# Patient Record
Sex: Female | Born: 1955 | Race: Black or African American | Hispanic: No | Marital: Single | Smoking: Former smoker
Health system: Southern US, Community
[De-identification: ages and names within clinical notes are randomized; demographics above are authoritative.]

## PROBLEM LIST (undated history)

## (undated) DIAGNOSIS — J90 Pleural effusion, not elsewhere classified: Secondary | ICD-10-CM

## (undated) DIAGNOSIS — D259 Leiomyoma of uterus, unspecified: Secondary | ICD-10-CM

## (undated) HISTORY — PX: HERNIA REPAIR: SHX51

---

## 2017-11-26 ENCOUNTER — Other Ambulatory Visit: Payer: Self-pay

## 2017-11-26 ENCOUNTER — Emergency Department (HOSPITAL_BASED_OUTPATIENT_CLINIC_OR_DEPARTMENT_OTHER): Payer: Self-pay

## 2017-11-26 ENCOUNTER — Encounter (HOSPITAL_BASED_OUTPATIENT_CLINIC_OR_DEPARTMENT_OTHER): Payer: Self-pay | Admitting: *Deleted

## 2017-11-26 ENCOUNTER — Emergency Department (HOSPITAL_BASED_OUTPATIENT_CLINIC_OR_DEPARTMENT_OTHER)
Admission: EM | Admit: 2017-11-26 | Discharge: 2017-11-27 | Disposition: A | Discharge status: Home / Self Care | Payer: Self-pay | Attending: Emergency Medicine | Admitting: Emergency Medicine

## 2017-11-26 DIAGNOSIS — R1900 Intra-abdominal and pelvic swelling, mass and lump, unspecified site: Secondary | ICD-10-CM | POA: Insufficient documentation

## 2017-11-26 DIAGNOSIS — J90 Pleural effusion, not elsewhere classified: Secondary | ICD-10-CM | POA: Insufficient documentation

## 2017-11-26 DIAGNOSIS — Z87891 Personal history of nicotine dependence: Secondary | ICD-10-CM | POA: Insufficient documentation

## 2017-11-26 DIAGNOSIS — R180 Malignant ascites: Secondary | ICD-10-CM

## 2017-11-26 LAB — COMPREHENSIVE METABOLIC PANEL
ALBUMIN: 2.6 g/dL — AB (ref 3.5–5.0)
ALK PHOS: 51 U/L (ref 38–126)
ALT: 8 U/L — ABNORMAL LOW (ref 14–54)
AST: 14 U/L — AB (ref 15–41)
Anion gap: 8 (ref 5–15)
BILIRUBIN TOTAL: 0.5 mg/dL (ref 0.3–1.2)
BUN: 12 mg/dL (ref 6–20)
CALCIUM: 8.4 mg/dL — AB (ref 8.9–10.3)
CO2: 24 mmol/L (ref 22–32)
Chloride: 103 mmol/L (ref 101–111)
Creatinine, Ser: 0.89 mg/dL (ref 0.44–1.00)
GFR calc Af Amer: >60 mL/min (ref >60)
GFR calc non Af Amer: >60 mL/min (ref >60)
GLUCOSE: 108 mg/dL — AB (ref 65–99)
Potassium: 3.8 mmol/L (ref 3.5–5.1)
Sodium: 135 mmol/L (ref 135–145)
TOTAL PROTEIN: 9 g/dL — AB (ref 6.5–8.1)

## 2017-11-26 LAB — PROTIME-INR
INR: 1.13
PROTHROMBIN TIME: 14.4 s (ref 11.4–15.2)

## 2017-11-26 LAB — URINALYSIS, ROUTINE W REFLEX MICROSCOPIC
BILIRUBIN URINE: NEGATIVE
Glucose, UA: NEGATIVE mg/dL
Ketones, ur: 15 mg/dL — AB
LEUKOCYTES UA: NEGATIVE
Nitrite: NEGATIVE
PH: 5.5 (ref 5.0–8.0)
Protein, ur: NEGATIVE mg/dL
SPECIFIC GRAVITY, URINE: 1.02 (ref 1.005–1.030)

## 2017-11-26 LAB — URINALYSIS, MICROSCOPIC (REFLEX): WBC UA: NONE SEEN WBC/hpf (ref 0–5)

## 2017-11-26 LAB — CBC WITH DIFFERENTIAL/PLATELET
BASOS PCT: 0 %
Basophils Absolute: 0 10*3/uL (ref 0.0–0.1)
EOS ABS: 0 10*3/uL (ref 0.0–0.7)
Eosinophils Relative: 0 %
HCT: 28.9 % — ABNORMAL LOW (ref 36.0–46.0)
Hemoglobin: 8.8 g/dL — ABNORMAL LOW (ref 12.0–15.0)
LYMPHS PCT: 11 %
Lymphs Abs: 0.8 10*3/uL (ref 0.7–4.0)
MCH: 21.2 pg — AB (ref 26.0–34.0)
MCHC: 30.4 g/dL (ref 30.0–36.0)
MCV: 69.5 fL — AB (ref 78.0–100.0)
MONO ABS: 1.1 10*3/uL — AB (ref 0.1–1.0)
Monocytes Relative: 14 %
NEUTROS ABS: 5.6 10*3/uL (ref 1.7–7.7)
Neutrophils Relative %: 75 %
Platelets: 828 10*3/uL — ABNORMAL HIGH (ref 150–400)
RBC: 4.16 MIL/uL (ref 3.87–5.11)
RDW: 17.6 % — ABNORMAL HIGH (ref 11.5–15.5)
WBC: 7.5 10*3/uL (ref 4.0–10.5)

## 2017-11-26 LAB — BRAIN NATRIURETIC PEPTIDE: B Natriuretic Peptide: 26.7 pg/mL (ref 0.0–100.0)

## 2017-11-26 LAB — TROPONIN I: Troponin I: 0.08 ng/mL (ref <0.03)

## 2017-11-26 LAB — I-STAT CG4 LACTIC ACID, ED: Lactic Acid, Venous: 1.72 mmol/L (ref 0.5–1.9)

## 2017-11-26 MED ORDER — OXYCODONE-ACETAMINOPHEN 5-325 MG PO TABS
1.0000 | ORAL_TABLET | Freq: Once | ORAL | Status: AC
  Administered 2017-11-26: 1 via ORAL
  Filled 2017-11-26: qty 1

## 2017-11-26 MED ORDER — IOPAMIDOL (ISOVUE-370) INJECTION 76%
100.0000 mL | Freq: Once | INTRAVENOUS | Status: AC | PRN
  Administered 2017-11-26: 100 mL via INTRAVENOUS

## 2017-11-26 NOTE — ED Provider Notes (Signed)
[place] EMERGENCY DEPARTMENT Provider Note   CSN: [id number] Arrival date & time: 11/26/17  1830     History   Chief Complaint Chief Complaint  Patient presents with  . Shortness of Breath    HPI Tracy Flynn is a 62 y.o. female.  62 year old female who presents with shortness of breath and back pain.  Patient reports 1 week of progressively worsening shortness of breath as well as left upper back pain.  Shortness of breath is worse with exertion.  She denies any associated chest pain.  No significant cough and no fevers, vomiting, diarrhea, or abdominal pain.  She has some mild lower extremity edema that she has had before, her right leg is always slightly more swollen than her left.  She denies any recent travel, history of blood clots, history of cancer, or OCP use.  She does not follow with a PCP, takes herbs for her health.  She denies any alcohol, tobacco, or drug use.  When asked about her abdominal distention, she states that previously she had gotten big and try to lose weight and now has been trying to gain it back again which is why she explains that her abdomen is so big.  The history is provided by the patient.  Shortness of Breath     History reviewed. No pertinent past medical history.  There are no active problems to display for this patient.   Past Surgical History:  Procedure Laterality Date  . HERNIA REPAIR       OB History   None      Home Medications    Prior to Admission medications   Not on File    Family History History reviewed. No pertinent family history.  Social History Social History   Tobacco Use  . Smoking status: Former Research scientist (life sciences)  . Smokeless tobacco: Never Used  Substance Use Topics  . Alcohol use: Never    Frequency: Never  . Drug use: Never     Allergies   Codeine and Penicillins   Review of Systems Review of Systems  Respiratory: Positive for shortness of breath.    All other systems reviewed and are  negative except that which was mentioned in HPI   Physical Exam Updated Vital Signs BP (!) 180/95 (BP Location: Left Arm)   Pulse (!) 126   Temp 98.3 F (36.8 C)   Resp (!) 25   Ht 5\' 11"  (1.803 m)   Wt 69.4 kg (153 lb)   SpO2 94%   BMI 21.34 kg/m   Physical Exam  Constitutional: She is oriented to person, place, and time. She appears well-developed. No distress.  HENT:  Head: Normocephalic and atraumatic.  Moist mucous membranes  Eyes: Conjunctivae are normal.  Neck: Neck supple.  Cardiovascular: Regular rhythm and normal heart sounds. Tachycardia present.  No murmur heard. Pulmonary/Chest: Effort normal. No accessory muscle usage. Tachypnea noted. She has no wheezes. She has no rales.  Near absent breath sounds in left lung,  Abdominal: Soft. Bowel sounds are normal. She exhibits distension. There is no tenderness.  Distended abdomen with fluid wave, non-tender  Musculoskeletal:       Right lower leg: She exhibits edema.       Left lower leg: She exhibits edema.  1+ pitting edema BLE R>L   Neurological: She is alert and oriented to person, place, and time.  Fluent speech  Skin: Skin is warm and dry.  Psychiatric: Judgment normal.  Flat affect  Nursing note and vitals  reviewed.    ED Treatments / Results  Labs (all labs ordered are listed, but only abnormal results are displayed) Labs Reviewed  COMPREHENSIVE METABOLIC PANEL - Abnormal; Notable for the following components:      Result Value   Glucose, Bld 108 (*)    Calcium 8.4 (*)    Total Protein 9.0 (*)    Albumin 2.6 (*)    AST 14 (*)    ALT 8 (*)    All other components within normal limits  TROPONIN I - Abnormal; Notable for the following components:   Troponin I 0.08 (*)    All other components within normal limits  CBC WITH DIFFERENTIAL/PLATELET - Abnormal; Notable for the following components:   Hemoglobin 8.8 (*)    HCT 28.9 (*)    MCV 69.5 (*)    MCH 21.2 (*)    RDW 17.6 (*)    Platelets 828  (*)    Monocytes Absolute 1.1 (*)    All other components within normal limits  URINALYSIS, ROUTINE W REFLEX MICROSCOPIC - Abnormal; Notable for the following components:   Hgb urine dipstick MODERATE (*)    Ketones, ur 15 (*)    All other components within normal limits  URINALYSIS, MICROSCOPIC (REFLEX) - Abnormal; Notable for the following components:   Bacteria, UA RARE (*)    All other components within normal limits  PROTIME-INR  BRAIN NATRIURETIC PEPTIDE  I-STAT CG4 LACTIC ACID, ED    EKG EKG Interpretation  Date/Time:  Thursday November 26 2017 18:46:00 EDT Ventricular Rate:  125 PR Interval:  148 QRS Duration: 70 QT Interval:  322 QTC Calculation: 464 R Axis:   16 Text Interpretation:  Sinus tachycardia Nonspecific T wave abnormality Abnormal ECG No previous ECGs available Confirmed by Theotis Burrow [phone]) on 11/26/2017 6:48:11 PM   Radiology Dg Chest 2 View  Result Date: 11/26/2017 CLINICAL DATA:  Shortness of breath, dry cough and left chest pain for 1 week. EXAM: CHEST - 2 VIEW COMPARISON:  None. FINDINGS: Complete whiteout of the left chest with mediastinal shift to the right. Right lung is clear. Cardiomediastinal predominantly obscured. No pneumothorax. Scoliosis. Soft tissue planes and included osseous structures are non suspicious. IMPRESSION: Complete white out of left chest with mediastinal shift to the right consistent with effusion and potential underlying mass. Findings would be better assessed on contrast enhanced CT. Acute findings discussed with and reconfirmed by Dr.RACHEL LITTLE on 11/26/2017 at 7:30 pm. Electronically Signed   By: Elon Alas M.D.   On: 11/26/2017 19:30   Ct Angio Chest Pe W/cm &/or Wo Cm  Result Date: 11/26/2017 CLINICAL DATA:  Initial evaluation for possible pulmonary embolism, abdominal swelling. EXAM: CT ANGIOGRAPHY CHEST CT ABDOMEN AND PELVIS WITH CONTRAST TECHNIQUE: Multidetector CT imaging of the chest was performed using the  standard protocol during bolus administration of intravenous contrast. Multiplanar CT image reconstructions and MIPs were obtained to evaluate the vascular anatomy. Multidetector CT imaging of the abdomen and pelvis was performed using the standard protocol during bolus administration of intravenous contrast. CONTRAST:  162mL ISOVUE-370 IOPAMIDOL (ISOVUE-370) INJECTION 76% COMPARISON:  Prior chest radiograph from earlier the same day. FINDINGS: CTA CHEST FINDINGS Cardiovascular: Intrathoracic aorta of normal caliber without aneurysm or other acute abnormality. Visualized great vessels within normal limits. Heart and mediastinal structures shifted to the right. Heart size within normal limits. No definite pericardial effusion. Evaluation of the pulmonary arterial tree somewhat limited due to large left pleural effusion and motion artifact. Evaluation for possible  distal small emboli on this exam limited. Main pulmonary artery within normal limits for caliber measuring 2.6 cm in diameter. No central or large segmental filling defects seen within the right lung to suggest embolus. There is centrally complete collapse of the left lung, which is contracted towards the left hilum. The left pulmonary arteries are largely decompressed. No large central filling defects seen within the left main and proximal pulmonary arteries. Re-formatted imaging confirms these findings. Mediastinum/Nodes: Mediastinal structures shifted to the right due to the large left pleural effusion. Thyroid within normal limits. No definite enlarged mediastinal or hilar adenopathy, although evaluation somewhat limited. No enlarged axillary nodes. Esophagus grossly within normal limits. Lungs/Pleura: Tracheobronchial tree deviated to the right but remains patent as do the bronchi supplying the right lung there is compression of the distal left mainstem and segmental bronchi at the left hilum with complete compression of the left lung. Large pleural  effusion fills the left hemithorax. Effusion measures simple fluid density without internal complexity or loculation. Smaller layering right pleural effusion present as well. The pneumatized right lung is otherwise clear without infiltrate or edema. No pneumothorax. No visible pulmonary nodule or mass. Musculoskeletal: Anasarca noted. Dextroscoliosis with exaggeration of the normal thoracic kyphosis. Degenerative changes noted within the lower cervical spine. No acute osseous abnormality. No worrisome lytic or blastic osseous lesions. Review of the MIP images confirms the above findings. CT ABDOMEN and PELVIS FINDINGS Hepatobiliary: Multiple heterogeneous hypodense lesions present within the liver, largest of which is hypodense with irregular nodular enhancement measuring 3.5 x 4.0 x 1.9 cm (series 13, image 14). Findings are indeterminate, but concerning for possible metastatic disease. Irregular subcapsular lesions with irregularity of the overlying right hepatic contour noted. Periportal edema. Gallbladder within normal limits. No appreciable biliary dilatation. Pancreas: Pancreas within normal limits. Spleen: Spleen displaced anteriorly but otherwise unremarkable. Adrenals/Urinary Tract: Adrenal glands grossly within normal limits. Kidneys equal in size with symmetric enhancement. Few scattered hypodense lesions within the left kidney too small the characterize. No nephrolithiasis, hydronephrosis, or focal enhancing renal mass. No appreciable hydroureter. Partially distended bladder demonstrates no acute abnormality. Stomach/Bowel: Stomach within normal limits. Small paraumbilical hernia containing short segment loop of small bowel and free fluid noted without associated obstruction. No definite acute inflammatory changes seen about the bowels. Vascular/Lymphatic: Normal intravascular enhancement seen throughout the intra-abdominal aorta. Mesenteric vessels patent proximally. Multiple mesenteric lymph nodes  and/or peritoneal implants present within the right mid abdomen, measuring up to 12 mm (series 13, image 38). Shoddy subcentimeter retroperitoneal lymph nodes noted. Reproductive: Large partially solid/partially cystic mass position within the central pelvis and lower abdomen measures 17.6 x 20.0 x 20.0 cm (series 13, image 63). Lesion demonstrates internal cystic components with superimposed large eccentric solid slight soft tissue components. Scattered internal calcifications. 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 Additional right adnexal cystic lesions measure up to 3.7 cm (series 15, image 76), indeterminate. Uterus seen inferiorly and is grossly within normal limits. The native ovaries not well delineated. Other: Large volume free fluid throughout the abdomen and pelvis, measuring simple fluid density. No free intraperitoneal air. Musculoskeletal: Diffuse anasarca. Patient overall somewhat cachectic in appearance. Scoliosis with advanced facet arthrosis noted within the lower lumbar spine. No acute osseous abnormality. No discrete lytic or blastic osseous lesions. Review of the MIP images confirms the above findings. IMPRESSION: CTA CHEST IMPRESSION 1. Mildly limited study due to large left pleural effusion and motion artifact. No CTA evidence for large or central pulmonary embolus. Evaluation for possible  distal small emboli 2. Large left pleural effusion with complete collapse of the left lung with left-to-right mediastinal shift. 3. Smaller layering right pleural effusion. CT ABDOMEN AND PELVIS IMPRESSION 1. 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 2. Associated large volume ascites throughout the abdomen and pelvis. 3. Multiple heterogeneous hepatic lesions, concerning for possible metastatic disease. 4. Few scattered mildly enlarged mesenteric lymph nodes, which could be reactive or  reflect nodal metastases. 5. Additional smaller right adnexal cystic lesions measuring up to 3.7 cm, indeterminate. 6. Anasarca. Electronically Signed   By: Jeannine Boga M.D.   On: 11/26/2017 21:53   Ct Abdomen Pelvis W Contrast  Result Date: 11/26/2017 CLINICAL DATA:  Initial evaluation for possible pulmonary embolism, abdominal swelling. EXAM: CT ANGIOGRAPHY CHEST CT ABDOMEN AND PELVIS WITH CONTRAST TECHNIQUE: Multidetector CT imaging of the chest was performed using the standard protocol during bolus administration of intravenous contrast. Multiplanar CT image reconstructions and MIPs were obtained to evaluate the vascular anatomy. Multidetector CT imaging of the abdomen and pelvis was performed using the standard protocol during bolus administration of intravenous contrast. CONTRAST:  173mL ISOVUE-370 IOPAMIDOL (ISOVUE-370) INJECTION 76% COMPARISON:  Prior chest radiograph from earlier the same day. FINDINGS: CTA CHEST FINDINGS Cardiovascular: Intrathoracic aorta of normal caliber without aneurysm or other acute abnormality. Visualized great vessels within normal limits. Heart and mediastinal structures shifted to the right. Heart size within normal limits. No definite pericardial effusion. Evaluation of the pulmonary arterial tree somewhat limited due to large left pleural effusion and motion artifact. Evaluation for possible distal small emboli on this exam limited. Main pulmonary artery within normal limits for caliber measuring 2.6 cm in diameter. No central or large segmental filling defects seen within the right lung to suggest embolus. There is centrally complete collapse of the left lung, which is contracted towards the left hilum. The left pulmonary arteries are largely decompressed. No large central filling defects seen within the left main and proximal pulmonary arteries. Re-formatted imaging confirms these findings. Mediastinum/Nodes: Mediastinal structures shifted to the right due to the  large left pleural effusion. Thyroid within normal limits. No definite enlarged mediastinal or hilar adenopathy, although evaluation somewhat limited. No enlarged axillary nodes. Esophagus grossly within normal limits. Lungs/Pleura: Tracheobronchial tree deviated to the right but remains patent as do the bronchi supplying the right lung there is compression of the distal left mainstem and segmental bronchi at the left hilum with complete compression of the left lung. Large pleural effusion fills the left hemithorax. Effusion measures simple fluid density without internal complexity or loculation. Smaller layering right pleural effusion present as well. The pneumatized right lung is otherwise clear without infiltrate or edema. No pneumothorax. No visible pulmonary nodule or mass. Musculoskeletal: Anasarca noted. Dextroscoliosis with exaggeration of the normal thoracic kyphosis. Degenerative changes noted within the lower cervical spine. No acute osseous abnormality. No worrisome lytic or blastic osseous lesions. Review of the MIP images confirms the above findings. CT ABDOMEN and PELVIS FINDINGS Hepatobiliary: Multiple heterogeneous hypodense lesions present within the liver, largest of which is hypodense with irregular nodular enhancement measuring 3.5 x 4.0 x 1.9 cm (series 13, image 14). Findings are indeterminate, but concerning for possible metastatic disease. Irregular subcapsular lesions with irregularity of the overlying right hepatic contour noted. Periportal edema. Gallbladder within normal limits. No appreciable biliary dilatation. Pancreas: Pancreas within normal limits. Spleen: Spleen displaced anteriorly but otherwise unremarkable. Adrenals/Urinary Tract: Adrenal  glands grossly within normal limits. Kidneys equal in size with symmetric enhancement. Few scattered hypodense lesions within the left kidney too small the characterize. No nephrolithiasis, hydronephrosis, or focal enhancing renal mass. No  appreciable hydroureter. Partially distended bladder demonstrates no acute abnormality. Stomach/Bowel: Stomach within normal limits. Small paraumbilical hernia containing short segment loop of small bowel and free fluid noted without associated obstruction. No definite acute inflammatory changes seen about the bowels. Vascular/Lymphatic: Normal intravascular enhancement seen throughout the intra-abdominal aorta. Mesenteric vessels patent proximally. Multiple mesenteric lymph nodes and/or peritoneal implants present within the right mid abdomen, measuring up to 12 mm (series 13, image 38). Shoddy subcentimeter retroperitoneal lymph nodes noted. Reproductive: Large partially solid/partially cystic mass position within the central pelvis and lower abdomen measures 17.6 x 20.0 x 20.0 cm (series 13, image 63). Lesion demonstrates internal cystic components with superimposed large eccentric solid slight soft tissue components. Scattered internal calcifications. 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 Additional right adnexal cystic lesions measure up to 3.7 cm (series 15, image 76), indeterminate. Uterus seen inferiorly and is grossly within normal limits. The native ovaries not well delineated. Other: Large volume free fluid throughout the abdomen and pelvis, measuring simple fluid density. No free intraperitoneal air. Musculoskeletal: Diffuse anasarca. Patient overall somewhat cachectic in appearance. Scoliosis with advanced facet arthrosis noted within the lower lumbar spine. No acute osseous abnormality. No discrete lytic or blastic osseous lesions. Review of the MIP images confirms the above findings. IMPRESSION: CTA CHEST IMPRESSION 1. Mildly limited study due to large left pleural effusion and motion artifact. No CTA evidence for large or central pulmonary embolus. Evaluation for possible distal small emboli 2. Large left pleural effusion with complete collapse of the left lung with left-to-right  mediastinal shift. 3. Smaller layering right pleural effusion. CT ABDOMEN AND PELVIS IMPRESSION 1. 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 2. Associated large volume ascites throughout the abdomen and pelvis. 3. Multiple heterogeneous hepatic lesions, concerning for possible metastatic disease. 4. Few scattered mildly enlarged mesenteric lymph nodes, which could be reactive or reflect nodal metastases. 5. Additional smaller right adnexal cystic lesions measuring up to 3.7 cm, indeterminate. 6. Anasarca. Electronically Signed   By: Jeannine Boga M.D.   On: 11/26/2017 21:53    Procedures Procedures (including critical care time)  Medications Ordered in ED Medications  iopamidol (ISOVUE-370) 76 % injection 100 mL (100 mLs Intravenous Contrast Given 11/26/17 2042)  oxyCODONE-acetaminophen (PERCOCET/ROXICET) 5-325 MG per tablet 1 tablet (1 tablet Oral Given 11/26/17 2144)     Initial Impression / Assessment and Plan / ED Course  I have reviewed the triage vital signs and the nursing notes.  Pertinent labs & imaging results that were available during my care of the patient were reviewed by me and considered in my medical decision making (see chart for details).    PT was tachycardic, tachypneic on exam, hypertensive, afebrile, no severe respiratory distress, O2 sats mid to low 90s on RA.  Her protuberant abdomen with ascites was immediately noticeable on exam, concerning for possible malignancy.  Absent lung sounds on left side.  EKG without acute ischemic changes.  Chest x-ray shows whiteout of left lung.  Obtain CTA of chest as well as CT abdomen and pelvis to evaluate for malignancy.  Labs show hemoglobin 8.8, platelets 828, troponin 0.08, normal LFTs, lactate, calcium 8.4.  Does large left pleural effusion with left lung collapse  and left to right mediastinal shift; smaller right pleural  effusion; large cystic mass concerning for ovarian neoplasm with associated ascites, anasarca, hepatic lesions resembling metastatic disease, and multiple enlarged lymph nodes.  I had a long discussion with the patient regarding her imaging findings and explained that I am very concerned about metastatic ovarian cancer although we will need more studies to make the diagnosis.  I recommended transfer to [facility] for thoracentesis and further work-up.  The patient declined admission. I explained that she may die from her life-threatening pleural effusion and I implored her to stay but she still requested to leave despite voicing understanding of risks.  I instructed her to report directly to [facility] for admission as soon as she was able.  Patient left AMA. Final Clinical Impressions(s) / ED Diagnoses   Final diagnoses:  Pleural effusion on left  Abdominal mass, unspecified abdominal location  Malignant ascites    ED Discharge Orders    None       Little, Wenda Overland, MD 11/27/17 [phone]

## 2017-11-26 NOTE — ED Notes (Signed)
Pt states she has not been to see a Dr in a while and pt takes "herbs" for her health. Pt states its a proprietary blend and does not know what herbs and supplements are contained within them.

## 2017-11-26 NOTE — ED Triage Notes (Signed)
Pt c/o SOB and upper back pain x 1 week

## 2017-11-26 NOTE — ED Notes (Signed)
Date and time results received: 11/26/17 2013 (use smartphrase ".now" to insert current time)  Test: troponin Critical Value: 0.08  Name of Provider Notified: Dr. Rex Kras  Orders Received? Or Actions Taken?: no new orders at this time.

## 2017-11-26 NOTE — Discharge Instructions (Addendum)
YOUR LEFT LUNG HAS FLUID THAT NEEDS TO BE DRAINED AS SOON AS POSSIBLE. YOU HAVE A TUMOR IN YOUR ABDOMEN THAT NEEDS FURTHER EVALUATION. GO STRAIGHT TO [place] OR THE NEAREST HOSPITAL AS SOON AS POSSIBLE TO BE ADMITTED FOR THESE TESTS AND TREATMENTS.

## 2017-11-27 ENCOUNTER — Emergency Department (HOSPITAL_COMMUNITY): Payer: Self-pay

## 2017-11-27 ENCOUNTER — Inpatient Hospital Stay (HOSPITAL_COMMUNITY): Payer: Self-pay

## 2017-11-27 ENCOUNTER — Encounter (HOSPITAL_COMMUNITY): Payer: Self-pay

## 2017-11-27 ENCOUNTER — Inpatient Hospital Stay (HOSPITAL_COMMUNITY)
Admission: EM | Admit: 2017-11-27 | Discharge: 2017-11-28 | DRG: 181 | Disposition: A | Discharge status: Home / Self Care | Payer: Self-pay | Attending: Internal Medicine | Admitting: Internal Medicine

## 2017-11-27 ENCOUNTER — Other Ambulatory Visit: Payer: Self-pay

## 2017-11-27 DIAGNOSIS — D630 Anemia in neoplastic disease: Secondary | ICD-10-CM | POA: Diagnosis present

## 2017-11-27 DIAGNOSIS — N950 Postmenopausal bleeding: Secondary | ICD-10-CM | POA: Diagnosis present

## 2017-11-27 DIAGNOSIS — R590 Localized enlarged lymph nodes: Secondary | ICD-10-CM

## 2017-11-27 DIAGNOSIS — D473 Essential (hemorrhagic) thrombocythemia: Secondary | ICD-10-CM

## 2017-11-27 DIAGNOSIS — Z87891 Personal history of nicotine dependence: Secondary | ICD-10-CM

## 2017-11-27 DIAGNOSIS — K769 Liver disease, unspecified: Secondary | ICD-10-CM | POA: Diagnosis present

## 2017-11-27 DIAGNOSIS — R160 Hepatomegaly, not elsewhere classified: Secondary | ICD-10-CM | POA: Diagnosis present

## 2017-11-27 DIAGNOSIS — J910 Malignant pleural effusion: Principal | ICD-10-CM | POA: Diagnosis present

## 2017-11-27 DIAGNOSIS — C541 Malignant neoplasm of endometrium: Secondary | ICD-10-CM | POA: Diagnosis present

## 2017-11-27 DIAGNOSIS — I248 Other forms of acute ischemic heart disease: Secondary | ICD-10-CM | POA: Diagnosis present

## 2017-11-27 DIAGNOSIS — R188 Other ascites: Secondary | ICD-10-CM | POA: Diagnosis present

## 2017-11-27 DIAGNOSIS — R000 Tachycardia, unspecified: Secondary | ICD-10-CM

## 2017-11-27 DIAGNOSIS — C801 Malignant (primary) neoplasm, unspecified: Secondary | ICD-10-CM

## 2017-11-27 DIAGNOSIS — J90 Pleural effusion, not elsewhere classified: Secondary | ICD-10-CM

## 2017-11-27 DIAGNOSIS — D4959 Neoplasm of unspecified behavior of other genitourinary organ: Secondary | ICD-10-CM

## 2017-11-27 DIAGNOSIS — D649 Anemia, unspecified: Secondary | ICD-10-CM

## 2017-11-27 DIAGNOSIS — D75839 Thrombocytosis, unspecified: Secondary | ICD-10-CM

## 2017-11-27 DIAGNOSIS — Z9889 Other specified postprocedural states: Secondary | ICD-10-CM

## 2017-11-27 DIAGNOSIS — D259 Leiomyoma of uterus, unspecified: Secondary | ICD-10-CM | POA: Diagnosis present

## 2017-11-27 DIAGNOSIS — R7989 Other specified abnormal findings of blood chemistry: Secondary | ICD-10-CM | POA: Diagnosis present

## 2017-11-27 DIAGNOSIS — N83209 Unspecified ovarian cyst, unspecified side: Secondary | ICD-10-CM

## 2017-11-27 HISTORY — DX: Leiomyoma of uterus, unspecified: D25.9

## 2017-11-27 LAB — CBC
HCT: 27.3 % — ABNORMAL LOW (ref 36.0–46.0)
Hemoglobin: 8.2 g/dL — ABNORMAL LOW (ref 12.0–15.0)
MCH: 21.6 pg — AB (ref 26.0–34.0)
MCHC: 30 g/dL (ref 30.0–36.0)
MCV: 71.8 fL — AB (ref 78.0–100.0)
Platelets: 757 10*3/uL — ABNORMAL HIGH (ref 150–400)
RBC: 3.8 MIL/uL — ABNORMAL LOW (ref 3.87–5.11)
RDW: 17.2 % — AB (ref 11.5–15.5)
WBC: 7.7 10*3/uL (ref 4.0–10.5)

## 2017-11-27 LAB — BODY FLUID CELL COUNT WITH DIFFERENTIAL
Eos, Fluid: 0 %
LYMPHS FL: 59 %
Monocyte-Macrophage-Serous Fluid: 34 % — ABNORMAL LOW (ref 50–90)
NEUTROPHIL FLUID: 7 % (ref 0–25)
WBC FLUID: 1293 uL — AB (ref 0–1000)

## 2017-11-27 LAB — COMPREHENSIVE METABOLIC PANEL
ALT: 9 U/L — ABNORMAL LOW (ref 14–54)
AST: 13 U/L — ABNORMAL LOW (ref 15–41)
Albumin: 2.5 g/dL — ABNORMAL LOW (ref 3.5–5.0)
Alkaline Phosphatase: 44 U/L (ref 38–126)
Anion gap: 9 (ref 5–15)
BUN: 11 mg/dL (ref 6–20)
CHLORIDE: 104 mmol/L (ref 101–111)
CO2: 25 mmol/L (ref 22–32)
Calcium: 8.7 mg/dL — ABNORMAL LOW (ref 8.9–10.3)
Creatinine, Ser: 0.72 mg/dL (ref 0.44–1.00)
Glucose, Bld: 102 mg/dL — ABNORMAL HIGH (ref 65–99)
POTASSIUM: 4.4 mmol/L (ref 3.5–5.1)
Sodium: 138 mmol/L (ref 135–145)
Total Bilirubin: 0.5 mg/dL (ref 0.3–1.2)
Total Protein: 8.3 g/dL — ABNORMAL HIGH (ref 6.5–8.1)

## 2017-11-27 LAB — PROTEIN, PLEURAL OR PERITONEAL FLUID: TOTAL PROTEIN, FLUID: 6 g/dL

## 2017-11-27 LAB — IRON AND TIBC
IRON: 10 ug/dL — AB (ref 28–170)
SATURATION RATIOS: 6 % — AB (ref 10.4–31.8)
TIBC: 167 ug/dL — ABNORMAL LOW (ref 250–450)
UIBC: 157 ug/dL

## 2017-11-27 LAB — LACTATE DEHYDROGENASE, PLEURAL OR PERITONEAL FLUID: LD, Fluid: 990 U/L — ABNORMAL HIGH (ref 3–23)

## 2017-11-27 LAB — FERRITIN: FERRITIN: 367 ng/mL — AB (ref 11–307)

## 2017-11-27 LAB — LACTIC ACID, PLASMA: Lactic Acid, Venous: 1.2 mmol/L (ref 0.5–1.9)

## 2017-11-27 LAB — TROPONIN I
Troponin I: 0.08 ng/mL (ref <0.03)
Troponin I: 0.08 ng/mL (ref <0.03)

## 2017-11-27 MED ORDER — LIDOCAINE HCL 1 % IJ SOLN
INTRAMUSCULAR | Status: AC
  Filled 2017-11-27: qty 10

## 2017-11-27 MED ORDER — ENOXAPARIN SODIUM 40 MG/0.4ML ~~LOC~~ SOLN: 40.0000 mg | SUBCUTANEOUS | Status: DC

## 2017-11-27 MED ORDER — ONDANSETRON HCL 4 MG/2ML IJ SOLN: 4.0000 mg | Freq: Four times a day (QID) | INTRAMUSCULAR | Status: DC | PRN

## 2017-11-27 MED ORDER — MORPHINE SULFATE (PF) 2 MG/ML IV SOLN: 2.0000 mg | INTRAVENOUS | Status: DC | PRN

## 2017-11-27 NOTE — ED Triage Notes (Addendum)
Patient reports that she went to [place] last night for SOB. Patient states she was told she had fluid on her lungs and told her she needed to come to [place] last night. Patient here today. Patient  States she had an x-ray and CT Scan yesterday as well.

## 2017-11-27 NOTE — H&P (Addendum)
History and Physical    Tracy Flynn YTK:[id number] DOB: Jun 09, 1956 DOA: 11/27/2017  PCP: Patient, No Pcp Per   Patient coming from: Home    Chief Complaint: Shortness of breath  HPI: Tracy Flynn is a 62 y.o. female with history of uterine fibroid who comes to the emergency department with complaints of shortness of breath.  Patient was at emergency department at [facility] yesterday and was found to have large left-sided pleural effusion but she left AGAINST MEDICAL ADVICE.  She presented to [facility] again with shortness of breath and was recommended to go emergency department at [facility]. Patient denies any significant past medical history except for uterine fibroid diagnosed several years ago but was not treated.  She has not seen any doctors for last several years.  She lives by herself.  She does not take any medications.  She works in Teacher, music.  She denies smoking or drinking. Patient has been having progressive shortness of breath since last Monday. She says she cannot walk more than few steps until she gets short of breath and has to rest.  She was also complaining of back pain on her left side and has noticed progressive swelling of her abdomen she says.  She has lost 25 to 30 pounds since last December but states she intentionally lost it by dieting. Patient was found to be in sinus tachycardia on presentation.  Imaging showed large left-sided pleural effusion with almost complete opacification on the left side with mediastinal shift.  CT imaging showed large mass within the pelvis concerning for ovarian neoplasm, ascites, multiple hepatic lesions consistent with metastatic disease, mesenteric lymphadenopathy.  All of these findings were concerning for metastatic disease. Patient seen and examined the bedside in the emergency department.  She was hemodynamically except for mild sinus tachycardia.  She is not in respiratory distress.  She denies any chest pain,  palpitations, abdominal pain, dysuria, nausea, vomiting, diarrhea, headache fever chills. Patient was repeatedly wishing to go home after thoracocentesis but agreed to stay and admitted after repeated counseling.  ED Course: Gynecology-oncology was consulted and and decided that they will follow-up here.  IR guided thoracocentesis ordered.  Review of Systems: As per HPI otherwise 10 point review of systems negative.    Past Medical History:  Diagnosis Date  . Uterine fibroid     Past Surgical History:  Procedure Laterality Date  . HERNIA REPAIR       reports that she has quit smoking. She has never used smokeless tobacco. She reports that she does not drink alcohol or use drugs.  Allergies  Allergen Reactions  . Codeine   . Penicillins     Has patient had a PCN reaction causing immediate rash, facial/tongue/throat swelling, SOB or lightheadedness with hypotension: {Yes Has patient had a PCN reaction causing severe rash involving mucus membranes or skin necrosis:No Has patient had a PCN reaction that required hospitalization: No Has patient had a PCN reaction occurring within the last 10 years: No If all of the above answers are "NO", then may proceed with Cephalosporin use.     Family History  Problem Relation Age of Onset  . Cancer Father      Prior to Admission medications   Medication Sig Start Date End Date Taking? Authorizing Provider  ibuprofen (ADVIL,MOTRIN) 200 MG tablet Take 200 mg by mouth daily as needed (PAIN).   Yes [provider]  OVER THE COUNTER MEDICATION Take 2 tablets by mouth at bedtime.  Yes [provider]    Physical Exam: Vitals:   11/27/17 1014 11/27/17 1017 11/27/17 1220  BP: (!) 145/90  132/90  Pulse: (!) 125  (!) 118  Resp: (!) 22  16  Temp: 98.6 F (37 C)    TempSrc: Oral    SpO2: 98%  99%  Weight:  69.4 kg (153 lb)   Height:  5\' 11"  (1.803 m)     Constitutional: NAD, calm, comfortable,thin Vitals:   11/27/17  1014 11/27/17 1017 11/27/17 1220  BP: (!) 145/90  132/90  Pulse: (!) 125  (!) 118  Resp: (!) 22  16  Temp: 98.6 F (37 C)    TempSrc: Oral    SpO2: 98%  99%  Weight:  69.4 kg (153 lb)   Height:  5\' 11"  (1.803 m)    Eyes: PERRL, lids and conjunctivae normal ENMT: Mucous membranes are moist. Posterior pharynx clear of any exudate or lesions.Normal dentition.  Neck: normal, supple, no masses, no thyromegaly Respiratory: Decreased air entry on the left side Cardiovascular: Regular rate and rhythm, no murmurs / rubs / gallops. No extremity edema. 1+ pedal pulses. No carotid bruits.  Abdomen: Distended, no tenderness, protusion of umbilicus Musculoskeletal: no clubbing / cyanosis. No joint deformity upper and lower extremities. Good ROM, no contractures. Normal muscle tone.  Skin: no rashes, lesions, ulcers. No induration Neurologic: CN 2-12 grossly intact. Sensation intact, DTR normal. Strength 5/5 in all 4.  Psychiatric: Normal judgment and insight. Alert and oriented x 3. Normal mood.   Foley Catheter:None  Labs on Admission: I have personally reviewed following labs and imaging studies  CBC: Recent Labs  Lab 11/26/17 1921 11/27/17 1244  WBC 7.5 7.7  NEUTROABS 5.6  --   HGB 8.8* 8.2*  HCT 28.9* 27.3*  MCV 69.5* 71.8*  PLT 828* 323*   Basic Metabolic Panel: Recent Labs  Lab 11/26/17 1921 11/27/17 1244  NA 135 138  K 3.8 4.4  CL 103 104  CO2 24 25  GLUCOSE 108* 102*  BUN 12 11  CREATININE 0.89 0.72  CALCIUM 8.4* 8.7*   GFR: Estimated Creatinine Clearance: 79.9 mL/min (by C-G formula based on SCr of 0.72 mg/dL). Liver Function Tests: Recent Labs  Lab 11/26/17 1921 11/27/17 1244  AST 14* 13*  ALT 8* 9*  ALKPHOS 51 44  BILITOT 0.5 0.5  PROT 9.0* 8.3*  ALBUMIN 2.6* 2.5*   No results for input(s): LIPASE, AMYLASE in the last 168 hours. No results for input(s): AMMONIA in the last 168 hours. Coagulation Profile: Recent Labs  Lab 11/26/17 1921  INR 1.13    Cardiac Enzymes: Recent Labs  Lab 11/26/17 1921  TROPONINI 0.08*   BNP (last 3 results) No results for input(s): PROBNP in the last 8760 hours. HbA1C: No results for input(s): HGBA1C in the last 72 hours. CBG: No results for input(s): GLUCAP in the last 168 hours. Lipid Profile: No results for input(s): CHOL, HDL, LDLCALC, TRIG, CHOLHDL, LDLDIRECT in the last 72 hours. Thyroid Function Tests: No results for input(s): TSH, T4TOTAL, FREET4, T3FREE, THYROIDAB in the last 72 hours. Anemia Panel: No results for input(s): VITAMINB12, FOLATE, FERRITIN, TIBC, IRON, RETICCTPCT in the last 72 hours. Urine analysis:    Component Value Date/Time   COLORURINE YELLOW 11/26/2017 1906   APPEARANCEUR CLEAR 11/26/2017 1906   LABSPEC 1.020 11/26/2017 1906   PHURINE 5.5 11/26/2017 1906   GLUCOSEU NEGATIVE 11/26/2017 1906   HGBUR MODERATE (A) 11/26/2017 [place] NEGATIVE 11/26/2017 1906  KETONESUR 15 (A) 11/26/2017 1906   PROTEINUR NEGATIVE 11/26/2017 1906   NITRITE NEGATIVE 11/26/2017 1906   LEUKOCYTESUR NEGATIVE 11/26/2017 1906    Radiological Exams on Admission: Dg Chest 2 View  Result Date: 11/26/2017 CLINICAL DATA:  Shortness of breath, dry cough and left chest pain for 1 week. EXAM: CHEST - 2 VIEW COMPARISON:  None. FINDINGS: Complete whiteout of the left chest with mediastinal shift to the right. Right lung is clear. Cardiomediastinal predominantly obscured. No pneumothorax. Scoliosis. Soft tissue planes and included osseous structures are non suspicious. IMPRESSION: Complete white out of left chest with mediastinal shift to the right consistent with effusion and potential underlying mass. Findings would be better assessed on contrast enhanced CT. Acute findings discussed with and reconfirmed by Dr.RACHEL LITTLE on 11/26/2017 at 7:30 pm. Electronically Signed   By: Elon Alas M.D.   On: 11/26/2017 19:30   Ct Angio Chest Pe W/cm &/or Wo Cm  Result Date: 11/26/2017 CLINICAL  DATA:  Initial evaluation for possible pulmonary embolism, abdominal swelling. EXAM: CT ANGIOGRAPHY CHEST CT ABDOMEN AND PELVIS WITH CONTRAST TECHNIQUE: Multidetector CT imaging of the chest was performed using the standard protocol during bolus administration of intravenous contrast. Multiplanar CT image reconstructions and MIPs were obtained to evaluate the vascular anatomy. Multidetector CT imaging of the abdomen and pelvis was performed using the standard protocol during bolus administration of intravenous contrast. CONTRAST:  129mL ISOVUE-370 IOPAMIDOL (ISOVUE-370) INJECTION 76% COMPARISON:  Prior chest radiograph from earlier the same day. FINDINGS: CTA CHEST FINDINGS Cardiovascular: Intrathoracic aorta of normal caliber without aneurysm or other acute abnormality. Visualized great vessels within normal limits. Heart and mediastinal structures shifted to the right. Heart size within normal limits. No definite pericardial effusion. Evaluation of the pulmonary arterial tree somewhat limited due to large left pleural effusion and motion artifact. Evaluation for possible distal small emboli on this exam limited. Main pulmonary artery within normal limits for caliber measuring 2.6 cm in diameter. No central or large segmental filling defects seen within the right lung to suggest embolus. There is centrally complete collapse of the left lung, which is contracted towards the left hilum. The left pulmonary arteries are largely decompressed. No large central filling defects seen within the left main and proximal pulmonary arteries. Re-formatted imaging confirms these findings. Mediastinum/Nodes: Mediastinal structures shifted to the right due to the large left pleural effusion. Thyroid within normal limits. No definite enlarged mediastinal or hilar adenopathy, although evaluation somewhat limited. No enlarged axillary nodes. Esophagus grossly within normal limits. Lungs/Pleura: Tracheobronchial tree deviated to the  right but remains patent as do the bronchi supplying the right lung there is compression of the distal left mainstem and segmental bronchi at the left hilum with complete compression of the left lung. Large pleural effusion fills the left hemithorax. Effusion measures simple fluid density without internal complexity or loculation. Smaller layering right pleural effusion present as well. The pneumatized right lung is otherwise clear without infiltrate or edema. No pneumothorax. No visible pulmonary nodule or mass. Musculoskeletal: Anasarca noted. Dextroscoliosis with exaggeration of the normal thoracic kyphosis. Degenerative changes noted within the lower cervical spine. No acute osseous abnormality. No worrisome lytic or blastic osseous lesions. Review of the MIP images confirms the above findings. CT ABDOMEN and PELVIS FINDINGS Hepatobiliary: Multiple heterogeneous hypodense lesions present within the liver, largest of which is hypodense with irregular nodular enhancement measuring 3.5 x 4.0 x 1.9 cm (series 13, image 14). Findings are indeterminate, but concerning for possible metastatic disease. Irregular subcapsular  lesions with irregularity of the overlying right hepatic contour noted. Periportal edema. Gallbladder within normal limits. No appreciable biliary dilatation. Pancreas: Pancreas within normal limits. Spleen: Spleen displaced anteriorly but otherwise unremarkable. Adrenals/Urinary Tract: Adrenal glands grossly within normal limits. Kidneys equal in size with symmetric enhancement. Few scattered hypodense lesions within the left kidney too small the characterize. No nephrolithiasis, hydronephrosis, or focal enhancing renal mass. No appreciable hydroureter. Partially distended bladder demonstrates no acute abnormality. Stomach/Bowel: Stomach within normal limits. Small paraumbilical hernia containing short segment loop of small bowel and free fluid noted without associated obstruction. No definite acute  inflammatory changes seen about the bowels. Vascular/Lymphatic: Normal intravascular enhancement seen throughout the intra-abdominal aorta. Mesenteric vessels patent proximally. Multiple mesenteric lymph nodes and/or peritoneal implants present within the right mid abdomen, measuring up to 12 mm (series 13, image 38). Shoddy subcentimeter retroperitoneal lymph nodes noted. Reproductive: Large partially solid/partially cystic mass position within the central pelvis and lower abdomen measures 17.6 x 20.0 x 20.0 cm (series 13, image 63). Lesion demonstrates internal cystic components with superimposed large eccentric solid slight soft tissue components. Scattered internal calcifications. 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 Additional right adnexal cystic lesions measure up to 3.7 cm (series 15, image 76), indeterminate. Uterus seen inferiorly and is grossly within normal limits. The native ovaries not well delineated. Other: Large volume free fluid throughout the abdomen and pelvis, measuring simple fluid density. No free intraperitoneal air. Musculoskeletal: Diffuse anasarca. Patient overall somewhat cachectic in appearance. Scoliosis with advanced facet arthrosis noted within the lower lumbar spine. No acute osseous abnormality. No discrete lytic or blastic osseous lesions. Review of the MIP images confirms the above findings. IMPRESSION: CTA CHEST IMPRESSION 1. Mildly limited study due to large left pleural effusion and motion artifact. No CTA evidence for large or central pulmonary embolus. Evaluation for possible distal small emboli 2. Large left pleural effusion with complete collapse of the left lung with left-to-right mediastinal shift. 3. Smaller layering right pleural effusion. CT ABDOMEN AND PELVIS IMPRESSION 1. 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 2. Associated large volume ascites  throughout the abdomen and pelvis. 3. Multiple heterogeneous hepatic lesions, concerning for possible metastatic disease. 4. Few scattered mildly enlarged mesenteric lymph nodes, which could be reactive or reflect nodal metastases. 5. Additional smaller right adnexal cystic lesions measuring up to 3.7 cm, indeterminate. 6. Anasarca. Electronically Signed   By: Jeannine Boga M.D.   On: 11/26/2017 21:53   Ct Abdomen Pelvis W Contrast  Result Date: 11/26/2017 CLINICAL DATA:  Initial evaluation for possible pulmonary embolism, abdominal swelling. EXAM: CT ANGIOGRAPHY CHEST CT ABDOMEN AND PELVIS WITH CONTRAST TECHNIQUE: Multidetector CT imaging of the chest was performed using the standard protocol during bolus administration of intravenous contrast. Multiplanar CT image reconstructions and MIPs were obtained to evaluate the vascular anatomy. Multidetector CT imaging of the abdomen and pelvis was performed using the standard protocol during bolus administration of intravenous contrast. CONTRAST:  180mL ISOVUE-370 IOPAMIDOL (ISOVUE-370) INJECTION 76% COMPARISON:  Prior chest radiograph from earlier the same day. FINDINGS: CTA CHEST FINDINGS Cardiovascular: Intrathoracic aorta of normal caliber without aneurysm or other acute abnormality. Visualized great vessels within normal limits. Heart and mediastinal structures shifted to the right. Heart size within normal limits. No definite pericardial effusion. Evaluation of the pulmonary arterial tree somewhat limited due to large left pleural effusion and motion artifact. Evaluation for possible distal small emboli  on this exam limited. Main pulmonary artery within normal limits for caliber measuring 2.6 cm in diameter. No central or large segmental filling defects seen within the right lung to suggest embolus. There is centrally complete collapse of the left lung, which is contracted towards the left hilum. The left pulmonary arteries are largely decompressed. No  large central filling defects seen within the left main and proximal pulmonary arteries. Re-formatted imaging confirms these findings. Mediastinum/Nodes: Mediastinal structures shifted to the right due to the large left pleural effusion. Thyroid within normal limits. No definite enlarged mediastinal or hilar adenopathy, although evaluation somewhat limited. No enlarged axillary nodes. Esophagus grossly within normal limits. Lungs/Pleura: Tracheobronchial tree deviated to the right but remains patent as do the bronchi supplying the right lung there is compression of the distal left mainstem and segmental bronchi at the left hilum with complete compression of the left lung. Large pleural effusion fills the left hemithorax. Effusion measures simple fluid density without internal complexity or loculation. Smaller layering right pleural effusion present as well. The pneumatized right lung is otherwise clear without infiltrate or edema. No pneumothorax. No visible pulmonary nodule or mass. Musculoskeletal: Anasarca noted. Dextroscoliosis with exaggeration of the normal thoracic kyphosis. Degenerative changes noted within the lower cervical spine. No acute osseous abnormality. No worrisome lytic or blastic osseous lesions. Review of the MIP images confirms the above findings. CT ABDOMEN and PELVIS FINDINGS Hepatobiliary: Multiple heterogeneous hypodense lesions present within the liver, largest of which is hypodense with irregular nodular enhancement measuring 3.5 x 4.0 x 1.9 cm (series 13, image 14). Findings are indeterminate, but concerning for possible metastatic disease. Irregular subcapsular lesions with irregularity of the overlying right hepatic contour noted. Periportal edema. Gallbladder within normal limits. No appreciable biliary dilatation. Pancreas: Pancreas within normal limits. Spleen: Spleen displaced anteriorly but otherwise unremarkable. Adrenals/Urinary Tract: Adrenal glands grossly within normal  limits. Kidneys equal in size with symmetric enhancement. Few scattered hypodense lesions within the left kidney too small the characterize. No nephrolithiasis, hydronephrosis, or focal enhancing renal mass. No appreciable hydroureter. Partially distended bladder demonstrates no acute abnormality. Stomach/Bowel: Stomach within normal limits. Small paraumbilical hernia containing short segment loop of small bowel and free fluid noted without associated obstruction. No definite acute inflammatory changes seen about the bowels. Vascular/Lymphatic: Normal intravascular enhancement seen throughout the intra-abdominal aorta. Mesenteric vessels patent proximally. Multiple mesenteric lymph nodes and/or peritoneal implants present within the right mid abdomen, measuring up to 12 mm (series 13, image 38). Shoddy subcentimeter retroperitoneal lymph nodes noted. Reproductive: Large partially solid/partially cystic mass position within the central pelvis and lower abdomen measures 17.6 x 20.0 x 20.0 cm (series 13, image 63). Lesion demonstrates internal cystic components with superimposed large eccentric solid slight soft tissue components. Scattered internal calcifications. 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 Additional right adnexal cystic lesions measure up to 3.7 cm (series 15, image 76), indeterminate. Uterus seen inferiorly and is grossly within normal limits. The native ovaries not well delineated. Other: Large volume free fluid throughout the abdomen and pelvis, measuring simple fluid density. No free intraperitoneal air. Musculoskeletal: Diffuse anasarca. Patient overall somewhat cachectic in appearance. Scoliosis with advanced facet arthrosis noted within the lower lumbar spine. No acute osseous abnormality. No discrete lytic or blastic osseous lesions. Review of the MIP images confirms the above findings. IMPRESSION: CTA CHEST IMPRESSION 1. Mildly limited study due to large left pleural effusion  and motion artifact. No CTA evidence for large or central pulmonary embolus. Evaluation for possible distal small  emboli 2. Large left pleural effusion with complete collapse of the left lung with left-to-right mediastinal shift. 3. Smaller layering right pleural effusion. CT ABDOMEN AND PELVIS IMPRESSION 1. 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 2. Associated large volume ascites throughout the abdomen and pelvis. 3. Multiple heterogeneous hepatic lesions, concerning for possible metastatic disease. 4. Few scattered mildly enlarged mesenteric lymph nodes, which could be reactive or reflect nodal metastases. 5. Additional smaller right adnexal cystic lesions measuring up to 3.7 cm, indeterminate. 6. Anasarca. Electronically Signed   By: Jeannine Boga M.D.   On: 11/26/2017 21:53   Dg Chest Portable 1 View  Result Date: 11/27/2017 CLINICAL DATA:  Pleural effusion. EXAM: PORTABLE CHEST 1 VIEW COMPARISON:  11/26/2017. FINDINGS: Similar appearance of complete whiteout of the left hemithorax. Right lung remains clear. Skin fold noted over the lateral right hemithorax. Thoracolumbar scoliosis again noted. IMPRESSION: Stable exam with large left pleural effusion as seen on yesterday's CT scan. Close follow-up recommended as unilateral effusion can be a sign of malignancy. Electronically Signed   By: Misty Stanley M.D.   On: 11/27/2017 12:03     Assessment/Plan Principal Problem:   Pleural effusion on left Active Problems:   Ovarian tumor   Mesenteric lymphadenopathy   Liver mass   Sinus tachycardia   Pleural effusion   Anemia   Thrombocytosis (HCC)  Large left-sided pleural effusion: Most likely malignant.  Will order IR guided thoracentesis.  We will follow-up the fluid studies, mainly cytology.  She is not in respiratory distress and saturating fine on room air.  Ovarian tumor/mesenteric  lymphadenopathy/heterogenous liver lesions/ascites: These are most likely primary malignancy/metastatic lesions.  No history of malignancy in the past.  Could be primary ovarian tumor/uterine tumor.  Planned for IR guided biopsy of the pelvic mass but gynecology-oncology did not recommend. Discussed with Dr.Phelps.  Gynecology-oncology will follow up the patient here tomorrow.  Follow-up Ca125, CEA.  Also undergoing transvaginal and transabdominal ultrasound Patient needs further work-up. Palliative care also requested for consult to discuss about goals of care as it looks like patient has wide spread metastatic cancer .  Anemia/thrombocytosis: Anemia most likely associated with malignancy.  We will follow-up iron studies.  Thrombocytosis could be associated with malignancy or reactive.  She does not need any transfusion at the moment.  We will continue to monitor.  Sinus tachycardia: Most likely secondary to large left-sided pleural effusion.  Anticipate improvement after thoracocentesis.  Elevated troponin: Patient denies any chest pain.  EKG did not show any ischemic changes.  Most likely this is associated  with supply demand ischemia secondary to large left pleural effusion, sinus tachycardia.  We will continue to monitor.  Severity of Illness: The appropriate patient status for this patient is INPATIENT.  DVT prophylaxis: SCD Code Status: Full Family Communication: None present at the bedside Consults called: Gynecology-oncology by ED      Shelly Coss MD [facility] Pager [phone]  If 7PM-7AM, please contact night-coverage [email or web address] Password [email or web address]  11/27/2017, 1:39 PM

## 2017-11-27 NOTE — ED Notes (Signed)
EDP had a long discussion with the patient about her plan of care and the significance of our findings. Pt adamantly refused admission into the hospital today. This RN confirmed the pt's wishes and that she understands her choice. Pt given resources to follow up at [facility] if she changes her mind for treatment.

## 2017-11-27 NOTE — ED Notes (Signed)
ED TO INPATIENT HANDOFF REPORT  Name/Age/Gender Tracy Flynn 62 y.o. female  Code Status    Code Status Orders  (From admission, onward)        Start     Ordered   11/27/17 1338  Full code  Continuous     11/27/17 1338    Code Status History    This patient has a current code status but no historical code status.      Home/SNF/Other Home  Chief Complaint sent by dr / lung issues / abd issues   Level of Care/Admitting Diagnosis ED Disposition    ED Disposition Condition [facility] Area: [place] [100102]  Level of Care: Telemetry [5]  Admit to tele based on following criteria: Other see comments  Comments: Tachycardia  Diagnosis: Pleural effusion [242230]  Admitting Physician: Shelly Coss [[id number]]  Attending Physician: Shelly Coss [[id number]]  Estimated length of stay: 3 - 4 days  Certification:: I certify this patient will need inpatient services for at least 2 midnights  PT Class (Do Not Modify): Inpatient [101]  PT Acc Code (Do Not Modify): Private [1]       Medical History Past Medical History:  Diagnosis Date  . Uterine fibroid     Allergies Allergies  Allergen Reactions  . Codeine   . Penicillins     Has patient had a PCN reaction causing immediate rash, facial/tongue/throat swelling, SOB or lightheadedness with hypotension: {Yes Has patient had a PCN reaction causing severe rash involving mucus membranes or skin necrosis:No Has patient had a PCN reaction that required hospitalization: No Has patient had a PCN reaction occurring within the last 10 years: No If all of the above answers are "NO", then may proceed with Cephalosporin use.     IV Location/Drains/Wounds Patient Lines/Drains/Airways Status   Active Line/Drains/Airways    Name:   Placement date:   Placement time:   Site:   Days:   Peripheral IV 11/27/17 Left Antecubital   11/27/17    1247    Antecubital   less than 1           Labs/Imaging Results for orders placed or performed during the hospital encounter of 11/27/17 (from the past 48 hour(s))  CBC     Status: Abnormal   Collection Time: 11/27/17 12:44 PM  Result Value Ref Range   WBC 7.7 4.0 - 10.5 K/uL   RBC 3.80 (L) 3.87 - 5.11 MIL/uL   Hemoglobin 8.2 (L) 12.0 - 15.0 g/dL   HCT 27.3 (L) 36.0 - 46.0 %   MCV 71.8 (L) 78.0 - 100.0 fL   MCH 21.6 (L) 26.0 - 34.0 pg   MCHC 30.0 30.0 - 36.0 g/dL   RDW 17.2 (H) 11.5 - 15.5 %   Platelets 757 (H) 150 - 400 K/uL    Comment: Performed at [facility], [place] [address], [place], [place] [address]  Comprehensive metabolic panel     Status: Abnormal   Collection Time: 11/27/17 12:44 PM  Result Value Ref Range   Sodium 138 135 - 145 mmol/L   Potassium 4.4 3.5 - 5.1 mmol/L   Chloride 104 101 - 111 mmol/L   CO2 25 22 - 32 mmol/L   Glucose, Bld 102 (H) 65 - 99 mg/dL   BUN 11 6 - 20 mg/dL   Creatinine, Ser 0.72 0.44 - 1.00 mg/dL   Calcium 8.7 (L) 8.9 - 10.3 mg/dL   Total Protein 8.3 (H) 6.5 -  8.1 g/dL   Albumin 2.5 (L) 3.5 - 5.0 g/dL   AST 13 (L) 15 - 41 U/L   ALT 9 (L) 14 - 54 U/L   Alkaline Phosphatase 44 38 - 126 U/L   Total Bilirubin 0.5 0.3 - 1.2 mg/dL   GFR calc non Af Amer >60 >60 mL/min   GFR calc Af Amer >60 >60 mL/min    Comment: (NOTE) The eGFR has been calculated using the CKD EPI equation. This calculation has not been validated in all clinical situations. eGFR's persistently <60 mL/min signify possible Chronic Kidney Disease.    Anion gap 9 5 - 15    Comment: Performed at [facility], [place] [address], [place], [place] [address]  Troponin I (q 6hr x 3)     Status: Abnormal   Collection Time: 11/27/17  1:59 PM  Result Value Ref Range   Troponin I 0.08 (HH) <0.03 ng/mL    Comment: CRITICAL RESULT CALLED TO, READ BACK BY AND VERIFIED WITH: J.SHIRLEY AT 6045 ON 11/27/17 BY N.THOMPSON Performed at [facility], [place] [address],  [place], [place] [address]   Lactic acid, plasma     Status: None   Collection Time: 11/27/17  1:59 PM  Result Value Ref Range   Lactic Acid, Venous 1.2 0.5 - 1.9 mmol/L    Comment: Performed at [facility], [place] [address], [place], [place] [address]  Body fluid cell count with differential     Status: Abnormal   Collection Time: 11/27/17  3:14 PM  Result Value Ref Range   Fluid Type-FCT Pleural, L    Color, Fluid RED (A) YELLOW   Appearance, Fluid TURBID (A) CLEAR   WBC, Fluid 1,293 (H) 0 - 1,000 cu mm   Neutrophil Count, Fluid 7 0 - 25 %   Lymphs, Fluid 59 %   Monocyte-Macrophage-Serous Fluid 34 (L) 50 - 90 %   Eos, Fluid 0 %   Other Cells, Fluid OTHER CELLS IDENTIFIED AS MESOTHELIAL CELLS %    Comment: CORRELATE WITH CYTOLOGY. Performed at [facility], [place] [address], [place], [place] [address]    Dg Chest 1 View  Result Date: 11/27/2017 CLINICAL DATA:  Status post LEFT thoracentesis. EXAM: CHEST  1 VIEW COMPARISON:  Chest x-ray dated 11/27/2017. FINDINGS: There is persistent complete opacification LEFT hemithorax. No pneumothorax seen. RIGHT lung is clear. IMPRESSION: Persistent complete opacification of the LEFT hemithorax status post thoracentesis. Electronically Signed   By: Franki Cabot M.D.   On: 11/27/2017 15:57   Dg Chest 2 View  Result Date: 11/26/2017 CLINICAL DATA:  Shortness of breath, dry cough and left chest pain for 1 week. EXAM: CHEST - 2 VIEW COMPARISON:  None. FINDINGS: Complete whiteout of the left chest with mediastinal shift to the right. Right lung is clear. Cardiomediastinal predominantly obscured. No pneumothorax. Scoliosis. Soft tissue planes and included osseous structures are non suspicious. IMPRESSION: Complete white out of left chest with mediastinal shift to the right consistent with effusion and potential underlying mass. Findings would be better assessed on contrast enhanced CT. Acute findings discussed with and  reconfirmed by Dr.RACHEL LITTLE on 11/26/2017 at 7:30 pm. Electronically Signed   By: Elon Alas M.D.   On: 11/26/2017 19:30   Ct Angio Chest Pe W/cm &/or Wo Cm  Result Date: 11/26/2017 CLINICAL DATA:  Initial evaluation for possible pulmonary embolism, abdominal swelling. EXAM: CT ANGIOGRAPHY CHEST CT ABDOMEN AND PELVIS WITH CONTRAST TECHNIQUE: Multidetector CT imaging  of the chest was performed using the standard protocol during bolus administration of intravenous contrast. Multiplanar CT image reconstructions and MIPs were obtained to evaluate the vascular anatomy. Multidetector CT imaging of the abdomen and pelvis was performed using the standard protocol during bolus administration of intravenous contrast. CONTRAST:  112m ISOVUE-370 IOPAMIDOL (ISOVUE-370) INJECTION 76% COMPARISON:  Prior chest radiograph from earlier the same day. FINDINGS: CTA CHEST FINDINGS Cardiovascular: Intrathoracic aorta of normal caliber without aneurysm or other acute abnormality. Visualized great vessels within normal limits. Heart and mediastinal structures shifted to the right. Heart size within normal limits. No definite pericardial effusion. Evaluation of the pulmonary arterial tree somewhat limited due to large left pleural effusion and motion artifact. Evaluation for possible distal small emboli on this exam limited. Main pulmonary artery within normal limits for caliber measuring 2.6 cm in diameter. No central or large segmental filling defects seen within the right lung to suggest embolus. There is centrally complete collapse of the left lung, which is contracted towards the left hilum. The left pulmonary arteries are largely decompressed. No large central filling defects seen within the left main and proximal pulmonary arteries. Re-formatted imaging confirms these findings. Mediastinum/Nodes: Mediastinal structures shifted to the right due to the large left pleural effusion. Thyroid within normal limits. No definite  enlarged mediastinal or hilar adenopathy, although evaluation somewhat limited. No enlarged axillary nodes. Esophagus grossly within normal limits. Lungs/Pleura: Tracheobronchial tree deviated to the right but remains patent as do the bronchi supplying the right lung there is compression of the distal left mainstem and segmental bronchi at the left hilum with complete compression of the left lung. Large pleural effusion fills the left hemithorax. Effusion measures simple fluid density without internal complexity or loculation. Smaller layering right pleural effusion present as well. The pneumatized right lung is otherwise clear without infiltrate or edema. No pneumothorax. No visible pulmonary nodule or mass. Musculoskeletal: Anasarca noted. Dextroscoliosis with exaggeration of the normal thoracic kyphosis. Degenerative changes noted within the lower cervical spine. No acute osseous abnormality. No worrisome lytic or blastic osseous lesions. Review of the MIP images confirms the above findings. CT ABDOMEN and PELVIS FINDINGS Hepatobiliary: Multiple heterogeneous hypodense lesions present within the liver, largest of which is hypodense with irregular nodular enhancement measuring 3.5 x 4.0 x 1.9 cm (series 13, image 14). Findings are indeterminate, but concerning for possible metastatic disease. Irregular subcapsular lesions with irregularity of the overlying right hepatic contour noted. Periportal edema. Gallbladder within normal limits. No appreciable biliary dilatation. Pancreas: Pancreas within normal limits. Spleen: Spleen displaced anteriorly but otherwise unremarkable. Adrenals/Urinary Tract: Adrenal glands grossly within normal limits. Kidneys equal in size with symmetric enhancement. Few scattered hypodense lesions within the left kidney too small the characterize. No nephrolithiasis, hydronephrosis, or focal enhancing renal mass. No appreciable hydroureter. Partially distended bladder demonstrates no acute  abnormality. Stomach/Bowel: Stomach within normal limits. Small paraumbilical hernia containing short segment loop of small bowel and free fluid noted without associated obstruction. No definite acute inflammatory changes seen about the bowels. Vascular/Lymphatic: Normal intravascular enhancement seen throughout the intra-abdominal aorta. Mesenteric vessels patent proximally. Multiple mesenteric lymph nodes and/or peritoneal implants present within the right mid abdomen, measuring up to 12 mm (series 13, image 38). Shoddy subcentimeter retroperitoneal lymph nodes noted. Reproductive: Large partially solid/partially cystic mass position within the central pelvis and lower abdomen measures 17.6 x 20.0 x 20.0 cm (series 13, image 63). Lesion demonstrates internal cystic components with superimposed large eccentric solid slight soft tissue components. Scattered internal  calcifications. 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 Additional right adnexal cystic lesions measure up to 3.7 cm (series 15, image 76), indeterminate. Uterus seen inferiorly and is grossly within normal limits. The native ovaries not well delineated. Other: Large volume free fluid throughout the abdomen and pelvis, measuring simple fluid density. No free intraperitoneal air. Musculoskeletal: Diffuse anasarca. Patient overall somewhat cachectic in appearance. Scoliosis with advanced facet arthrosis noted within the lower lumbar spine. No acute osseous abnormality. No discrete lytic or blastic osseous lesions. Review of the MIP images confirms the above findings. IMPRESSION: CTA CHEST IMPRESSION 1. Mildly limited study due to large left pleural effusion and motion artifact. No CTA evidence for large or central pulmonary embolus. Evaluation for possible distal small emboli 2. Large left pleural effusion with complete collapse of the left lung with left-to-right mediastinal shift. 3. Smaller layering right pleural effusion. CT ABDOMEN  AND PELVIS IMPRESSION 1. 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 2. Associated large volume ascites throughout the abdomen and pelvis. 3. Multiple heterogeneous hepatic lesions, concerning for possible metastatic disease. 4. Few scattered mildly enlarged mesenteric lymph nodes, which could be reactive or reflect nodal metastases. 5. Additional smaller right adnexal cystic lesions measuring up to 3.7 cm, indeterminate. 6. Anasarca. Electronically Signed   By: Jeannine Boga M.D.   On: 11/26/2017 21:53   Ct Abdomen Pelvis W Contrast  Result Date: 11/26/2017 CLINICAL DATA:  Initial evaluation for possible pulmonary embolism, abdominal swelling. EXAM: CT ANGIOGRAPHY CHEST CT ABDOMEN AND PELVIS WITH CONTRAST TECHNIQUE: Multidetector CT imaging of the chest was performed using the standard protocol during bolus administration of intravenous contrast. Multiplanar CT image reconstructions and MIPs were obtained to evaluate the vascular anatomy. Multidetector CT imaging of the abdomen and pelvis was performed using the standard protocol during bolus administration of intravenous contrast. CONTRAST:  162m ISOVUE-370 IOPAMIDOL (ISOVUE-370) INJECTION 76% COMPARISON:  Prior chest radiograph from earlier the same day. FINDINGS: CTA CHEST FINDINGS Cardiovascular: Intrathoracic aorta of normal caliber without aneurysm or other acute abnormality. Visualized great vessels within normal limits. Heart and mediastinal structures shifted to the right. Heart size within normal limits. No definite pericardial effusion. Evaluation of the pulmonary arterial tree somewhat limited due to large left pleural effusion and motion artifact. Evaluation for possible distal small emboli on this exam limited. Main pulmonary artery within normal limits for caliber measuring 2.6 cm in diameter. No central or large segmental filling defects  seen within the right lung to suggest embolus. There is centrally complete collapse of the left lung, which is contracted towards the left hilum. The left pulmonary arteries are largely decompressed. No large central filling defects seen within the left main and proximal pulmonary arteries. Re-formatted imaging confirms these findings. Mediastinum/Nodes: Mediastinal structures shifted to the right due to the large left pleural effusion. Thyroid within normal limits. No definite enlarged mediastinal or hilar adenopathy, although evaluation somewhat limited. No enlarged axillary nodes. Esophagus grossly within normal limits. Lungs/Pleura: Tracheobronchial tree deviated to the right but remains patent as do the bronchi supplying the right lung there is compression of the distal left mainstem and segmental bronchi at the left hilum with complete compression of the left lung. Large pleural effusion fills the left hemithorax. Effusion measures simple fluid density without internal complexity or loculation. Smaller layering right pleural effusion present as well. The pneumatized right lung is otherwise clear without infiltrate or edema. No pneumothorax.  No visible pulmonary nodule or mass. Musculoskeletal: Anasarca noted. Dextroscoliosis with exaggeration of the normal thoracic kyphosis. Degenerative changes noted within the lower cervical spine. No acute osseous abnormality. No worrisome lytic or blastic osseous lesions. Review of the MIP images confirms the above findings. CT ABDOMEN and PELVIS FINDINGS Hepatobiliary: Multiple heterogeneous hypodense lesions present within the liver, largest of which is hypodense with irregular nodular enhancement measuring 3.5 x 4.0 x 1.9 cm (series 13, image 14). Findings are indeterminate, but concerning for possible metastatic disease. Irregular subcapsular lesions with irregularity of the overlying right hepatic contour noted. Periportal edema. Gallbladder within normal limits. No  appreciable biliary dilatation. Pancreas: Pancreas within normal limits. Spleen: Spleen displaced anteriorly but otherwise unremarkable. Adrenals/Urinary Tract: Adrenal glands grossly within normal limits. Kidneys equal in size with symmetric enhancement. Few scattered hypodense lesions within the left kidney too small the characterize. No nephrolithiasis, hydronephrosis, or focal enhancing renal mass. No appreciable hydroureter. Partially distended bladder demonstrates no acute abnormality. Stomach/Bowel: Stomach within normal limits. Small paraumbilical hernia containing short segment loop of small bowel and free fluid noted without associated obstruction. No definite acute inflammatory changes seen about the bowels. Vascular/Lymphatic: Normal intravascular enhancement seen throughout the intra-abdominal aorta. Mesenteric vessels patent proximally. Multiple mesenteric lymph nodes and/or peritoneal implants present within the right mid abdomen, measuring up to 12 mm (series 13, image 38). Shoddy subcentimeter retroperitoneal lymph nodes noted. Reproductive: Large partially solid/partially cystic mass position within the central pelvis and lower abdomen measures 17.6 x 20.0 x 20.0 cm (series 13, image 63). Lesion demonstrates internal cystic components with superimposed large eccentric solid slight soft tissue components. Scattered internal calcifications. 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 Additional right adnexal cystic lesions measure up to 3.7 cm (series 15, image 76), indeterminate. Uterus seen inferiorly and is grossly within normal limits. The native ovaries not well delineated. Other: Large volume free fluid throughout the abdomen and pelvis, measuring simple fluid density. No free intraperitoneal air. Musculoskeletal: Diffuse anasarca. Patient overall somewhat cachectic in appearance. Scoliosis with advanced facet arthrosis noted within the lower lumbar spine. No acute osseous  abnormality. No discrete lytic or blastic osseous lesions. Review of the MIP images confirms the above findings. IMPRESSION: CTA CHEST IMPRESSION 1. Mildly limited study due to large left pleural effusion and motion artifact. No CTA evidence for large or central pulmonary embolus. Evaluation for possible distal small emboli 2. Large left pleural effusion with complete collapse of the left lung with left-to-right mediastinal shift. 3. Smaller layering right pleural effusion. CT ABDOMEN AND PELVIS IMPRESSION 1. 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 2. Associated large volume ascites throughout the abdomen and pelvis. 3. Multiple heterogeneous hepatic lesions, concerning for possible metastatic disease. 4. Few scattered mildly enlarged mesenteric lymph nodes, which could be reactive or reflect nodal metastases. 5. Additional smaller right adnexal cystic lesions measuring up to 3.7 cm, indeterminate. 6. Anasarca. Electronically Signed   By: Jeannine Boga M.D.   On: 11/26/2017 21:53   Dg Chest Portable 1 View  Result Date: 11/27/2017 CLINICAL DATA:  Pleural effusion. EXAM: PORTABLE CHEST 1 VIEW COMPARISON:  11/26/2017. FINDINGS: Similar appearance of complete whiteout of the left hemithorax. Right lung remains clear. Skin fold noted over the lateral right hemithorax. Thoracolumbar scoliosis again noted. IMPRESSION: Stable exam with large left pleural effusion as seen on yesterday's CT scan. Close follow-up recommended as unilateral effusion can be a sign  of malignancy. Electronically Signed   By: Misty Stanley M.D.   On: 11/27/2017 12:03   US Thoracentesis Asp Pleural Space W/img Guide  Result Date: 11/27/2017 INDICATION: Patient with history of pelvic cystic mass concerning for ovarian neoplasm, ascites, liver lesions, large left pleural effusion. Request made for diagnostic and therapeutic left  thoracentesis. EXAM: ULTRASOUND GUIDED DIAGNOSTIC AND THERAPEUTIC LEFT THORACENTESIS MEDICATIONS: None COMPLICATIONS: None immediate. PROCEDURE: An ultrasound guided thoracentesis was thoroughly discussed with the patient and questions answered. The benefits, risks, alternatives and complications were also discussed. The patient understands and wishes to proceed with the procedure. Written consent was obtained. Ultrasound was performed to localize and mark an adequate pocket of fluid in the left chest. The area was then prepped and draped in the normal sterile fashion. 1% Lidocaine was used for local anesthesia. Under ultrasound guidance a 6 Fr Safe-T-Centesis catheter was introduced. Thoracentesis was performed. The catheter was removed and a dressing applied. FINDINGS: A total of approximately 2.2 liters of bloody fluid was removed. Samples were sent to the laboratory as requested by the clinical team. Due to this being patient's initial thoracentesis only the above amount of fluid was removed today. IMPRESSION: Successful ultrasound guided diagnostic and therapeutic left thoracentesis yielding 2.2 liters of pleural fluid. Read by: Rowe Robert, PA-C Electronically Signed   By: Sandi Mariscal M.D.   On: 11/27/2017 15:44    Pending Labs Unresulted Labs (From admission, onward)   Start     Ordered   11/28/17 5883  Basic metabolic panel  Tomorrow morning,   R     11/27/17 1338   11/27/17 1516  Lactate dehydrogenase (pleural or peritoneal fluid)  R     11/27/17 1516   11/27/17 1516  Body fluid culture  R     11/27/17 1516   11/27/17 1516  Protein, pleural or peritoneal fluid  R     11/27/17 1516   11/27/17 1338  HIV antibody (Routine Testing)  Once,   R     11/27/17 1338   11/27/17 1330  Troponin I (q 6hr x 3)  Now then every 6 hours,   R     11/27/17 1329   11/27/17 1330  Ferritin  Once,   R     11/27/17 1329   11/27/17 1330  Iron and TIBC  Once,   R     11/27/17 1329   11/27/17 1300  CA 125  Once,    R     11/27/17 1300   11/27/17 1300  CEA  Once,   STAT     11/27/17 1300      Vitals/Pain Today's Vitals   11/27/17 1457 11/27/17 1515 11/27/17 1600 11/27/17 1700  BP: 133/81 (!) 142/83 137/81 (!) 141/77  Pulse:   (!) 108 (!) 109  Resp:   18 (!) 30  Temp:      TempSrc:      SpO2:   97% 97%  Weight:      Height:      PainSc:        Isolation Precautions No active isolations  Medications Medications  morphine 2 MG/ML injection 2 mg (has no administration in time range)  ondansetron (ZOFRAN) injection 4 mg (has no administration in time range)  lidocaine (XYLOCAINE) 1 % (with pres) injection (has no administration in time range)    Mobility walks

## 2017-11-27 NOTE — ED Notes (Signed)
Adhikari MD paged regarding patient's elevated troponin.

## 2017-11-27 NOTE — ED Notes (Signed)
Patient transported to Ultrasound 

## 2017-11-27 NOTE — ED Provider Notes (Signed)
[facility] DEPT Provider Note   CSN: [id number] Arrival date & time: 11/27/17  1006  History   Chief Complaint Chief Complaint  Patient presents with  . Shortness of Breath    HPI Tracy Flynn is a 62 y.o. female presenting to the ED with shortness of breath.  Patient was seen yesterday evening at [facility] ED for shortness of breath, also with ascites.  Work-up in the ED noted a large left pleural effusion with lung collapse that caused whiteout of left hemithorax on chest x-ray.  CT imaging also noted a large cystic mass within the pelvis concerning for ovarian neoplasm, also with ascites, multiple hepatic lesions, mesenteric lymphadenopathy, anasarca.  Overall presentation concerning for metastatic disease.  The patient was recommended to be admitted in transfer to [facility] for further management, however the patient declined at that time and left the emergency department.  She reports she felt improved last night after receiving pain medicine and slept well.  Upon waking this morning, she felt the return of her dyspnea and decided to come to [place] long as previously instructed.  States her left-sided back pain is also started to return.  She has not noticed any other significant change in interval.   Upon discussing previous imaging findings, she initially states she has known about her pelvic tumor for a long time.  However, based on her description of a "fibroid tumor" causing her issues in the past, it seems that she may have previously been diagnosed with fibroids rather than an ovarian mass.  Past Medical History:  Diagnosis Date  . Uterine fibroid     There are no active problems to display for this patient.   Past Surgical History:  Procedure Laterality Date  . HERNIA REPAIR       OB History   None      Home Medications    Prior to Admission medications   Medication Sig Start Date End Date Taking? Authorizing Provider    ibuprofen (ADVIL,MOTRIN) 200 MG tablet Take 200 mg by mouth daily as needed (PAIN).   Yes [provider]  OVER THE COUNTER MEDICATION Take 2 tablets by mouth at bedtime.   Yes [provider]    Family History Family History  Problem Relation Age of Onset  . Cancer Father     Social History Social History   Tobacco Use  . Smoking status: Former Research scientist (life sciences)  . Smokeless tobacco: Never Used  Substance Use Topics  . Alcohol use: Never    Frequency: Never  . Drug use: Never     Allergies   Codeine and Penicillins   Review of Systems Review of Systems  Constitutional: Negative for chills and fever.  Respiratory: Positive for shortness of breath.   Cardiovascular: Positive for leg swelling. Negative for chest pain.  Gastrointestinal: Positive for abdominal distention. Negative for nausea and vomiting.  Musculoskeletal: Positive for back pain.  Skin: Negative for rash.  Neurological: Negative for light-headedness.     Physical Exam Updated Vital Signs BP 132/90 (BP Location: Right Arm)   Pulse (!) 118   Temp 98.6 F (37 C) (Oral)   Resp 16   Ht 5\' 11"  (1.803 m)   Wt 69.4 kg (153 lb)   SpO2 99%   BMI 21.34 kg/m   General: Sitting in chair in ED room, no acute distress Head: Normocephalic, atraumatic Eyes: PERRL, EOMI, normal conjuctiva  ENT: No cervical lymphadenopathy, very slight rightward tracheal deviation, moist mucus  membranes   CV: Tachycardic and regular, no murmur appreciated  Resp: Diminished breath sounds on L compared to R, normal work of breathing   Abd: Distended with fluid wave and fullness to lower quadrants,  Extr: Bilateral pitting edema to  Neuro: Alert and oriented x3, normal gait, moving all extremities normally Skin: Warm, dry     ED Treatments / Results  Labs (all labs ordered are listed, but only abnormal results are displayed) Labs Reviewed  CBC - Abnormal; Notable for the following components:      Result Value    RBC 3.80 (*)    Hemoglobin 8.2 (*)    HCT 27.3 (*)    MCV 71.8 (*)    MCH 21.6 (*)    RDW 17.2 (*)    Platelets 757 (*)    All other components within normal limits  COMPREHENSIVE METABOLIC PANEL  CA 528  CEA    EKG None  Radiology Dg Chest 2 View  Result Date: 11/26/2017 CLINICAL DATA:  Shortness of breath, dry cough and left chest pain for 1 week. EXAM: CHEST - 2 VIEW COMPARISON:  None. FINDINGS: Complete whiteout of the left chest with mediastinal shift to the right. Right lung is clear. Cardiomediastinal predominantly obscured. No pneumothorax. Scoliosis. Soft tissue planes and included osseous structures are non suspicious. IMPRESSION: Complete white out of left chest with mediastinal shift to the right consistent with effusion and potential underlying mass. Findings would be better assessed on contrast enhanced CT. Acute findings discussed with and reconfirmed by Dr.RACHEL LITTLE on 11/26/2017 at 7:30 pm. Electronically Signed   By: Elon Alas M.D.   On: 11/26/2017 19:30   Ct Angio Chest Pe W/cm &/or Wo Cm  Result Date: 11/26/2017 CLINICAL DATA:  Initial evaluation for possible pulmonary embolism, abdominal swelling. EXAM: CT ANGIOGRAPHY CHEST CT ABDOMEN AND PELVIS WITH CONTRAST TECHNIQUE: Multidetector CT imaging of the chest was performed using the standard protocol during bolus administration of intravenous contrast. Multiplanar CT image reconstructions and MIPs were obtained to evaluate the vascular anatomy. Multidetector CT imaging of the abdomen and pelvis was performed using the standard protocol during bolus administration of intravenous contrast. CONTRAST:  1104mL ISOVUE-370 IOPAMIDOL (ISOVUE-370) INJECTION 76% COMPARISON:  Prior chest radiograph from earlier the same day. FINDINGS: CTA CHEST FINDINGS Cardiovascular: Intrathoracic aorta of normal caliber without aneurysm or other acute abnormality. Visualized great vessels within normal limits. Heart and mediastinal structures  shifted to the right. Heart size within normal limits. No definite pericardial effusion. Evaluation of the pulmonary arterial tree somewhat limited due to large left pleural effusion and motion artifact. Evaluation for possible distal small emboli on this exam limited. Main pulmonary artery within normal limits for caliber measuring 2.6 cm in diameter. No central or large segmental filling defects seen within the right lung to suggest embolus. There is centrally complete collapse of the left lung, which is contracted towards the left hilum. The left pulmonary arteries are largely decompressed. No large central filling defects seen within the left main and proximal pulmonary arteries. Re-formatted imaging confirms these findings. Mediastinum/Nodes: Mediastinal structures shifted to the right due to the large left pleural effusion. Thyroid within normal limits. No definite enlarged mediastinal or hilar adenopathy, although evaluation somewhat limited. No enlarged axillary nodes. Esophagus grossly within normal limits. Lungs/Pleura: Tracheobronchial tree deviated to the right but remains patent as do the bronchi supplying the right lung there is compression of the distal left mainstem and segmental bronchi at the left hilum with complete compression  of the left lung. Large pleural effusion fills the left hemithorax. Effusion measures simple fluid density without internal complexity or loculation. Smaller layering right pleural effusion present as well. The pneumatized right lung is otherwise clear without infiltrate or edema. No pneumothorax. No visible pulmonary nodule or mass. Musculoskeletal: Anasarca noted. Dextroscoliosis with exaggeration of the normal thoracic kyphosis. Degenerative changes noted within the lower cervical spine. No acute osseous abnormality. No worrisome lytic or blastic osseous lesions. Review of the MIP images confirms the above findings. CT ABDOMEN and PELVIS FINDINGS Hepatobiliary: Multiple  heterogeneous hypodense lesions present within the liver, largest of which is hypodense with irregular nodular enhancement measuring 3.5 x 4.0 x 1.9 cm (series 13, image 14). Findings are indeterminate, but concerning for possible metastatic disease. Irregular subcapsular lesions with irregularity of the overlying right hepatic contour noted. Periportal edema. Gallbladder within normal limits. No appreciable biliary dilatation. Pancreas: Pancreas within normal limits. Spleen: Spleen displaced anteriorly but otherwise unremarkable. Adrenals/Urinary Tract: Adrenal glands grossly within normal limits. Kidneys equal in size with symmetric enhancement. Few scattered hypodense lesions within the left kidney too small the characterize. No nephrolithiasis, hydronephrosis, or focal enhancing renal mass. No appreciable hydroureter. Partially distended bladder demonstrates no acute abnormality. Stomach/Bowel: Stomach within normal limits. Small paraumbilical hernia containing short segment loop of small bowel and free fluid noted without associated obstruction. No definite acute inflammatory changes seen about the bowels. Vascular/Lymphatic: Normal intravascular enhancement seen throughout the intra-abdominal aorta. Mesenteric vessels patent proximally. Multiple mesenteric lymph nodes and/or peritoneal implants present within the right mid abdomen, measuring up to 12 mm (series 13, image 38). Shoddy subcentimeter retroperitoneal lymph nodes noted. Reproductive: Large partially solid/partially cystic mass position within the central pelvis and lower abdomen measures 17.6 x 20.0 x 20.0 cm (series 13, image 63). Lesion demonstrates internal cystic components with superimposed large eccentric solid slight soft tissue components. Scattered internal calcifications. 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 Additional right adnexal cystic lesions measure up to 3.7 cm (series 15, image 76), indeterminate. Uterus seen  inferiorly and is grossly within normal limits. The native ovaries not well delineated. Other: Large volume free fluid throughout the abdomen and pelvis, measuring simple fluid density. No free intraperitoneal air. Musculoskeletal: Diffuse anasarca. Patient overall somewhat cachectic in appearance. Scoliosis with advanced facet arthrosis noted within the lower lumbar spine. No acute osseous abnormality. No discrete lytic or blastic osseous lesions. Review of the MIP images confirms the above findings. IMPRESSION: CTA CHEST IMPRESSION 1. Mildly limited study due to large left pleural effusion and motion artifact. No CTA evidence for large or central pulmonary embolus. Evaluation for possible distal small emboli 2. Large left pleural effusion with complete collapse of the left lung with left-to-right mediastinal shift. 3. Smaller layering right pleural effusion. CT ABDOMEN AND PELVIS IMPRESSION 1. 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 2. Associated large volume ascites throughout the abdomen and pelvis. 3. Multiple heterogeneous hepatic lesions, concerning for possible metastatic disease. 4. Few scattered mildly enlarged mesenteric lymph nodes, which could be reactive or reflect nodal metastases. 5. Additional smaller right adnexal cystic lesions measuring up to 3.7 cm, indeterminate. 6. Anasarca. Electronically Signed   By: Jeannine Boga M.D.   On: 11/26/2017 21:53   Ct Abdomen Pelvis W Contrast  Result Date: 11/26/2017 CLINICAL DATA:  Initial evaluation for possible pulmonary embolism, abdominal swelling. EXAM: CT ANGIOGRAPHY CHEST CT ABDOMEN AND PELVIS WITH CONTRAST  TECHNIQUE: Multidetector CT imaging of the chest was performed using the standard protocol during bolus administration of intravenous contrast. Multiplanar CT image reconstructions and MIPs were obtained to evaluate the vascular anatomy.  Multidetector CT imaging of the abdomen and pelvis was performed using the standard protocol during bolus administration of intravenous contrast. CONTRAST:  174mL ISOVUE-370 IOPAMIDOL (ISOVUE-370) INJECTION 76% COMPARISON:  Prior chest radiograph from earlier the same day. FINDINGS: CTA CHEST FINDINGS Cardiovascular: Intrathoracic aorta of normal caliber without aneurysm or other acute abnormality. Visualized great vessels within normal limits. Heart and mediastinal structures shifted to the right. Heart size within normal limits. No definite pericardial effusion. Evaluation of the pulmonary arterial tree somewhat limited due to large left pleural effusion and motion artifact. Evaluation for possible distal small emboli on this exam limited. Main pulmonary artery within normal limits for caliber measuring 2.6 cm in diameter. No central or large segmental filling defects seen within the right lung to suggest embolus. There is centrally complete collapse of the left lung, which is contracted towards the left hilum. The left pulmonary arteries are largely decompressed. No large central filling defects seen within the left main and proximal pulmonary arteries. Re-formatted imaging confirms these findings. Mediastinum/Nodes: Mediastinal structures shifted to the right due to the large left pleural effusion. Thyroid within normal limits. No definite enlarged mediastinal or hilar adenopathy, although evaluation somewhat limited. No enlarged axillary nodes. Esophagus grossly within normal limits. Lungs/Pleura: Tracheobronchial tree deviated to the right but remains patent as do the bronchi supplying the right lung there is compression of the distal left mainstem and segmental bronchi at the left hilum with complete compression of the left lung. Large pleural effusion fills the left hemithorax. Effusion measures simple fluid density without internal complexity or loculation. Smaller layering right pleural effusion present as  well. The pneumatized right lung is otherwise clear without infiltrate or edema. No pneumothorax. No visible pulmonary nodule or mass. Musculoskeletal: Anasarca noted. Dextroscoliosis with exaggeration of the normal thoracic kyphosis. Degenerative changes noted within the lower cervical spine. No acute osseous abnormality. No worrisome lytic or blastic osseous lesions. Review of the MIP images confirms the above findings. CT ABDOMEN and PELVIS FINDINGS Hepatobiliary: Multiple heterogeneous hypodense lesions present within the liver, largest of which is hypodense with irregular nodular enhancement measuring 3.5 x 4.0 x 1.9 cm (series 13, image 14). Findings are indeterminate, but concerning for possible metastatic disease. Irregular subcapsular lesions with irregularity of the overlying right hepatic contour noted. Periportal edema. Gallbladder within normal limits. No appreciable biliary dilatation. Pancreas: Pancreas within normal limits. Spleen: Spleen displaced anteriorly but otherwise unremarkable. Adrenals/Urinary Tract: Adrenal glands grossly within normal limits. Kidneys equal in size with symmetric enhancement. Few scattered hypodense lesions within the left kidney too small the characterize. No nephrolithiasis, hydronephrosis, or focal enhancing renal mass. No appreciable hydroureter. Partially distended bladder demonstrates no acute abnormality. Stomach/Bowel: Stomach within normal limits. Small paraumbilical hernia containing short segment loop of small bowel and free fluid noted without associated obstruction. No definite acute inflammatory changes seen about the bowels. Vascular/Lymphatic: Normal intravascular enhancement seen throughout the intra-abdominal aorta. Mesenteric vessels patent proximally. Multiple mesenteric lymph nodes and/or peritoneal implants present within the right mid abdomen, measuring up to 12 mm (series 13, image 38). Shoddy subcentimeter retroperitoneal lymph nodes noted.  Reproductive: Large partially solid/partially cystic mass position within the central pelvis and lower abdomen measures 17.6 x 20.0 x 20.0 cm (series 13, image 63). Lesion demonstrates internal cystic components with superimposed large eccentric solid slight  soft tissue components. Scattered internal calcifications. 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 Additional right adnexal cystic lesions measure up to 3.7 cm (series 15, image 76), indeterminate. Uterus seen inferiorly and is grossly within normal limits. The native ovaries not well delineated. Other: Large volume free fluid throughout the abdomen and pelvis, measuring simple fluid density. No free intraperitoneal air. Musculoskeletal: Diffuse anasarca. Patient overall somewhat cachectic in appearance. Scoliosis with advanced facet arthrosis noted within the lower lumbar spine. No acute osseous abnormality. No discrete lytic or blastic osseous lesions. Review of the MIP images confirms the above findings. IMPRESSION: CTA CHEST IMPRESSION 1. Mildly limited study due to large left pleural effusion and motion artifact. No CTA evidence for large or central pulmonary embolus. Evaluation for possible distal small emboli 2. Large left pleural effusion with complete collapse of the left lung with left-to-right mediastinal shift. 3. Smaller layering right pleural effusion. CT ABDOMEN AND PELVIS IMPRESSION 1. 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 2. Associated large volume ascites throughout the abdomen and pelvis. 3. Multiple heterogeneous hepatic lesions, concerning for possible metastatic disease. 4. Few scattered mildly enlarged mesenteric lymph nodes, which could be reactive or reflect nodal metastases. 5. Additional smaller right adnexal cystic lesions measuring up to 3.7 cm, indeterminate. 6. Anasarca. Electronically Signed   By: Jeannine Boga M.D.   On: 11/26/2017 21:53   Dg Chest Portable 1 View  Result Date: 11/27/2017 CLINICAL DATA:  Pleural effusion. EXAM: PORTABLE CHEST 1 VIEW COMPARISON:  11/26/2017. FINDINGS: Similar appearance of complete whiteout of the left hemithorax. Right lung remains clear. Skin fold noted over the lateral right hemithorax. Thoracolumbar scoliosis again noted. IMPRESSION: Stable exam with large left pleural effusion as seen on yesterday's CT scan. Close follow-up recommended as unilateral effusion can be a sign of malignancy. Electronically Signed   By: Misty Stanley M.D.   On: 11/27/2017 12:03    Procedures Procedures (including critical care time)  Medications Ordered in ED Medications - No data to display   Initial Impression / Assessment and Plan / ED Course  I have reviewed the triage vital signs and the nursing notes.  Pertinent labs & imaging results that were available during my care of the patient were reviewed by me and considered in my medical decision making (see chart for details).  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 She is currently stable with elevated blood pressure and maintaining oxygen saturation on room air. She will require admission for thoracentesis and potential further workup for formal diagnosis of suspected ovarian cancer. Will contact hospitalist for admission for further workup.    Discussed case with hospitalist, placed order for thoracentesis to be performed. Also discussed case with gyn onc who recommended addition of tumor markers to orders, will review images for best possible biopsy location. Placed consult order for ease of follow up for gyn/onc and admitting team.   Final Clinical Impressions(s) / ED Diagnoses   Final diagnoses:  Pleural effusion  Ovarian cystic mass    ED Discharge Orders    None       Tawny Asal,  MD 11/27/17 1315    Lacretia Leigh, MD 11/27/17 1513

## 2017-11-27 NOTE — ED Notes (Signed)
XR at bedside

## 2017-11-27 NOTE — Procedures (Signed)
Ultrasound-guided diagnostic and therapeutic left thoracentesis performed yielding 2.2 liters of bloody  fluid. No immediate complications. Follow-up chest x-ray pending. The fluid was sent to the lab for preordered studies. Due to this being pt's initial thoracentesis only the above amount of fluid was removed today.

## 2017-11-28 ENCOUNTER — Inpatient Hospital Stay (HOSPITAL_COMMUNITY): Payer: Self-pay

## 2017-11-28 DIAGNOSIS — N83209 Unspecified ovarian cyst, unspecified side: Secondary | ICD-10-CM

## 2017-11-28 DIAGNOSIS — D473 Essential (hemorrhagic) thrombocythemia: Secondary | ICD-10-CM

## 2017-11-28 DIAGNOSIS — R9389 Abnormal findings on diagnostic imaging of other specified body structures: Secondary | ICD-10-CM

## 2017-11-28 DIAGNOSIS — D4959 Neoplasm of unspecified behavior of other genitourinary organ: Secondary | ICD-10-CM

## 2017-11-28 DIAGNOSIS — D509 Iron deficiency anemia, unspecified: Secondary | ICD-10-CM

## 2017-11-28 DIAGNOSIS — R590 Localized enlarged lymph nodes: Secondary | ICD-10-CM

## 2017-11-28 DIAGNOSIS — R188 Other ascites: Secondary | ICD-10-CM

## 2017-11-28 DIAGNOSIS — R000 Tachycardia, unspecified: Secondary | ICD-10-CM

## 2017-11-28 DIAGNOSIS — R160 Hepatomegaly, not elsewhere classified: Secondary | ICD-10-CM

## 2017-11-28 LAB — BASIC METABOLIC PANEL
Anion gap: 7 (ref 5–15)
BUN: 10 mg/dL (ref 6–20)
CALCIUM: 8.3 mg/dL — AB (ref 8.9–10.3)
CO2: 26 mmol/L (ref 22–32)
Chloride: 105 mmol/L (ref 101–111)
Creatinine, Ser: 0.61 mg/dL (ref 0.44–1.00)
GFR calc Af Amer: >60 mL/min (ref >60)
GLUCOSE: 102 mg/dL — AB (ref 65–99)
Potassium: 3.7 mmol/L (ref 3.5–5.1)
SODIUM: 138 mmol/L (ref 135–145)

## 2017-11-28 LAB — CBC WITH DIFFERENTIAL/PLATELET
BASOS ABS: 0 10*3/uL (ref 0.0–0.1)
Basophils Relative: 0 %
EOS ABS: 0.1 10*3/uL (ref 0.0–0.7)
Eosinophils Relative: 1 %
HCT: 29.3 % — ABNORMAL LOW (ref 36.0–46.0)
Hemoglobin: 8.7 g/dL — ABNORMAL LOW (ref 12.0–15.0)
Lymphocytes Relative: 14 %
Lymphs Abs: 0.9 10*3/uL (ref 0.7–4.0)
MCH: 21.2 pg — ABNORMAL LOW (ref 26.0–34.0)
MCHC: 29.7 g/dL — AB (ref 30.0–36.0)
MCV: 71.3 fL — ABNORMAL LOW (ref 78.0–100.0)
MONO ABS: 0.7 10*3/uL (ref 0.1–1.0)
Monocytes Relative: 11 %
NEUTROS ABS: 4.5 10*3/uL (ref 1.7–7.7)
Neutrophils Relative %: 74 %
PLATELETS: 738 10*3/uL — AB (ref 150–400)
RBC: 4.11 MIL/uL (ref 3.87–5.11)
RDW: 17.3 % — AB (ref 11.5–15.5)
WBC: 6.2 10*3/uL (ref 4.0–10.5)

## 2017-11-28 LAB — CA 125: CANCER ANTIGEN (CA) 125: 142 U/mL — AB (ref 0.0–38.1)

## 2017-11-28 LAB — TROPONIN I: TROPONIN I: 0.07 ng/mL — AB (ref <0.03)

## 2017-11-28 LAB — HIV ANTIBODY (ROUTINE TESTING W REFLEX): HIV SCREEN 4TH GENERATION: NONREACTIVE

## 2017-11-28 LAB — CEA: CEA1: 0.7 ng/mL (ref 0.0–4.7)

## 2017-11-28 MED ORDER — IPRATROPIUM-ALBUTEROL 0.5-2.5 (3) MG/3ML IN SOLN: 3.0000 mL | Freq: Four times a day (QID) | RESPIRATORY_TRACT | 0 refills | Status: DC | PRN

## 2017-11-28 MED ORDER — IPRATROPIUM-ALBUTEROL 0.5-2.5 (3) MG/3ML IN SOLN: 3.0000 mL | Freq: Four times a day (QID) | RESPIRATORY_TRACT | Status: DC | PRN

## 2017-11-28 NOTE — Care Management Note (Signed)
Case Management Note  Patient Details  Name: Tracy Flynn MRN: [id number] Date of Birth: 06-21-1956  Subjective/Objective:  Large left-side pleural effusion, ovarian tumor, mesenteric lymphadenopathy             Action/Plan: NCM spoke to pt and currently without insurance coverage. Contacted [facility] for neb machine for home. Provided pt with goodrx coupon for Rx for neb solution for [name] for $32. States she could afford medication. Provided pt with brochure for [facility] to call and arrange a follow up appt to see PCP. She can also contact [facility] in [place] to arrange follow up at that community clinic to see PCP.   Expected Discharge Date:  11/28/17               Expected Discharge Plan:  Home/Self Care  In-House Referral:  NA  Discharge planning Services  CM Consult  Post Acute Care Choice:  NA Choice offered to:  NA  DME Arranged:  Nebulizer machine DME Agency:  [place] Arranged:  NA [place] Agency:  NA  Status of Service:  Completed, signed off  If discussed at [place] of Stay Meetings, dates discussed:    Additional Comments:  Erenest Rasher, RN 11/28/2017, 4:27 PM

## 2017-11-28 NOTE — Progress Notes (Signed)
Pt ambulated over 400 ft without SOB or complaints of discomfort.  Oxygen sat remained at 100% throughout ambulation.

## 2017-11-28 NOTE — Discharge Summary (Signed)
Physician Discharge Summary  Tracy Flynn EGB:[id number] DOB: 07-08-1955 DOA: 11/27/2017  PCP: Patient, No Pcp Per  Admit date: 11/27/2017 Discharge date: 11/28/2017  Admitted From: Home Disposition: Home  Recommendations for Outpatient Follow-up:  1. Follow up with and Establish with PCP in 1-2 weeks 2. Follow up with Dr. Precious Haws in Gyne-Onc as an outpatient within 1 week for D and C;  3. Repeat CXR in 3-6 weeks 4. Have PCP or Gyne-Onc Refer you to Interventional Radiology for repeat Thoracentesis/Paracentesis if necessary  5. Please obtain CMP/CBC, Mag, Phos in one week 6. Please follow up on the following pending results:  Home Health: No Equipment/Devices: Nebulizer    Discharge Condition: Stable CODE STATUS: FULL CODE  Diet recommendation: Heart Healthy Diet   Brief/Interim Summary: Tracy Flynn is a 62 y.o. female with history of uterine fibroids who comes to the emergency department with complaints of shortness of breath.  Patient was at emergency department at [facility] yesterday and was found to have large left-sided pleural effusion but she left AGAINST MEDICAL ADVICE.  She presented to [facility] again with shortness of breath and was recommended to go emergency department at [facility].  Patient denied any significant past medical history except for uterine fibroid diagnosed several years ago but was not treated.  She has not seen any doctors for last several years.  She lives by herself.  She does not take any medications.  She works in Human resources officer.  She denies smoking or drinking.  Patient has been having progressive shortness of breath since last Monday. She says she cannot walk more than few steps until she gets short of breath and has to rest.  She was also complaining of back pain on her left side and has noticed progressive swelling of her abdomen she says.  She has lost 25 to 30 pounds since last December but states she intentionally lost it by  dieting.  Patient was found to be in sinus tachycardia on presentation.  Imaging showed large left-sided pleural effusion with almost complete opacification on the left side with mediastinal shift.  CT imaging showed large mass within the pelvis concerning for ovarian neoplasm, ascites, multiple hepatic lesions consistent with metastatic disease, mesenteric lymphadenopathy.  All of these findings were concerning for metastatic disease.  Patient was repeatedly wishing to go home after thoracocentesis but agreed to stay and admitted after repeated counseling.Gynecology-oncology was consulted and and decided that they will follow-up and I spoke with Dr. Gerarda Fraction who recommended that she will undergo D and C as an outpatient. A transvaginal and Pelvic U/S was ordered and it appeared she had Endometrial Thickening concerning for malignancy. IR guided thoracocentesis ordered and 2.2 Liters drained. She improved after Thoracentesis and ambulated the halls without desaturation. She was deemed medically stable for D/C and will need to follow up and establish with PCP and follow up with Gyn-Onc Dr. Gerarda Fraction for D and C and completion of malignancy workup and outpatient evaluation for Anasarca and Liver Lesions.  Discharge Diagnoses:  Principal Problem:   Pleural effusion on left Active Problems:   Ovarian tumor   Mesenteric lymphadenopathy   Liver mass   Sinus tachycardia   Pleural effusion   Anemia   Thrombocytosis (HCC)  Large left-sided pleural effusion -Most likely malignant. -Ordered IR guided thoracentesis and 2.2 Liters drained. -We will follow-up the fluid studies, mainly cytology as an outpatient.   -She is not in respiratory distress and saturating fine on room  air after ambulating and having Home O2 Screen done.  Ovarian tumor/mesenteric lymphadenopathy/heterogenous liver lesions/ascites:  -These are most likely primary malignancy/metastatic lesions.   -No history of malignancy in the past.    -Could be primary ovarian tumor/uterine tumor but feels like it is mainly Uterine.   -Planned for IR guided biopsy of the pelvic mass but gynecology-oncology did not recommend.  -Discussed with Dr.Phelps and Gynecology-oncology will follow up the patient as an outpatient for D and C.   -Ca125 as elevated, CEA pending.   -Also undergoing transvaginal and transabdominal ultrasound which showed massively thickened (14.3 cm) and markedly heterogenous endometrium which was highly suspicious primary endometrial Malignancy -There was also markedly enlarged myomatous uterus with severe bulky partially calcified uterine fibroids the largest being 11.0 cm in the uterine body. Also she had a Right Adnexal Cystic Mass which was complex and multilocular 6.1 cm on the Right side consistent  With aggressive features. Primary Right ovarian cystic epithelial malignancy could not be excluded.  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   -Iron Studies showed Iron level of 10, UIBC of 157, TIBC 167, Saturation Ratios 6 %, and Ferritin was 367, .   -Thrombocytosis could be associated with malignancy or reactive.   -She does not need any transfusion at the moment.   -Hb/Hc was 8.7/29.3 -Will defer to Gyn-Onc to start Iron Treatment as an outpatient  Sinus tachycardia -Most likely secondary to large left-sided pleural effusion.   -Improved.  -Follow up EKG as an outpatient  Elevated troponin -Patient denies any chest pain.   -EKG did not show any ischemic changes.  Most likely this is associated  with supply demand ischemia secondary to large left pleural effusion, sinus tachycardia.   -Follow up as an outpatient with PCP  Liver Lesions -Seen on CT and showed Multiple  heterogeneous hypodense lesions present within the liver, largest of which is hypodense with irregular nodular enhancement measuring 3.5 x 4.0 x 1.9 cm (series 13, image 14). Findings are indeterminate, but concerning for possible metastatic disease. Irregular subcapsular lesions with irregularity of the overlying right hepatic contour noted -Can follow up as an outpatient and get MRI -Follow up on Thoracentesis cytology prior to evaluation  Abdominal Ascites and Pelvic Ascites with Anasarac -Seen on CT Scan; Likely Malignant -Getting Thoracentesis and can follow up cytology  -Can get Diagnostic Paracentesis as an outpatient with IR and have Gyn-Onc/ PCP set up  Discharge Instructions  Discharge Instructions    Call MD for:  difficulty breathing, headache or visual disturbances   Complete by:  As directed    Call MD for:  extreme fatigue   Complete by:  As directed    Call MD for:  hives   Complete by:  As directed    Call MD for:  persistant dizziness or light-headedness   Complete by:  As directed    Call MD for:  persistant nausea and vomiting   Complete by:  As directed    Call MD for:  redness, tenderness, or signs of infection (pain, swelling, redness, odor or green/yellow discharge around incision site)   Complete by:  As directed    Call MD for:  severe uncontrolled pain   Complete by:  As directed    Call MD for:  temperature >100.4  Complete by:  As directed    Diet - low sodium heart healthy   Complete by:  As directed    Discharge instructions   Complete by:  As directed    Up and establish with primary care physician as well as follow-up with her gynecologist Dr. Gerarda Fraction within the next week for a dilatation and curettage.  Take all medications as prescribed.  If symptoms change or worsen please return to the emergency room for evaluation.   Increase activity slowly   Complete by:  As directed      Allergies as of 11/28/2017      Reactions   Codeine    Penicillins     Has patient had a PCN reaction causing immediate rash, facial/tongue/throat swelling, SOB or lightheadedness with hypotension: {Yes Has patient had a PCN reaction causing severe rash involving mucus membranes or skin necrosis:No Has patient had a PCN reaction that required hospitalization: No Has patient had a PCN reaction occurring within the last 10 years: No If all of the above answers are "NO", then may proceed with Cephalosporin use.      Medication List    STOP taking these medications   OVER THE COUNTER MEDICATION     TAKE these medications   ibuprofen 200 MG tablet Commonly known as:  ADVIL,MOTRIN Take 200 mg by mouth daily as needed (PAIN).   ipratropium-albuterol 0.5-2.5 (3) MG/3ML Soln Commonly known as:  DUONEB Take 3 mLs by nebulization every 6 (six) hours as needed.            Durable Medical Equipment  (From admission, onward)        Start     Ordered   11/28/17 1419  For home use only DME Nebulizer/meds  Once    Question:  Patient needs a nebulizer to treat with the following condition  Answer:  SOB (shortness of breath)   11/28/17 1418     Follow-up Information    Isabel Caprice, MD. Call.   Specialty:  Gynecologic Oncology Why:  Follow up with Dr. Gerarda Fraction within 1 week for D and C and further evaluation for Endometrial Thickening.  Contact information: [place] [address] [phone]          Allergies  Allergen Reactions  . Codeine   . Penicillins     Has patient had a PCN reaction causing immediate rash, facial/tongue/throat swelling, SOB or lightheadedness with hypotension: {Yes Has patient had a PCN reaction causing severe rash involving mucus membranes or skin necrosis:No Has patient had a PCN reaction that required hospitalization: No Has patient had a PCN reaction occurring within the last 10 years: No If all of the above answers are "NO", then may proceed with Cephalosporin use.     Consultations:  Interventional Radiology for Thoracentesis  Gyn-Onc Dr. Precious Haws  Procedures/Studies: Dg Chest 1 View  Result Date: 11/27/2017 CLINICAL DATA:  Status post LEFT thoracentesis. EXAM: CHEST  1 VIEW COMPARISON:  Chest x-ray dated 11/27/2017. FINDINGS: There is persistent complete opacification LEFT hemithorax. No pneumothorax seen. RIGHT lung is clear. IMPRESSION: Persistent complete opacification of the LEFT hemithorax status post thoracentesis. Electronically Signed   By: Franki Cabot M.D.   On: 11/27/2017 15:57   Dg Chest 2 View  Result Date: 11/28/2017 CLINICAL DATA:  Followup left pleural effusion. Thoracentesis yesterday. EXAM: CHEST - 2 VIEW COMPARISON:  11/27/2017 FINDINGS: Large left pleural effusion has decreased in size since previous study. Improved aeration is seen in the  left lung apex. Right lung remains clear. Heart size is stable IMPRESSION: Mild decrease in large left pleural effusion, with improved aeration in the left lung apex. Electronically Signed   By: Earle Gell M.D.   On: 11/28/2017 13:59   Dg Chest 2 View  Result Date: 11/26/2017 CLINICAL DATA:  Shortness of breath, dry cough and left chest pain for 1 week. EXAM: CHEST - 2 VIEW COMPARISON:  None. FINDINGS: Complete whiteout of the left chest with mediastinal shift to the right. Right lung is clear. Cardiomediastinal predominantly obscured. No pneumothorax. Scoliosis. Soft tissue planes and included osseous structures are non suspicious. IMPRESSION: Complete white out of left chest with mediastinal shift to the right consistent with effusion and potential underlying mass. Findings would be better assessed on contrast enhanced CT. Acute findings discussed with and reconfirmed by Dr.RACHEL LITTLE on 11/26/2017 at 7:30 pm. Electronically Signed   By: Elon Alas M.D.   On: 11/26/2017 19:30   Ct Angio Chest Pe W/cm &/or Wo Cm  Result Date: 11/26/2017 CLINICAL DATA:  Initial evaluation for possible  pulmonary embolism, abdominal swelling. EXAM: CT ANGIOGRAPHY CHEST CT ABDOMEN AND PELVIS WITH CONTRAST TECHNIQUE: Multidetector CT imaging of the chest was performed using the standard protocol during bolus administration of intravenous contrast. Multiplanar CT image reconstructions and MIPs were obtained to evaluate the vascular anatomy. Multidetector CT imaging of the abdomen and pelvis was performed using the standard protocol during bolus administration of intravenous contrast. CONTRAST:  196mL ISOVUE-370 IOPAMIDOL (ISOVUE-370) INJECTION 76% COMPARISON:  Prior chest radiograph from earlier the same day. FINDINGS: CTA CHEST FINDINGS Cardiovascular: Intrathoracic aorta of normal caliber without aneurysm or other acute abnormality. Visualized great vessels within normal limits. Heart and mediastinal structures shifted to the right. Heart size within normal limits. No definite pericardial effusion. Evaluation of the pulmonary arterial tree somewhat limited due to large left pleural effusion and motion artifact. Evaluation for possible distal small emboli on this exam limited. Main pulmonary artery within normal limits for caliber measuring 2.6 cm in diameter. No central or large segmental filling defects seen within the right lung to suggest embolus. There is centrally complete collapse of the left lung, which is contracted towards the left hilum. The left pulmonary arteries are largely decompressed. No large central filling defects seen within the left main and proximal pulmonary arteries. Re-formatted imaging confirms these findings. Mediastinum/Nodes: Mediastinal structures shifted to the right due to the large left pleural effusion. Thyroid within normal limits. No definite enlarged mediastinal or hilar adenopathy, although evaluation somewhat limited. No enlarged axillary nodes. Esophagus grossly within normal limits. Lungs/Pleura: Tracheobronchial tree deviated to the right but remains patent as do the bronchi  supplying the right lung there is compression of the distal left mainstem and segmental bronchi at the left hilum with complete compression of the left lung. Large pleural effusion fills the left hemithorax. Effusion measures simple fluid density without internal complexity or loculation. Smaller layering right pleural effusion present as well. The pneumatized right lung is otherwise clear without infiltrate or edema. No pneumothorax. No visible pulmonary nodule or mass. Musculoskeletal: Anasarca noted. Dextroscoliosis with exaggeration of the normal thoracic kyphosis. Degenerative changes noted within the lower cervical spine. No acute osseous abnormality. No worrisome lytic or blastic osseous lesions. Review of the MIP images confirms the above findings. CT ABDOMEN and PELVIS FINDINGS Hepatobiliary: Multiple heterogeneous hypodense lesions present within the liver, largest of which is hypodense with irregular nodular enhancement measuring 3.5 x 4.0 x 1.9 cm (series 13,  image 14). Findings are indeterminate, but concerning for possible metastatic disease. Irregular subcapsular lesions with irregularity of the overlying right hepatic contour noted. Periportal edema. Gallbladder within normal limits. No appreciable biliary dilatation. Pancreas: Pancreas within normal limits. Spleen: Spleen displaced anteriorly but otherwise unremarkable. Adrenals/Urinary Tract: Adrenal glands grossly within normal limits. Kidneys equal in size with symmetric enhancement. Few scattered hypodense lesions within the left kidney too small the characterize. No nephrolithiasis, hydronephrosis, or focal enhancing renal mass. No appreciable hydroureter. Partially distended bladder demonstrates no acute abnormality. Stomach/Bowel: Stomach within normal limits. Small paraumbilical hernia containing short segment loop of small bowel and free fluid noted without associated obstruction. No definite acute inflammatory changes seen about the  bowels. Vascular/Lymphatic: Normal intravascular enhancement seen throughout the intra-abdominal aorta. Mesenteric vessels patent proximally. Multiple mesenteric lymph nodes and/or peritoneal implants present within the right mid abdomen, measuring up to 12 mm (series 13, image 38). Shoddy subcentimeter retroperitoneal lymph nodes noted. Reproductive: Large partially solid/partially cystic mass position within the central pelvis and lower abdomen measures 17.6 x 20.0 x 20.0 cm (series 13, image 63). Lesion demonstrates internal cystic components with superimposed large eccentric solid slight soft tissue components. Scattered internal calcifications. 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 Additional right adnexal cystic lesions measure up to 3.7 cm (series 15, image 76), indeterminate. Uterus seen inferiorly and is grossly within normal limits. The native ovaries not well delineated. Other: Large volume free fluid throughout the abdomen and pelvis, measuring simple fluid density. No free intraperitoneal air. Musculoskeletal: Diffuse anasarca. Patient overall somewhat cachectic in appearance. Scoliosis with advanced facet arthrosis noted within the lower lumbar spine. No acute osseous abnormality. No discrete lytic or blastic osseous lesions. Review of the MIP images confirms the above findings. IMPRESSION: CTA CHEST IMPRESSION 1. Mildly limited study due to large left pleural effusion and motion artifact. No CTA evidence for large or central pulmonary embolus. Evaluation for possible distal small emboli 2. Large left pleural effusion with complete collapse of the left lung with left-to-right mediastinal shift. 3. Smaller layering right pleural effusion. CT ABDOMEN AND PELVIS IMPRESSION 1. 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 2. Associated large volume ascites throughout the abdomen and pelvis.  3. Multiple heterogeneous hepatic lesions, concerning for possible metastatic disease. 4. Few scattered mildly enlarged mesenteric lymph nodes, which could be reactive or reflect nodal metastases. 5. Additional smaller right adnexal cystic lesions measuring up to 3.7 cm, indeterminate. 6. Anasarca. Electronically Signed   By: Jeannine Boga M.D.   On: 11/26/2017 21:53   Ct Abdomen Pelvis W Contrast  Result Date: 11/26/2017 CLINICAL DATA:  Initial evaluation for possible pulmonary embolism, abdominal swelling. EXAM: CT ANGIOGRAPHY CHEST CT ABDOMEN AND PELVIS WITH CONTRAST TECHNIQUE: Multidetector CT imaging of the chest was performed using the standard protocol during bolus administration of intravenous contrast. Multiplanar CT image reconstructions and MIPs were obtained to evaluate the vascular anatomy. Multidetector CT imaging of the abdomen and pelvis was performed using the standard protocol during bolus administration of intravenous contrast. CONTRAST:  121mL ISOVUE-370 IOPAMIDOL (ISOVUE-370) INJECTION 76% COMPARISON:  Prior chest radiograph from earlier the same day. FINDINGS: CTA CHEST FINDINGS Cardiovascular: Intrathoracic aorta of normal caliber without aneurysm or other acute abnormality. Visualized great vessels within normal limits. Heart and mediastinal structures shifted to the right. Heart size within normal limits. No definite pericardial effusion. Evaluation of the pulmonary arterial tree somewhat limited due to  large left pleural effusion and motion artifact. Evaluation for possible distal small emboli on this exam limited. Main pulmonary artery within normal limits for caliber measuring 2.6 cm in diameter. No central or large segmental filling defects seen within the right lung to suggest embolus. There is centrally complete collapse of the left lung, which is contracted towards the left hilum. The left pulmonary arteries are largely decompressed. No large central filling defects seen  within the left main and proximal pulmonary arteries. Re-formatted imaging confirms these findings. Mediastinum/Nodes: Mediastinal structures shifted to the right due to the large left pleural effusion. Thyroid within normal limits. No definite enlarged mediastinal or hilar adenopathy, although evaluation somewhat limited. No enlarged axillary nodes. Esophagus grossly within normal limits. Lungs/Pleura: Tracheobronchial tree deviated to the right but remains patent as do the bronchi supplying the right lung there is compression of the distal left mainstem and segmental bronchi at the left hilum with complete compression of the left lung. Large pleural effusion fills the left hemithorax. Effusion measures simple fluid density without internal complexity or loculation. Smaller layering right pleural effusion present as well. The pneumatized right lung is otherwise clear without infiltrate or edema. No pneumothorax. No visible pulmonary nodule or mass. Musculoskeletal: Anasarca noted. Dextroscoliosis with exaggeration of the normal thoracic kyphosis. Degenerative changes noted within the lower cervical spine. No acute osseous abnormality. No worrisome lytic or blastic osseous lesions. Review of the MIP images confirms the above findings. CT ABDOMEN and PELVIS FINDINGS Hepatobiliary: Multiple heterogeneous hypodense lesions present within the liver, largest of which is hypodense with irregular nodular enhancement measuring 3.5 x 4.0 x 1.9 cm (series 13, image 14). Findings are indeterminate, but concerning for possible metastatic disease. Irregular subcapsular lesions with irregularity of the overlying right hepatic contour noted. Periportal edema. Gallbladder within normal limits. No appreciable biliary dilatation. Pancreas: Pancreas within normal limits. Spleen: Spleen displaced anteriorly but otherwise unremarkable. Adrenals/Urinary Tract: Adrenal glands grossly within normal limits. Kidneys equal in size with  symmetric enhancement. Few scattered hypodense lesions within the left kidney too small the characterize. No nephrolithiasis, hydronephrosis, or focal enhancing renal mass. No appreciable hydroureter. Partially distended bladder demonstrates no acute abnormality. Stomach/Bowel: Stomach within normal limits. Small paraumbilical hernia containing short segment loop of small bowel and free fluid noted without associated obstruction. No definite acute inflammatory changes seen about the bowels. Vascular/Lymphatic: Normal intravascular enhancement seen throughout the intra-abdominal aorta. Mesenteric vessels patent proximally. Multiple mesenteric lymph nodes and/or peritoneal implants present within the right mid abdomen, measuring up to 12 mm (series 13, image 38). Shoddy subcentimeter retroperitoneal lymph nodes noted. Reproductive: Large partially solid/partially cystic mass position within the central pelvis and lower abdomen measures 17.6 x 20.0 x 20.0 cm (series 13, image 63). Lesion demonstrates internal cystic components with superimposed large eccentric solid slight soft tissue components. Scattered internal calcifications. 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 Additional right adnexal cystic lesions measure up to 3.7 cm (series 15, image 76), indeterminate. Uterus seen inferiorly and is grossly within normal limits. The native ovaries not well delineated. Other: Large volume free fluid throughout the abdomen and pelvis, measuring simple fluid density. No free intraperitoneal air. Musculoskeletal: Diffuse anasarca. Patient overall somewhat cachectic in appearance. Scoliosis with advanced facet arthrosis noted within the lower lumbar spine. No acute osseous abnormality. No discrete lytic or blastic osseous lesions. Review of the MIP images confirms the above findings. IMPRESSION: CTA CHEST IMPRESSION 1. Mildly limited study due to large left pleural effusion and motion artifact. No CTA  evidence  for large or central pulmonary embolus. Evaluation for possible distal small emboli 2. Large left pleural effusion with complete collapse of the left lung with left-to-right mediastinal shift. 3. Smaller layering right pleural effusion. CT ABDOMEN AND PELVIS IMPRESSION 1. 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 2. Associated large volume ascites throughout the abdomen and pelvis. 3. Multiple heterogeneous hepatic lesions, concerning for possible metastatic disease. 4. Few scattered mildly enlarged mesenteric lymph nodes, which could be reactive or reflect nodal metastases. 5. Additional smaller right adnexal cystic lesions measuring up to 3.7 cm, indeterminate. 6. Anasarca. Electronically Signed   By: Jeannine Boga M.D.   On: 11/26/2017 21:53   Dg Chest Portable 1 View  Result Date: 11/27/2017 CLINICAL DATA:  Pleural effusion. EXAM: PORTABLE CHEST 1 VIEW COMPARISON:  11/26/2017. FINDINGS: Similar appearance of complete whiteout of the left hemithorax. Right lung remains clear. Skin fold noted over the lateral right hemithorax. Thoracolumbar scoliosis again noted. IMPRESSION: Stable exam with large left pleural effusion as seen on yesterday's CT scan. Close follow-up recommended as unilateral effusion can be a sign of malignancy. Electronically Signed   By: Misty Stanley M.D.   On: 11/27/2017 12:03   US Pelvic Complete With Transvaginal  Result Date: 11/28/2017 CLINICAL DATA:  62 year old postmenopausal female with large complex pelvic mass on recent CT. EXAM: TRANSABDOMINAL AND TRANSVAGINAL ULTRASOUND OF PELVIS TECHNIQUE: Both transabdominal and transvaginal ultrasound examinations of the pelvis were performed. Transabdominal technique was performed for global imaging of the pelvis including uterus, ovaries, adnexal regions, and pelvic cul-de-sac. It was necessary to proceed with endovaginal exam  following the transabdominal exam to visualize the endometrium, myometrium and adnexa. COMPARISON:  11/26/2017 CT abdomen/pelvis. FINDINGS: Uterus Measurements: 20.0 x 15.5 x 19.2 cm. The anteverted uterus is markedly enlarged. There are several partially calcified large uterine fibroids, which were better appreciated on the 11/26/2017 CT study. The largest partially calcified fibroid is transmural, located in the left uterine body and measuring 11.0 x 9.3 x 8.7 cm. Endometrium Thickness: 14.3 cm. The endometrium is massively thickened and markedly heterogeneous. Right ovary There is a complex multilocular 6.1 x 4.3 x 5.1 cm right adnexal cystic mass containing mildly thickened internal septation, thick irregular wall, peripheral vascularity and layering debris, favored to be replacing the right ovary. A separate normal right ovary is not visualized. Left ovary Measurements: 3.1 x 2.0 x 1.9 cm. Normal appearance/no adnexal mass. Other findings Moderate free fluid throughout the pelvis. IMPRESSION: 1. Massively thickened (14.3 cm) and markedly heterogeneous endometrium, highly suspicious for primary endometrial malignancy. 2. Markedly enlarged myomatous uterus with several bulky partially calcified uterine fibroids, largest 11.0 cm in the left uterine body, better appreciated on the 11/26/2017 CT study. 3. Complex multilocular 6.1 cm right adnexal cystic mass with aggressive features. A separate normal right ovary is not visualized. Primary right ovarian cystic epithelial malignancy cannot be excluded. 4. Nonspecific moderate free fluid throughout the pelvis, cannot exclude malignant ascites. Electronically Signed   By: Ilona Sorrel M.D.   On: 11/28/2017 12:15   US Thoracentesis Asp Pleural Space W/img Guide  Result Date: 11/27/2017 INDICATION: Patient with history of pelvic cystic mass concerning for ovarian neoplasm, ascites, liver lesions, large left pleural effusion. Request made for diagnostic and therapeutic  left thoracentesis. EXAM: ULTRASOUND GUIDED DIAGNOSTIC AND THERAPEUTIC LEFT THORACENTESIS MEDICATIONS: None COMPLICATIONS: None immediate. PROCEDURE: An ultrasound guided thoracentesis was thoroughly discussed with the  patient and questions answered. The benefits, risks, alternatives and complications were also discussed. The patient understands and wishes to proceed with the procedure. Written consent was obtained. Ultrasound was performed to localize and mark an adequate pocket of fluid in the left chest. The area was then prepped and draped in the normal sterile fashion. 1% Lidocaine was used for local anesthesia. Under ultrasound guidance a 6 Fr Safe-T-Centesis catheter was introduced. Thoracentesis was performed. The catheter was removed and a dressing applied. FINDINGS: A total of approximately 2.2 liters of bloody fluid was removed. Samples were sent to the laboratory as requested by the clinical team. Due to this being patient's initial thoracentesis only the above amount of fluid was removed today. IMPRESSION: Successful ultrasound guided diagnostic and therapeutic left thoracentesis yielding 2.2 liters of pleural fluid. Read by: Rowe Robert, PA-C Electronically Signed   By: Sandi Mariscal M.D.   On: 11/27/2017 15:44    Subjective:   Discharge Exam: Vitals:   11/28/17 0541 11/28/17 1333  BP: 130/65 124/82  Pulse: 65 (!) 105  Resp: 20 16  Temp: 98.9 F (37.2 C) 98 F (36.7 C)  SpO2: 96% 99%   Vitals:   11/27/17 1731 11/28/17 0535 11/28/17 0541 11/28/17 1333  BP: 128/71 116/65 130/65 124/82  Pulse: (!) 103 83 65 (!) 105  Resp: (!) 22 20 20 16   Temp: 98.8 F (37.1 C) 98.7 F (37.1 C) 98.9 F (37.2 C) 98 F (36.7 C)  TempSrc: Oral Oral Oral Oral  SpO2: 98% 96% 96% 99%  Weight:      Height:       General: Pt is alert, awake, not in acute distress Cardiovascular: RRR, S1/S2 +, no rubs, no gallops Respiratory: DIminished bilaterally especially on Left, no wheezing, no rhonchi;  Unlabored breathing  Abdominal: Soft, NT, Distended with ascites, bowel sounds + Extremities: 1+ edema, no cyanosis  The results of significant diagnostics from this hospitalization (including imaging, microbiology, ancillary and laboratory) are listed below for reference.    Microbiology: Recent Results (from the past 240 hour(s))  Body fluid culture     Status: None (Preliminary result)   Collection Time: 11/27/17  3:14 PM  Result Value Ref Range Status   Specimen Description   Final    PLEURAL LEFT Performed at [facility], [place] [address], [place], [place] [address]    Special Requests   Final    NONE Performed at [facility], [place] [address], [place], [place] [address]    Gram Stain   Final    WBC PRESENT, PREDOMINANTLY MONONUCLEAR NO ORGANISMS SEEN CYTOSPIN SMEAR Gram Stain Report Called to,Read Back By and Verified With: Sherrlyn Hock [id number] @ [address] Performed at [place] [address] [place], [place] [address]    Culture   Final    NO GROWTH < 24 HOURS Performed at [place] [address], [place], [place] [address]    Report Status PENDING  Incomplete    Labs: BNP (last 3 results) Recent Labs    11/26/17 1921  BNP 47.4   Basic Metabolic Panel: Recent Labs  Lab 11/26/17 1921 11/27/17 1244 11/28/17 0124  NA 135 138 138  K 3.8 4.4 3.7  CL 103 104 105  CO2 24 25 26   GLUCOSE 108* 102* 102*  BUN 12 11 10   CREATININE 0.89 0.72 0.61  CALCIUM 8.4* 8.7* 8.3*   Liver Function Tests: Recent Labs  Lab 11/26/17 1921  11/27/17 1244  AST 14* 13*  ALT 8* 9*  ALKPHOS 51 44  BILITOT 0.5 0.5  PROT 9.0* 8.3*  ALBUMIN 2.6* 2.5*   No results for input(s): LIPASE, AMYLASE in the last 168 hours. No results for input(s): AMMONIA in the last 168 hours. CBC: Recent Labs  Lab 11/26/17 1921 11/27/17 1244 11/28/17 0909  WBC 7.5 7.7 6.2  NEUTROABS 5.6  --  4.5  HGB 8.8* 8.2*  8.7*  HCT 28.9* 27.3* 29.3*  MCV 69.5* 71.8* 71.3*  PLT 828* 757* 738*   Cardiac Enzymes: Recent Labs  Lab 11/26/17 1921 11/27/17 1359 11/27/17 2039 11/28/17 0124  TROPONINI 0.08* 0.08* 0.08* 0.07*   BNP: Invalid input(s): POCBNP CBG: No results for input(s): GLUCAP in the last 168 hours. D-Dimer No results for input(s): DDIMER in the last 72 hours. Hgb A1c No results for input(s): HGBA1C in the last 72 hours. Lipid Profile No results for input(s): CHOL, HDL, LDLCALC, TRIG, CHOLHDL, LDLDIRECT in the last 72 hours. Thyroid function studies No results for input(s): TSH, T4TOTAL, T3FREE, THYROIDAB in the last 72 hours.  Invalid input(s): FREET3 Anemia work up Recent Labs    11/27/17 1359  FERRITIN 367*  TIBC 167*  IRON 10*   Urinalysis    Component Value Date/Time   COLORURINE YELLOW 11/26/2017 1906   APPEARANCEUR CLEAR 11/26/2017 1906   LABSPEC 1.020 11/26/2017 1906   PHURINE 5.5 11/26/2017 1906   GLUCOSEU NEGATIVE 11/26/2017 1906   HGBUR MODERATE (A) 11/26/2017 [place] NEGATIVE 11/26/2017 1906   KETONESUR 15 (A) 11/26/2017 1906   PROTEINUR NEGATIVE 11/26/2017 1906   NITRITE NEGATIVE 11/26/2017 1906   LEUKOCYTESUR NEGATIVE 11/26/2017 1906   Sepsis Labs Invalid input(s): PROCALCITONIN,  WBC,  LACTICIDVEN Microbiology Recent Results (from the past 240 hour(s))  Body fluid culture     Status: None (Preliminary result)   Collection Time: 11/27/17  3:14 PM  Result Value Ref Range Status   Specimen Description   Final    PLEURAL LEFT Performed at [facility], [place] [address] [place], [place] [address]    Special Requests   Final    NONE Performed at [facility], [place] [address] [place], [place] [address]    Gram Stain   Final    WBC PRESENT, PREDOMINANTLY MONONUCLEAR NO ORGANISMS SEEN CYTOSPIN SMEAR Gram Stain Report Called to,Read Back By and Verified With: Sherrlyn Hock [id number] @ [address] Performed at [place] [address], [place], [place] [address]    Culture   Final    NO GROWTH < 24 HOURS Performed at [place] [address] [place], [place] [address]    Report Status PENDING  Incomplete   Time coordinating discharge: 35 minutes  SIGNED:  Kerney Elbe, DO [facility] 11/28/2017, 2:33 PM Pager [phone]  If 7PM-7AM, please contact night-coverage [email or web address] Password [email or web address]

## 2017-11-28 NOTE — Consult Note (Signed)
Consult Note: Gyn-Onc  Consult was requested by [facility] ER and Dr. Shelly Coss   CC:  Chief Complaint  Patient presents with  . Shortness of Breath    HPI: [name]  is a very nice 62 y.o.  P1  She noted SOB and presented to the ER. Tells me she was seen initially at [facility] but then was told to come to [facility].  Here in ER workup revealed pleural effusion which was tapped since admit. On imaging additional findings include a pelvic mass, initially described as ovarian and ascites plus possible liver mets.  I was consulted due to the possibility of ovarian cancer.  Patient states main issue is the SOB. Did note abdominal fullness last Fall (2018) and started dieting and thought the abdomen got flatter. Then states people starting telling her she was getting too thin so she started eating again and noted her abdomen increasing again over the past month. She has noted postmenopausal bleeding but cannot quantify when this started. At least for the past month if not longer. States has an appetite and denies early satiety.   Measurement of disease:  Recent Labs    11/27/17 1359  CAN125 142.0*     Radiology: . At time of my visit a CT scan is reviewed in the presence of the patient. See A/P  Current Meds: @ENCMED @  Allergy:  Allergies  Allergen Reactions  . Codeine   . Penicillins     Has patient had a PCN reaction causing immediate rash, facial/tongue/throat swelling, SOB or lightheadedness with hypotension: {Yes Has patient had a PCN reaction causing severe rash involving mucus membranes or skin necrosis:No Has patient had a PCN reaction that required hospitalization: No Has patient had a PCN reaction occurring within the last 10 years: No If all of the above answers are "NO", then may proceed with Cephalosporin use.     Social Hx:  Tobacco use: in her 33's about 1/4 pack per day for less than 10 years Alcohol use: denies Illicit Drug use: denies Illicit IV  Drug use: denies  Past Surgical Hx:  Past Surgical History:  Procedure Laterality Date  . HERNIA REPAIR      Past Medical Hx:  Past Medical History:  Diagnosis Date  . Uterine fibroid     Past Gynecological History:   GYNECOLOGIC HISTORY:  No LMP recorded. Patient is postmenopausal. P 1  LMP 65's  Family Hx:  Family History  Problem Relation Age of Onset  . Cancer Father     Review of Systems:  Negative except as in HPI  Vitals:  Blood pressure 124/82, pulse (!) 105, temperature 98 F (36.7 C), temperature source Oral, resp. rate 16, height 5\' 11"  (1.803 m), weight 153 lb (69.4 kg), SpO2 99 %. Body mass index is 21.34 kg/m.   Physical Exam: ECOG PERFORMANCE STATUS: 1 - Symptomatic but completely ambulatory   General :  Well developed, 62 y.o., female in no apparent distress HEENT:  Normocephalic/atraumatic, symmetric, EOMI, eyelids normal Abdomen: visibly distended but nontender. Ext - no edema   Assessment/Plan: 1. Pelvic mass o On my review I think the pelvic mass is actually her uterus. It appears markedly abnormal possibly with hematometra. 2 fibroids are seen on the lateral surface of the mass with calcifications, consistent with her verbal h/o fibroids. o She admits to postmenopausal bleeding o I suspect a uterine primary 2. Workup o Discussed already with medicine a pelvic ultrasound will confirm my suspicion for the uterine  mass  o If I am correct then she would benefit from histologic evaluation via a D&C. o If she is stable for discharge I can do this outpatient. o If she remains in the hospital then I would be able to schedule Monday afternoon. 3. Liver lesion o If thoracentesis cytology positive liver can be assessed at some point o Would like to get MRI, but she is uninsured and this won't change her management/stage if there is a malignant effusion. 4. If metastatic uterine cancer recommendation will be for chemotherapy and I did explain this  to the patient.    Isabel Caprice, MD  11/28/2017, 7:15 PM

## 2017-11-30 ENCOUNTER — Telehealth: Payer: Self-pay

## 2017-11-30 NOTE — Telephone Encounter (Signed)
Outgoing call to pt per Joylene John NP to schedule D&C for this Friday June 14th.  Pt verbalized she needs to think about if day will work for her and she will call us back.

## 2017-11-30 NOTE — Telephone Encounter (Signed)
Outgoing call to pt per Joylene John NP "Can you see if patient is good with having procedure this Friday morning at [facility]? If so, she will get a call from nurse to discuss over the phone"  Contacted pt earlier today and she said she will call me back today- didn't receive a call back so I called her.  Pt said she hasn't decided about this Friday and will call us back tomorrow.  I let patient know if it has to do with schedule as in another day better for her or if she has concerns and needs to talk to the doctor further - let me know.  Pt repeated back to me what I had said but didn't have an answer of whether it had to do with schedule or needed to speak further with Dr Gerarda Fraction, and the phone connection was not good- pt said in closing of conversation, she would call back tomorrow. Will let Melissa NP know.

## 2017-12-01 LAB — BODY FLUID CULTURE: Culture: NO GROWTH

## 2017-12-03 ENCOUNTER — Telehealth: Payer: Self-pay

## 2017-12-03 NOTE — Telephone Encounter (Signed)
Outgoing call to patient per Tracy John NP regarding checking with patient to see if she can come to [facility] to see medical oncologist, Dr Alvy Bimler, no answer, left pt a VM with our contact info.

## 2017-12-04 ENCOUNTER — Telehealth: Payer: Self-pay

## 2017-12-04 NOTE — Telephone Encounter (Signed)
Outgoing call to patient per Joylene John NP to see if pt would like to be scheduled for [facility] and an appt with Dr Alvy Bimler- no answer- left her VM including that Dr Gerarda Fraction would [place] to schedule for an appt and importance of calling us back.

## 2017-12-08 ENCOUNTER — Telehealth: Payer: Self-pay | Admitting: Oncology

## 2017-12-08 NOTE — Telephone Encounter (Signed)
Left a message for patient regarding scheduling an appointment with Med Onc and also for D&C.  Requested a return call as soon as possible.

## 2017-12-09 ENCOUNTER — Telehealth: Payer: Self-pay | Admitting: Oncology

## 2017-12-09 NOTE — Telephone Encounter (Signed)
Called patient and asked her if she would like to schedule a D&C.  She is still trying to decide and will call once she makes a decision.  She did say she does not know what the point of doing the [facility] is.  She does not want "to be cut up" and she has been dealing with this for a long time.  Advised her it is to biopsy the area to help with treatment decisions.    Also asked her if she would be interested in seeing a medical oncologist in [place] or [place].  She said she would like an appointment in [place].  Advised her that we will be calling with an appointment.

## 2017-12-10 ENCOUNTER — Other Ambulatory Visit: Payer: Self-pay | Admitting: Family Medicine

## 2017-12-10 ENCOUNTER — Ambulatory Visit: Payer: Self-pay | Attending: Family Medicine | Admitting: Physician Assistant

## 2017-12-10 ENCOUNTER — Other Ambulatory Visit: Payer: Self-pay | Admitting: Physician Assistant

## 2017-12-10 ENCOUNTER — Ambulatory Visit (HOSPITAL_COMMUNITY)
Admission: RE | Admit: 2017-12-10 | Discharge: 2017-12-10 | Disposition: A | Discharge status: Home / Self Care | Payer: Self-pay | Source: Ambulatory Visit | Attending: Physician Assistant | Admitting: Physician Assistant

## 2017-12-10 ENCOUNTER — Other Ambulatory Visit (HOSPITAL_COMMUNITY): Payer: Self-pay

## 2017-12-10 ENCOUNTER — Other Ambulatory Visit: Payer: Self-pay | Admitting: *Deleted

## 2017-12-10 ENCOUNTER — Telehealth: Payer: Self-pay | Admitting: Oncology

## 2017-12-10 ENCOUNTER — Telehealth: Payer: Self-pay | Admitting: *Deleted

## 2017-12-10 VITALS — BP 125/85 | HR 119 | Temp 99.1°F | Resp 16 | Ht 71.0 in | Wt 143.4 lb

## 2017-12-10 DIAGNOSIS — J90 Pleural effusion, not elsewhere classified: Secondary | ICD-10-CM

## 2017-12-10 DIAGNOSIS — R1900 Intra-abdominal and pelvic swelling, mass and lump, unspecified site: Secondary | ICD-10-CM | POA: Insufficient documentation

## 2017-12-10 DIAGNOSIS — Z79899 Other long term (current) drug therapy: Secondary | ICD-10-CM | POA: Insufficient documentation

## 2017-12-10 MED ORDER — CETIRIZINE HCL 10 MG PO TABS: 10.0000 mg | ORAL_TABLET | Freq: Every day | ORAL | 11 refills | Status: AC

## 2017-12-10 MED ORDER — FUROSEMIDE 20 MG PO TABS: 20.0000 mg | ORAL_TABLET | Freq: Every day | ORAL | 3 refills | Status: DC

## 2017-12-10 NOTE — Patient Instructions (Addendum)
Think about calling Dr. Gerarda Fraction office for an appointment ASAP

## 2017-12-10 NOTE — Telephone Encounter (Signed)
Please inform patient of this message:    Su Ley  Carilyn Goodpasture, RN        Let her know that the fluid has come back on her left lung. We need to place a referral to IR for a thoracentesis ASAP. I'll place the referral. Pls call her with next steps. Hope to get this done Fri or Sat.    Appointment scheduled for Friday at 12:45 for IR.

## 2017-12-10 NOTE — Telephone Encounter (Signed)
Left a message regarding appointment with Dr. Alvy Bimler on Monday at 11:15.  Requested a return call.

## 2017-12-10 NOTE — Progress Notes (Signed)
Pt. Is here for hospital follow-up.

## 2017-12-10 NOTE — Progress Notes (Signed)
Tracy Flynn  WUJ:[id number]  [id number]  DOB - [birth year]  Chief Complaint  Patient presents with  . Hospitalization Follow-up       Subjective:   Tracy Flynn is a 62 y.o. female here today for establishment of care.  She does not really have a lot of past medical history normally treats herself with herbs.  She presented to the outpatient urgent care on 11/26/2017 with shortness of breath and back pain.  She had been experiencing her symptoms for 1 week without fever or cough.  She also been having some mild swelling in her lower extremities.  There was abdominal distention as well.  She was hypertensive and tachycardic.  She had decreased breath sounds from the left lung.  She had a distended abdomen and edema in her legs.  Her hemoglobin was 8.8.  Her left chest was almost totally whited out.  A CT of the chest showed a large left pleural effusion with left lung collapse no pulmonary emboli.  The pelvic portion of the CT showed a cystic mass questionable for ovarian cancer.  She was advised admission but left AGAINST MEDICAL ADVICE.  One day later she went to [facility] with the same symptoms.  They were able to admit her for 24 hours.  She was able to see GYN oncology, Dr. Gerarda Fraction.  She underwent left-sided thoracentesis with 2.2 L fluid removed.  She also had a pelvic and transvaginal ultrasound.  She is supposed to have an outpatient D&C and potential chemotherapy.  She is not sure that she wants to do this.  She has not made a follow-up appointment with Dr. Gerarda Fraction.  She knows that she does not want to undergo chemotherapy at any point.  She is uninsured.  Today she stating that she feels congested through her head nose and ears.  Her shortness of breath has improved.  She does have some swelling in her lower extremities.  She does have some swelling in her abdominal area.  She also has an abdominal hernia that she says has worsened over the last 6 months.  Appetite is about  the same.  No additional weight loss.  No new complaints.  ROS: GEN: denies fever or chills, + change in weight Skin: denies lesions or rashes HEENT: denies headache, earache, epistaxis, sore throat, or neck pain LUNGS: + SHOB, dyspnea, PND, orthopnea CV: denies CP or palpitations ABD: denies abd pain, N or V EXT: denies muscle spasms + swelling; no pain in lower ext, no weakness NEURO: denies numbness or tingling, denies sz, stroke or TIA   ALLERGIES: Allergies  Allergen Reactions  . Codeine   . Penicillins     Has patient had a PCN reaction causing immediate rash, facial/tongue/throat swelling, SOB or lightheadedness with hypotension: {Yes Has patient had a PCN reaction causing severe rash involving mucus membranes or skin necrosis:No Has patient had a PCN reaction that required hospitalization: No Has patient had a PCN reaction occurring within the last 10 years: No If all of the above answers are "NO", then may proceed with Cephalosporin use.     PAST MEDICAL HISTORY: Past Medical History:  Diagnosis Date  . Uterine fibroid     PAST SURGICAL HISTORY: Past Surgical History:  Procedure Laterality Date  . HERNIA REPAIR      MEDICATIONS AT HOME: Prior to Admission medications   Medication Sig Start Date End Date Taking? Authorizing Provider  ipratropium-albuterol (DUONEB) 0.5-2.5 (3) MG/3ML SOLN Take 3 mLs by nebulization every  6 (six) hours as needed. 11/28/17  Yes Sheikh, Omair Latif, DO  cetirizine (ZYRTEC) 10 MG tablet Take 1 tablet (10 mg total) by mouth daily. 12/10/17   Brayton Caves, PA-C  furosemide (LASIX) 20 MG tablet Take 1 tablet (20 mg total) by mouth daily. 12/10/17   Brayton Caves, PA-C  ibuprofen (ADVIL,MOTRIN) 200 MG tablet Take 200 mg by mouth daily as needed (PAIN).    [provider]    Family History  Problem Relation Age of Onset  . 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 Speech Clear. Not toxic looking HEENT: Atraumatic and Normocephalic, pupils equally reactive to light and accomodation Neck: supple, no JVD. No cervical lymphadenopathy.  Chest:slight dec breath sounds left base CVS: tachy but regular, no murmurs.  Abdomen: Bowel sounds present, +ascites Extremities: B/L Lower Ext shows 1+ edema, both legs are warm to touch Neurology: Awake alert, and oriented X 3, CN II-XII intact, Non focal Skin:No Rash   Assessment & Plan  1. Large left pleural effusion   -s/p thoracentesis 11/27/17, 2.2 L removed  -repeat chest xray  -IR consult with repeat thor if needed  -lasix 2. Pelvic Mass  -has been seen by Dr. Gerarda Fraction with outpt recs made;  pt not sure that she wants anything done   -needs D & C and possibly chemo  -Dr. Gerarda Fraction info offered once again  -repeat labs today 3. Tachy  -likely reactive  -not treating right now  4. Allergies-zyrtec  Return in about 2 weeks (around 12/24/2017).  The patient was given clear instructions to go to ER or return to medical center if symptoms don't improve, worsen or new problems develop. The patient verbalized understanding. The patient was told to call to get lab results if they haven't heard anything in the next week.   Total time spent with patient was 49 min . Greater than 50 % of this visit was spent face to face counseling and coordinating care regarding risk factor modification, compliance importance and encouragement, education related to hospitalization recap.  This note has been created with Surveyor, quantity. Any transcriptional errors are unintentional.    Zettie Pho, PA-C [facility] and [place], [place]   12/10/2017, 11:58 AM

## 2017-12-10 NOTE — Telephone Encounter (Signed)
-----   Message from Brayton Caves, [place] sent at 12/10/2017  3:41 PM EDT ----- Let her know that the fluid has come back on her left lung. We need to place a referral to IR for a thoracentesis ASAP. I'll place the referral. Pls call her with next steps. Hope to get this done Fri or Sat.

## 2017-12-10 NOTE — Addendum Note (Signed)
Addended byBrayton Caves on: 12/10/2017 03:42 PM   Modules accepted: Orders

## 2017-12-11 ENCOUNTER — Ambulatory Visit (HOSPITAL_COMMUNITY)
Admission: RE | Admit: 2017-12-11 | Discharge: 2017-12-11 | Disposition: A | Discharge status: Home / Self Care | Payer: Self-pay | Source: Ambulatory Visit | Attending: Family Medicine | Admitting: Family Medicine

## 2017-12-11 ENCOUNTER — Other Ambulatory Visit: Payer: Self-pay | Admitting: *Deleted

## 2017-12-11 ENCOUNTER — Ambulatory Visit (HOSPITAL_COMMUNITY)
Admission: RE | Admit: 2017-12-11 | Discharge: 2017-12-11 | Disposition: A | Discharge status: Home / Self Care | Payer: Self-pay | Source: Ambulatory Visit | Attending: Physician Assistant | Admitting: Physician Assistant

## 2017-12-11 ENCOUNTER — Other Ambulatory Visit: Payer: Self-pay | Admitting: Family Medicine

## 2017-12-11 ENCOUNTER — Encounter (HOSPITAL_COMMUNITY): Payer: Self-pay | Admitting: Student

## 2017-12-11 ENCOUNTER — Telehealth: Payer: Self-pay | Admitting: *Deleted

## 2017-12-11 DIAGNOSIS — J90 Pleural effusion, not elsewhere classified: Secondary | ICD-10-CM | POA: Insufficient documentation

## 2017-12-11 DIAGNOSIS — R1900 Intra-abdominal and pelvic swelling, mass and lump, unspecified site: Secondary | ICD-10-CM

## 2017-12-11 HISTORY — PX: IR THORACENTESIS ASP PLEURAL SPACE W/IMG GUIDE: IMG5380

## 2017-12-11 LAB — CBC WITH DIFFERENTIAL/PLATELET
BASOS ABS: 0 10*3/uL (ref 0.0–0.2)
Basos: 0 %
EOS (ABSOLUTE): 0 10*3/uL (ref 0.0–0.4)
Eos: 0 %
HEMOGLOBIN: 8.3 g/dL — AB (ref 11.1–15.9)
Hematocrit: 29.4 % — ABNORMAL LOW (ref 34.0–46.6)
IMMATURE GRANULOCYTES: 0 %
Immature Grans (Abs): 0 10*3/uL (ref 0.0–0.1)
Lymphocytes Absolute: 0.7 10*3/uL (ref 0.7–3.1)
Lymphs: 10 %
MCH: 20.5 pg — ABNORMAL LOW (ref 26.6–33.0)
MCHC: 28.2 g/dL — ABNORMAL LOW (ref 31.5–35.7)
MCV: 73 fL — ABNORMAL LOW (ref 79–97)
MONOCYTES: 14 %
Monocytes Absolute: 1 10*3/uL — ABNORMAL HIGH (ref 0.1–0.9)
NEUTROS ABS: 5.3 10*3/uL (ref 1.4–7.0)
Neutrophils: 76 %
PLATELETS: 861 10*3/uL — AB (ref 150–450)
RBC: 4.04 x10E6/uL (ref 3.77–5.28)
RDW: 17.8 % — ABNORMAL HIGH (ref 12.3–15.4)
WBC: 7.1 10*3/uL (ref 3.4–10.8)

## 2017-12-11 LAB — BASIC METABOLIC PANEL
BUN/Creatinine Ratio: 16 (ref 12–28)
BUN: 12 mg/dL (ref 8–27)
CALCIUM: 9.5 mg/dL (ref 8.7–10.3)
CHLORIDE: 99 mmol/L (ref 96–106)
CO2: 21 mmol/L (ref 20–29)
Creatinine, Ser: 0.76 mg/dL (ref 0.57–1.00)
GFR calc non Af Amer: 84 mL/min/{1.73_m2} (ref >59)
GFR, EST AFRICAN AMERICAN: 97 mL/min/{1.73_m2} (ref >59)
Glucose: 90 mg/dL (ref 65–99)
Potassium: 4.9 mmol/L (ref 3.5–5.2)
Sodium: 134 mmol/L (ref 134–144)

## 2017-12-11 MED ORDER — LIDOCAINE HCL (PF) 2 % IJ SOLN
INTRAMUSCULAR | Status: AC
  Filled 2017-12-11: qty 10

## 2017-12-11 MED ORDER — LIDOCAINE HCL (PF) 2 % IJ SOLN
INTRAMUSCULAR | Status: AC | PRN
  Administered 2017-12-11: 10 mL

## 2017-12-11 MED ORDER — AZITHROMYCIN 250 MG PO TABS: ORAL_TABLET | ORAL | 0 refills | Status: DC

## 2017-12-11 NOTE — Procedures (Signed)
PROCEDURE SUMMARY:  Successful US guided therapeutic left thoracentesis. Yielded 2.0 liters of bloody fluid. Pt with fair tolerance of procedure. Procedure was stopped prior to removal of all fluid due to patient developing significant discomfort.  No immediate complications.  Specimen was not sent for labs. CXR ordered.  Docia Barrier PA-C 12/11/2017 3:14 PM

## 2017-12-11 NOTE — Telephone Encounter (Signed)
Called and left the patient a message to call the office regarding scheduling an important appt. Asked that she call the office today before 4:30pm or start on Monday at 8am

## 2017-12-11 NOTE — Telephone Encounter (Signed)
Attempted to call the patient several times today with no response. Left two voicemail messages at 9:25am and 11/:18am. Called again at 2:30pm, did not leave a message.

## 2017-12-11 NOTE — Telephone Encounter (Signed)
Order cosigned.

## 2017-12-11 NOTE — Progress Notes (Signed)
GYN

## 2017-12-14 ENCOUNTER — Telehealth: Payer: Self-pay | Admitting: Oncology

## 2017-12-14 ENCOUNTER — Telehealth: Payer: Self-pay | Admitting: *Deleted

## 2017-12-14 NOTE — Telephone Encounter (Signed)
Left a message to discuss medical oncology appointment.  Requested a return call.

## 2017-12-14 NOTE — Telephone Encounter (Signed)
Attempted to reach the patient this morning, phone went straight to voicemail

## 2017-12-14 NOTE — Telephone Encounter (Signed)
Called patient's son and asked if he could have her call back about scheduling a medical oncology appointment.  He said he would have her call us back.

## 2017-12-15 ENCOUNTER — Telehealth: Payer: Self-pay | Admitting: Hematology and Oncology

## 2017-12-15 ENCOUNTER — Telehealth: Payer: Self-pay | Admitting: *Deleted

## 2017-12-15 NOTE — Telephone Encounter (Signed)
Left another message for patient.  Requested a return call.

## 2017-12-15 NOTE — Telephone Encounter (Signed)
Previous note opened/entered in error. Please disregard.

## 2017-12-15 NOTE — Telephone Encounter (Signed)
Medical Assistant left message on patient's home and cell voicemail. Voicemail states to give a call back to [place] with [facility] at [phone]. !!!Patient should pick up antibiotic to address underlying pneumonia, patient still has fluid present even after completing the procedure. Patient will need to report to the ER ASAP if shortness of breath is continuing. Gynecology is also attempting to reach the patient for the ovarian mass.!!!

## 2017-12-15 NOTE — Telephone Encounter (Signed)
No 6/25 los/orders/referrals.

## 2017-12-15 NOTE — Telephone Encounter (Signed)
-----   Message from Charlott Rakes, MD sent at 12/11/2017  2:43 PM EDT ----- Chest x-ray reveals the presence of fluid even after her thoracocentesis and also possibility of underlying pneumonia.  I have sent a prescription for antibiotic to her pharmacy but if shortness of breath continues I will need her to present to the emergency room.  I will also need her to follow-up with GYN regarding her ovarian mass.

## 2017-12-16 ENCOUNTER — Telehealth: Payer: Self-pay | Admitting: Oncology

## 2017-12-16 NOTE — Telephone Encounter (Signed)
Left another message on patient's voicemail regarding scheduling a medical oncology appointment.  Requested a return call.

## 2017-12-17 ENCOUNTER — Telehealth: Payer: Self-pay | Admitting: *Deleted

## 2017-12-17 NOTE — Telephone Encounter (Signed)
Called and left the patient a message to call the office back to a schedule an appt

## 2017-12-18 ENCOUNTER — Telehealth: Payer: Self-pay | Admitting: Oncology

## 2017-12-18 ENCOUNTER — Telehealth: Payer: Self-pay | Admitting: *Deleted

## 2017-12-18 ENCOUNTER — Encounter: Payer: Self-pay | Admitting: *Deleted

## 2017-12-18 NOTE — Telephone Encounter (Signed)
Left a message for patient about scheduling medical oncology appointment.

## 2017-12-18 NOTE — Telephone Encounter (Signed)
Faxed the letter to the patient

## 2017-12-22 ENCOUNTER — Telehealth: Payer: Self-pay | Admitting: Oncology

## 2017-12-22 NOTE — Telephone Encounter (Signed)
Left a message for patient regarding scheduling an appointment.  Requested a return call.

## 2017-12-28 ENCOUNTER — Emergency Department (HOSPITAL_COMMUNITY)
Admission: EM | Admit: 2017-12-28 | Discharge: 2017-12-29 | Disposition: A | Discharge status: Home / Self Care | Payer: Self-pay | Attending: Emergency Medicine | Admitting: Emergency Medicine

## 2017-12-28 ENCOUNTER — Other Ambulatory Visit: Payer: Self-pay

## 2017-12-28 ENCOUNTER — Ambulatory Visit: Payer: Self-pay | Attending: Nurse Practitioner | Admitting: Nurse Practitioner

## 2017-12-28 ENCOUNTER — Encounter (HOSPITAL_COMMUNITY): Payer: Self-pay

## 2017-12-28 ENCOUNTER — Encounter: Payer: Self-pay | Admitting: Nurse Practitioner

## 2017-12-28 ENCOUNTER — Telehealth: Payer: Self-pay | Admitting: *Deleted

## 2017-12-28 ENCOUNTER — Emergency Department (HOSPITAL_COMMUNITY): Payer: Self-pay

## 2017-12-28 VITALS — BP 126/83 | HR 111 | Temp 98.8°F | Resp 14 | Ht 71.0 in | Wt 155.6 lb

## 2017-12-28 DIAGNOSIS — D4959 Neoplasm of unspecified behavior of other genitourinary organ: Secondary | ICD-10-CM | POA: Insufficient documentation

## 2017-12-28 DIAGNOSIS — J90 Pleural effusion, not elsewhere classified: Secondary | ICD-10-CM | POA: Insufficient documentation

## 2017-12-28 DIAGNOSIS — Z79899 Other long term (current) drug therapy: Secondary | ICD-10-CM | POA: Insufficient documentation

## 2017-12-28 DIAGNOSIS — Z87891 Personal history of nicotine dependence: Secondary | ICD-10-CM | POA: Insufficient documentation

## 2017-12-28 DIAGNOSIS — R109 Unspecified abdominal pain: Secondary | ICD-10-CM | POA: Insufficient documentation

## 2017-12-28 LAB — CBC
HCT: 28.8 % — ABNORMAL LOW (ref 36.0–46.0)
Hemoglobin: 8.2 g/dL — ABNORMAL LOW (ref 12.0–15.0)
MCH: 20.7 pg — ABNORMAL LOW (ref 26.0–34.0)
MCHC: 28.5 g/dL — AB (ref 30.0–36.0)
MCV: 72.5 fL — ABNORMAL LOW (ref 78.0–100.0)
Platelets: 746 10*3/uL — ABNORMAL HIGH (ref 150–400)
RBC: 3.97 MIL/uL (ref 3.87–5.11)
RDW: 19.4 % — AB (ref 11.5–15.5)
WBC: 7.6 10*3/uL (ref 4.0–10.5)

## 2017-12-28 LAB — BRAIN NATRIURETIC PEPTIDE: B Natriuretic Peptide: 34.5 pg/mL (ref 0.0–100.0)

## 2017-12-28 LAB — BASIC METABOLIC PANEL
Anion gap: 11 (ref 5–15)
BUN: 13 mg/dL (ref 8–23)
CALCIUM: 8.8 mg/dL — AB (ref 8.9–10.3)
CO2: 23 mmol/L (ref 22–32)
Chloride: 104 mmol/L (ref 98–111)
Creatinine, Ser: 0.72 mg/dL (ref 0.44–1.00)
GFR calc Af Amer: >60 mL/min (ref >60)
GLUCOSE: 86 mg/dL (ref 70–99)
POTASSIUM: 4 mmol/L (ref 3.5–5.1)
Sodium: 138 mmol/L (ref 135–145)

## 2017-12-28 LAB — I-STAT TROPONIN, ED: Troponin i, poc: 0 ng/mL (ref 0.00–0.08)

## 2017-12-28 MED ORDER — FUROSEMIDE 40 MG PO TABS: 40.0000 mg | ORAL_TABLET | Freq: Every day | ORAL | 3 refills | Status: DC

## 2017-12-28 NOTE — ED Provider Notes (Signed)
Patient placed in Quick Look pathway, seen and evaluated   Chief Complaint: Shortness of breath, bilateral lower extremity edema  HPI:   Patient presents with ongoing and progressively worsening shortness of breath.  States that she has had thoracentesis twice before.  Has ongoing left-sided chest pains around her thoracentesis site.  Also notes bilateral lower extremity edema which has been intermittent but significantly worsened recently.  She is a poor historian and cannot tell me definitively how long her symptoms have been going on for.  Denies fevers or chills.  She notes dyspnea on exertion but denies orthopnea.  ROS: Positive for chest pain, shortness of breath, leg swelling Negative for fevers, chills  Physical Exam:   Gen: No distress  Neuro: Awake and Alert  Skin: Warm    Focused Exam: Equal rise and fall of chest, no increased work of breathing noted.  She is mildly tachycardic, no murmurs rubs or gallops noted.  2+ radial and DP/PT pulses bilaterally, there is 2+ pitting edema of the bilateral lower extremities, Homans sign absent.   Initiation of care has begun. The patient has been counseled on the process, plan, and necessity for staying for the completion/evaluation, and the remainder of the medical screening examination\   Renita Papa, PA-C 12/28/17 1712    Margette Fast, MD 12/29/17 1102

## 2017-12-28 NOTE — Telephone Encounter (Signed)
Called and left the patient a message to call the office back. Ask the patient to call and let us know if she is interest in the appt with Dr. Alvy Bimler.

## 2017-12-28 NOTE — ED Triage Notes (Signed)
Pt endorses bilateral foot swelling, abd swelling and "I have fluid around my lungs" Endorses some intermittent shob. Recently had fluid removed from left lung. Denies hx of CHF. Tachy.

## 2017-12-28 NOTE — ED Provider Notes (Signed)
[place] EMERGENCY DEPARTMENT Provider Note   CSN: [id number] Arrival date & time: 12/28/17  1613     History   Chief Complaint Chief Complaint  Patient presents with  . fluid retention  . Shortness of Breath    HPI Tracy Flynn is a 62 y.o. female.  Patient sent in from the wellness clinic for concerns of borderline oxygen level and bilateral leg swelling and abdominal swelling.  Patient was stating that she had fluid on her lungs.  She endorses intermittent shortness of breath no real history of congestive heart failure and was not having any significant chest pain.  Patient's heart rate was in the low 100s.  Oxygen saturations upon arrival with [place] was like 99%.  Chart review showed that patient was admitted June 7 to [facility] long hospital with evidence of a pleural effusion ascites and what was felt to be an ovarian mass.  During that hospitalization patient had the pleural effusion removed was consulted on by GYN oncology Dr. Gerarda Fraction.  Their evaluation felt that it was uterine fibroids for the mass there was thickening of the uterus itself and they felt that the uterus was probably a primary cause for cancer because there was evidence of metastatic disease in the liver.  Patient was discharged and was to follow-up for additional outpatient work-up which included a D&C they have called multiple times patient has been reluctant to follow-up she is been working over in her head and basically how she wants to approach this disease process.  Sounds as if she has had symptoms since the fall.  And just recently brought it to medical attention.  Probably due to the pleural effusion making her feel short of breath.  Also has an outpatient on June 21 patient had thoracentesis again to drain the recurrent pleural effusion this was done by interventional radiology.  As stated above patient had follow-up with her primary care doctor today their notes read.  Some concerns for worsening  or reaccumulation of everything and some concerns because patient not following up with GYN oncology.     Past Medical History:  Diagnosis Date  . Uterine fibroid     Patient Active Problem List   Diagnosis Date Noted  . Cyst of ovary   . 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 Mesenteric lymphadenopathy 11/27/2017  . Liver mass 11/27/2017  . Sinus tachycardia 11/27/2017  . Pleural effusion on left 11/27/2017  . Pleural effusion 11/27/2017  . Anemia 11/27/2017  . Thrombocytosis ([place]) 11/27/2017    Past Surgical History:  Procedure Laterality Date  . HERNIA REPAIR    . IR THORACENTESIS ASP PLEURAL SPACE W/IMG GUIDE  12/11/2017     OB History   None      Home Medications    Prior to Admission medications   Medication Sig Start Date End Date Taking? Authorizing Provider  cetirizine (ZYRTEC) 10 MG tablet Take 1 tablet (10 mg total) by mouth daily. 12/10/17  Yes Ena Dawley, Tiffany S, PA-C  furosemide (LASIX) 40 MG tablet Take 1 tablet (40 mg total) by mouth daily. 12/28/17 01/27/18 Yes Gildardo Pounds, NP  ibuprofen (ADVIL,MOTRIN) 200 MG tablet Take 200 mg by mouth daily as needed (PAIN).   Yes [provider]  ipratropium-albuterol (DUONEB) 0.5-2.5 (3) MG/3ML SOLN Take 3 mLs by nebulization every 6 (six) hours as needed. 11/28/17  Yes Sheikh, Omair Latif, DO  azithromycin (ZITHROMAX) 250 MG tablet Take 2 tabs (500 mg) on day 1 and 1 tab (250 mg)  on days 2-5 Patient not taking: Reported on 12/28/2017 12/11/17   Charlott Rakes, MD    Family History Family History  Problem Relation Age of Onset  . Cancer Father     Social History Social History   Tobacco Use  . Smoking status: Former Research scientist (life sciences)  . Smokeless tobacco: Never Used  Substance Use Topics  . Alcohol use: Never    Frequency: Never  . Drug use: Never     Allergies   Codeine and Penicillins   Review of Systems Review of Systems  Constitutional: Positive for fatigue and unexpected weight change.  HENT: Negative for  congestion.   Eyes: Negative for visual disturbance.  Respiratory: Positive for shortness of breath.   Cardiovascular: Negative for chest pain.  Gastrointestinal: Positive for abdominal distention. Negative for vomiting.  Genitourinary: Negative for dysuria.  Musculoskeletal: Negative for back pain.  Skin: Negative for rash.  Neurological: Negative for syncope.  Hematological: Does not bruise/bleed easily.  Psychiatric/Behavioral: Negative for confusion.     Physical Exam Updated Vital Signs BP 126/89   Pulse (!) 109   Temp 98.8 F (37.1 C) (Oral)   Resp (!) 23   Ht 1.803 m (5\' 11" )   Wt 70.3 kg (155 lb)   SpO2 100%   BMI 21.62 kg/m   Physical Exam  Constitutional: She is oriented to person, place, and time. She appears well-developed and well-nourished. No distress.  HENT:  Head: Normocephalic and atraumatic.  Mouth/Throat: Oropharynx is clear and moist.  Eyes: Pupils are equal, round, and reactive to light. Conjunctivae and EOM are normal.  Neck: Normal range of motion. Neck supple.  Cardiovascular: Regular rhythm.  Slightly tachycardic  Pulmonary/Chest: Effort normal and breath sounds normal.  Decreased breath sounds on the left.  Abdominal: Soft. Bowel sounds are normal. She exhibits distension. There is no tenderness.  Musculoskeletal: She exhibits edema.  Bilateral lower extremity edema.  Neurological: She is alert and oriented to person, place, and time. No cranial nerve deficit or sensory deficit. She exhibits normal muscle tone. Coordination normal.  Skin: Skin is warm.  Nursing note and vitals reviewed.    ED Treatments / Results  Labs (all labs ordered are listed, but only abnormal results are displayed) Labs Reviewed  BASIC METABOLIC PANEL - Abnormal; Notable for the following components:      Result Value   Calcium 8.8 (*)    All other components within normal limits  CBC - Abnormal; Notable for the following components:   Hemoglobin 8.2 (*)     HCT 28.8 (*)    MCV 72.5 (*)    MCH 20.7 (*)    MCHC 28.5 (*)    RDW 19.4 (*)    Platelets 746 (*)    All other components within normal limits  BRAIN NATRIURETIC PEPTIDE  I-STAT TROPONIN, ED    EKG EKG Interpretation  Date/Time:  Monday December 28 2017 16:56:55 EDT Ventricular Rate:  109 PR Interval:  156 QRS Duration: 72 QT Interval:  348 QTC Calculation: 468 R Axis:   16 Text Interpretation:  Sinus tachycardia Otherwise normal ECG Confirmed by Fredia Sorrow [phone]) on 12/28/2017 8:51:45 PM   Radiology Dg Chest 2 View  Result Date: 12/28/2017 CLINICAL DATA:  Shortness of breath, LEFT chest pain and cough. EXAM: CHEST - 2 VIEW COMPARISON:  Chest radiograph December 11, 2017 FINDINGS: Increased large LEFT pleural effusion with mediastinal shift to the RIGHT. Dense underlying consolidation. RIGHT lung is clear. The heart borders obscured on LEFT. No  pneumothorax. Soft tissue planes and included osseous structures are non suspicious. Staple projecting LEFT neck is likely superficial to patient. IMPRESSION: Re-accumulation of large LEFT pleural effusion with mediastinal shift to the RIGHT. Underlying consolidation or mass is possible. Acute findings discussed with and reconfirmed by Dr.JOSHUA LONG on 12/28/2017 at 5:43 pm. Electronically Signed   By: Elon Alas M.D.   On: 12/28/2017 17:43    Procedures Procedures (including critical care time)  Medications Ordered in ED Medications - No data to display   Initial Impression / Assessment and Plan / ED Course  I have reviewed the triage vital signs and the nursing notes.  Pertinent labs & imaging results that were available during my care of the patient were reviewed by me and considered in my medical decision making (see chart for details).    Labs today without significant changes from where she is been.  Patient's oxygen very appropriate in the upper 90s at rest.  Mild tachycardia heart rate in the low 100s.  No significant  arrhythmia.  Patient with decreased breath sounds on the left chest x-ray shows reaccumulation of the left pleural effusion.  Abdominal distention probably secondary to ascites patient states that it is not significantly worse it is a little bigger but not sniffily worse than when she was admitted to the hospital in the beginning of June.  Patient's desire was to have the pleural effusion drained again because that makes her feel more comfortable.  Was last changed on June 21.  I had an extensive conversation with her and her son.  It became apparent that she is not certain what treatment course she wants to take if any at all.  Various options laid out to her to include if she does not want treatment hospice care but highly recommended that she at least follow-up with GYN oncology so that they can do the [facility] find out what the cell type situation is and then they can lay out the pros and cons of each treatment plan or no treatment at all so that the patient can make an informed decision.  Patient seemed to be willing to do that.  With vital signs being where they are in labs being where they also no reason for an acute admission to the hospital today.  Patient's had long-standing lower extremity edema.  Obviously she is a set up for pulmonary embolus but is really no change in her heart rate or respiratory status.  Did contact interventional radiology left voice message for patient to be contacted to have an outpatient thoracentesis of the pleural fluid.  Patient seemed to be willing to follow-up with GYN oncology she had all the contact information but was provided again felt that she would do it sometime in the next 7 days.  Patient had driven herself to her outpatient visit and was sent over to the emergency department so she does have transportation home.  Based on the prior visits the patient's symptoms are almost all related to metastatic uterine cancer.  However this is not been confirmed  completely.   Final Clinical Impressions(s) / ED Diagnoses   Final diagnoses:  Pleural effusion on left    ED Discharge Orders    None       Fredia Sorrow, MD 12/30/17 [id number]

## 2017-12-28 NOTE — Patient Instructions (Signed)
Dr. Juliene Pina [facility] [place], [place], [place] [address] [phone]

## 2017-12-28 NOTE — Discharge Instructions (Addendum)
Interventional radiology should be calling you in the morning to remove the fluid from the left lung.  Dr. Gerarda Fraction information the GYN oncologist is provided above.  Would recommend that you at least go and see her and discuss your options and what their plan is.  And then decide what you want to do.  Return for any new or worse symptoms.

## 2017-12-28 NOTE — Progress Notes (Addendum)
Assessment & Plan:  Tracy Flynn was seen today for establish care and flank pain.  Diagnoses and all orders for this visit:  Ovarian tumor She was advised of her upcoming appointment with Dr. Hulan Fray. She is very reluctant to go to her appointment. She appears to be somewhat in denial regarding the possible severity of her diagnosis. We had a long discussion and I advised her to go to her appointment and sit down to discuss her options with Dr. Hulan Fray in regards to how she would like to proceed or not proceed with additional work up and treatment.   Pleural effusion on left Patient was advised to go the Emergency room due to tachycardia, below average O2 sats, increased shortness of breath and increased abdominal distension as well as BLE edema unresponsive to po lasix.  -     furosemide (LASIX) 40 MG tablet; Take 1 tablet (40 mg total) by mouth daily. Increased from 20mg  daily.     Patient has been counseled on age-appropriate routine health concerns for screening and prevention. These are reviewed and up-to-date. Referrals have been placed accordingly. Immunizations are up-to-date or declined.    Subjective:   Chief Complaint  Patient presents with  . Establish Care    Pt. is here to establish care. Pt. stated she bad swelling on both of her feet.   . Flank Pain    Pt. stated when she got her lung drain at the ED, she is still having left side pain from it.   HPI Tracy Flynn 62 y.o. female presents to office today to establish care.  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 Her PMH is unremarkable prior to her recent hospitalization last month. She is still adamant today that she will not have surgery, radiation or chemotherapy. She has an appointment with GYN on 12-31-2017 which I have advised her to keep. She still has not made a f/u appt with GYN/ONC since her hospital  admission despite being advised to do so. She is self pay at this time and I have advised her to apply for the financial assistance program.  Please see A&P above for additional notes.   Review of Systems  Constitutional: Positive for malaise/fatigue. Negative for fever and weight loss.  HENT: Negative.  Negative for nosebleeds.   Eyes: Negative.  Negative for blurred vision, double vision and photophobia.  Respiratory: Positive for shortness of breath. Negative for cough.   Cardiovascular: Positive for chest pain (left sided thoracic pain), palpitations and leg swelling.  Gastrointestinal: Positive for abdominal pain (tenderness and distension). Negative for heartburn, nausea and vomiting.  Musculoskeletal: Negative.  Negative for myalgias.  Neurological: Negative.  Negative for dizziness, focal weakness, seizures and headaches.  Psychiatric/Behavioral: Negative.  Negative for suicidal ideas.    Past Medical History:  Diagnosis Date  . Uterine fibroid     Past Surgical History:  Procedure Laterality Date  . HERNIA REPAIR    . IR THORACENTESIS ASP PLEURAL SPACE W/IMG GUIDE  12/11/2017    Family History  Problem Relation Age of Onset  . Cancer Father     Social History Reviewed with no changes to be made today.   Outpatient Medications Prior to Visit  Medication Sig Dispense Refill  . azithromycin (ZITHROMAX) 250 MG tablet Take 2 tabs (500 mg) on day 1 and 1 tab (250 mg) on days 2-5 6 tablet 0  . cetirizine (ZYRTEC)  10 MG tablet Take 1 tablet (10 mg total) by mouth daily. 30 tablet 11  . ibuprofen (ADVIL,MOTRIN) 200 MG tablet Take 200 mg by mouth daily as needed (PAIN).    Marland Kitchen ipratropium-albuterol (DUONEB) 0.5-2.5 (3) MG/3ML SOLN Take 3 mLs by nebulization every 6 (six) hours as needed. 360 mL 0  . furosemide (LASIX) 20 MG tablet Take 1 tablet (20 mg total) by mouth daily. 30 tablet 3   No facility-administered medications prior to visit.     Allergies  Allergen Reactions    . Codeine   . Penicillins     Has patient had a PCN reaction causing immediate rash, facial/tongue/throat swelling, SOB or lightheadedness with hypotension: {Yes Has patient had a PCN reaction causing severe rash involving mucus membranes or skin necrosis:No Has patient had a PCN reaction that required hospitalization: No Has patient had a PCN reaction occurring within the last 10 years: No If all of the above answers are "NO", then may proceed with Cephalosporin use.        Objective:    BP 126/83 (BP Location: Left Arm, Patient Position: Sitting, Cuff Size: Normal)   Pulse (!) 111   Temp 98.8 F (37.1 C) (Oral)   Resp 14   Ht 5\' 11"  (1.803 m)   Wt 155 lb 9.6 oz (70.6 kg)   SpO2 94%   BMI 21.70 kg/m  Wt Readings from Last 3 Encounters:  12/28/17 155 lb (70.3 kg)  12/28/17 155 lb 9.6 oz (70.6 kg)  12/10/17 143 lb 6.4 oz (65 kg)    Physical Exam  Constitutional: She is oriented to person, place, and time. She appears well-developed and well-nourished. She is cooperative.  HENT:  Head: Normocephalic and atraumatic.  Eyes: EOM are normal.  Neck: Normal range of motion.  Cardiovascular: Regular rhythm and normal heart sounds. Tachycardia present. Exam reveals no gallop and no friction rub.  No murmur heard. Pulmonary/Chest: Effort normal and breath sounds normal. No accessory muscle usage or stridor. Tachypnea noted. No respiratory distress. She has no wheezes. She has no rhonchi. She has no rales. She exhibits no tenderness.  Abdominal: Bowel sounds are normal. She exhibits distension. There is generalized tenderness.  Musculoskeletal: Normal range of motion. She exhibits edema (BLE pitting edema).  Neurological: She is alert and oriented to person, place, and time. Coordination normal.  Skin: Skin is warm and dry.  Psychiatric: Her speech is normal and behavior is normal. Judgment and thought content normal. Her affect is blunt. Cognition and memory are normal.  Nursing  note and vitals reviewed.      Patient has been counseled extensively about nutrition and exercise as well as the importance of adherence with medications and regular follow-up. The patient was given clear instructions to go to ER or return to medical center if symptoms don't improve, worsen or new problems develop. The patient verbalized understanding.   Follow-up: Return for after ED visit.   Gildardo Pounds, FNP-BC [facility] and [place], [place]   12/28/2017, 6:06 PM

## 2017-12-29 ENCOUNTER — Other Ambulatory Visit (HOSPITAL_COMMUNITY): Payer: Self-pay | Admitting: Physician Assistant

## 2017-12-29 ENCOUNTER — Ambulatory Visit (HOSPITAL_COMMUNITY)
Admission: RE | Admit: 2017-12-29 | Discharge: 2017-12-29 | Disposition: A | Discharge status: Home / Self Care | Payer: Self-pay | Source: Ambulatory Visit | Attending: Physician Assistant | Admitting: Physician Assistant

## 2017-12-29 ENCOUNTER — Encounter (HOSPITAL_COMMUNITY): Payer: Self-pay | Admitting: Physician Assistant

## 2017-12-29 DIAGNOSIS — J90 Pleural effusion, not elsewhere classified: Secondary | ICD-10-CM

## 2017-12-29 DIAGNOSIS — K769 Liver disease, unspecified: Secondary | ICD-10-CM | POA: Insufficient documentation

## 2017-12-29 HISTORY — PX: IR THORACENTESIS ASP PLEURAL SPACE W/IMG GUIDE: IMG5380

## 2017-12-29 MED ORDER — LIDOCAINE HCL 2 % IJ SOLN
INTRAMUSCULAR | Status: AC | PRN
  Administered 2017-12-29: 10 mL

## 2017-12-29 MED ORDER — LIDOCAINE HCL (PF) 2 % IJ SOLN
INTRAMUSCULAR | Status: AC
  Filled 2017-12-29: qty 10

## 2017-12-29 NOTE — Procedures (Signed)
PROCEDURE SUMMARY:  Successful US guided left thoracentesis. Yielded 2.3 liters of bloody fluid. Patient tolerated procedure well. No immediate complications.  Post procedure chest X-ray reveals no pneumothorax  Shamila Lerch S Moua Rasmusson PA-C 12/29/2017 12:59 PM

## 2017-12-31 ENCOUNTER — Encounter: Payer: Self-pay | Admitting: Obstetrics & Gynecology

## 2018-01-01 ENCOUNTER — Telehealth: Payer: Self-pay | Admitting: *Deleted

## 2018-01-01 NOTE — Telephone Encounter (Signed)
Called and left the patient a message to call the office back.  

## 2018-01-05 ENCOUNTER — Telehealth: Payer: Self-pay | Admitting: *Deleted

## 2018-01-05 NOTE — Telephone Encounter (Signed)
Called and left the patient a message to call the office. Patient needs to be scheduled for a new patient appt. Ask the patient to call the office to let us know if she would like the appt or not.

## 2018-01-06 ENCOUNTER — Telehealth: Payer: Self-pay | Admitting: Oncology

## 2018-01-06 NOTE — Telephone Encounter (Signed)
Left a message on patient's voicemail regarding setting up an appointment.  Requested a return call.  Also attempted to call patient's son but was unable to leave a message.  His voicemail was full and not accepting messages.

## 2018-01-12 ENCOUNTER — Telehealth: Payer: Self-pay | Admitting: Nurse Practitioner

## 2018-01-12 ENCOUNTER — Other Ambulatory Visit: Payer: Self-pay

## 2018-01-12 ENCOUNTER — Inpatient Hospital Stay (HOSPITAL_BASED_OUTPATIENT_CLINIC_OR_DEPARTMENT_OTHER)
Admission: EM | Admit: 2018-01-12 | Discharge: 2018-01-16 | DRG: 754 | Disposition: A | Discharge status: Home / Self Care | Payer: Self-pay | Attending: Internal Medicine | Admitting: Internal Medicine

## 2018-01-12 ENCOUNTER — Emergency Department (HOSPITAL_BASED_OUTPATIENT_CLINIC_OR_DEPARTMENT_OTHER): Payer: Self-pay

## 2018-01-12 ENCOUNTER — Encounter (HOSPITAL_BASED_OUTPATIENT_CLINIC_OR_DEPARTMENT_OTHER): Payer: Self-pay

## 2018-01-12 DIAGNOSIS — C561 Malignant neoplasm of right ovary: Principal | ICD-10-CM | POA: Diagnosis present

## 2018-01-12 DIAGNOSIS — Z7189 Other specified counseling: Secondary | ICD-10-CM

## 2018-01-12 DIAGNOSIS — R601 Generalized edema: Secondary | ICD-10-CM

## 2018-01-12 DIAGNOSIS — J910 Malignant pleural effusion: Secondary | ICD-10-CM | POA: Diagnosis present

## 2018-01-12 DIAGNOSIS — E43 Unspecified severe protein-calorie malnutrition: Secondary | ICD-10-CM | POA: Diagnosis present

## 2018-01-12 DIAGNOSIS — Z515 Encounter for palliative care: Secondary | ICD-10-CM

## 2018-01-12 DIAGNOSIS — Z87891 Personal history of nicotine dependence: Secondary | ICD-10-CM

## 2018-01-12 DIAGNOSIS — E876 Hypokalemia: Secondary | ICD-10-CM | POA: Diagnosis present

## 2018-01-12 DIAGNOSIS — D649 Anemia, unspecified: Secondary | ICD-10-CM

## 2018-01-12 DIAGNOSIS — Z809 Family history of malignant neoplasm, unspecified: Secondary | ICD-10-CM

## 2018-01-12 DIAGNOSIS — R140 Abdominal distension (gaseous): Secondary | ICD-10-CM

## 2018-01-12 DIAGNOSIS — R0602 Shortness of breath: Secondary | ICD-10-CM

## 2018-01-12 DIAGNOSIS — J90 Pleural effusion, not elsewhere classified: Secondary | ICD-10-CM

## 2018-01-12 DIAGNOSIS — C787 Secondary malignant neoplasm of liver and intrahepatic bile duct: Secondary | ICD-10-CM | POA: Diagnosis present

## 2018-01-12 DIAGNOSIS — C799 Secondary malignant neoplasm of unspecified site: Secondary | ICD-10-CM

## 2018-01-12 DIAGNOSIS — J9601 Acute respiratory failure with hypoxia: Secondary | ICD-10-CM | POA: Diagnosis present

## 2018-01-12 DIAGNOSIS — R180 Malignant ascites: Secondary | ICD-10-CM | POA: Diagnosis present

## 2018-01-12 DIAGNOSIS — E538 Deficiency of other specified B group vitamins: Secondary | ICD-10-CM | POA: Diagnosis present

## 2018-01-12 DIAGNOSIS — Z6822 Body mass index (BMI) 22.0-22.9, adult: Secondary | ICD-10-CM

## 2018-01-12 DIAGNOSIS — D509 Iron deficiency anemia, unspecified: Secondary | ICD-10-CM | POA: Diagnosis present

## 2018-01-12 HISTORY — DX: Pleural effusion, not elsewhere classified: J90

## 2018-01-12 LAB — COMPREHENSIVE METABOLIC PANEL
ALT: 10 U/L (ref 0–44)
AST: 17 U/L (ref 15–41)
Albumin: 1.9 g/dL — ABNORMAL LOW (ref 3.5–5.0)
Alkaline Phosphatase: 95 U/L (ref 38–126)
Anion gap: 9 (ref 5–15)
BUN: 20 mg/dL (ref 8–23)
CO2: 26 mmol/L (ref 22–32)
Calcium: 8.6 mg/dL — ABNORMAL LOW (ref 8.9–10.3)
Chloride: 104 mmol/L (ref 98–111)
Creatinine, Ser: 0.77 mg/dL (ref 0.44–1.00)
GFR calc Af Amer: >60 mL/min (ref >60)
GFR calc non Af Amer: >60 mL/min (ref >60)
Glucose, Bld: 80 mg/dL (ref 70–99)
Potassium: 4.2 mmol/L (ref 3.5–5.1)
Sodium: 139 mmol/L (ref 135–145)
Total Bilirubin: 0.4 mg/dL (ref 0.3–1.2)
Total Protein: 8.3 g/dL — ABNORMAL HIGH (ref 6.5–8.1)

## 2018-01-12 LAB — CBC WITH DIFFERENTIAL/PLATELET
Basophils Absolute: 0 10*3/uL (ref 0.0–0.1)
Basophils Relative: 0 %
Eosinophils Absolute: 0 10*3/uL (ref 0.0–0.7)
Eosinophils Relative: 0 %
HCT: 27.9 % — ABNORMAL LOW (ref 36.0–46.0)
Hemoglobin: 8.6 g/dL — ABNORMAL LOW (ref 12.0–15.0)
Lymphocytes Relative: 4 %
Lymphs Abs: 0.5 10*3/uL — ABNORMAL LOW (ref 0.7–4.0)
MCH: 21.5 pg — ABNORMAL LOW (ref 26.0–34.0)
MCHC: 30.8 g/dL (ref 30.0–36.0)
MCV: 69.8 fL — ABNORMAL LOW (ref 78.0–100.0)
Monocytes Absolute: 1.2 10*3/uL — ABNORMAL HIGH (ref 0.1–1.0)
Monocytes Relative: 10 %
Neutro Abs: 9.8 10*3/uL — ABNORMAL HIGH (ref 1.7–7.7)
Neutrophils Relative %: 86 %
Platelets: 695 10*3/uL — ABNORMAL HIGH (ref 150–400)
RBC: 4 MIL/uL (ref 3.87–5.11)
RDW: 19.2 % — ABNORMAL HIGH (ref 11.5–15.5)
WBC: 11.5 10*3/uL — ABNORMAL HIGH (ref 4.0–10.5)

## 2018-01-12 LAB — RETICULOCYTES
RBC.: 4.08 MIL/uL (ref 3.87–5.11)
Retic Count, Absolute: 36.7 10*3/uL (ref 19.0–186.0)
Retic Ct Pct: 0.9 % (ref 0.4–3.1)

## 2018-01-12 LAB — BRAIN NATRIURETIC PEPTIDE: B Natriuretic Peptide: 67.8 pg/mL (ref 0.0–100.0)

## 2018-01-12 LAB — MAGNESIUM: Magnesium: 2.1 mg/dL (ref 1.7–2.4)

## 2018-01-12 LAB — TROPONIN I
Troponin I: 0.08 ng/mL (ref <0.03)
Troponin I: 0.07 ng/mL (ref <0.03)

## 2018-01-12 MED ORDER — ONDANSETRON HCL 4 MG PO TABS: 4.0000 mg | ORAL_TABLET | Freq: Four times a day (QID) | ORAL | Status: DC | PRN

## 2018-01-12 MED ORDER — ONDANSETRON HCL 4 MG/2ML IJ SOLN: 4.0000 mg | Freq: Four times a day (QID) | INTRAMUSCULAR | Status: DC | PRN

## 2018-01-12 MED ORDER — FUROSEMIDE 10 MG/ML IJ SOLN: 20.0000 mg | Freq: Two times a day (BID) | INTRAMUSCULAR | Status: DC

## 2018-01-12 MED ORDER — FUROSEMIDE 10 MG/ML IJ SOLN
20.0000 mg | Freq: Two times a day (BID) | INTRAMUSCULAR | Status: DC
  Administered 2018-01-13 – 2018-01-16 (×7): 20 mg via INTRAVENOUS
  Filled 2018-01-12 (×7): qty 2

## 2018-01-12 MED ORDER — IPRATROPIUM-ALBUTEROL 0.5-2.5 (3) MG/3ML IN SOLN: 3.0000 mL | Freq: Four times a day (QID) | RESPIRATORY_TRACT | Status: DC | PRN

## 2018-01-12 MED ORDER — ACETAMINOPHEN 325 MG PO TABS: 650.0000 mg | ORAL_TABLET | Freq: Four times a day (QID) | ORAL | Status: DC | PRN

## 2018-01-12 MED ORDER — POTASSIUM CHLORIDE CRYS ER 10 MEQ PO TBCR
10.0000 meq | EXTENDED_RELEASE_TABLET | Freq: Every day | ORAL | Status: DC
  Administered 2018-01-13 – 2018-01-14 (×2): 10 meq via ORAL
  Filled 2018-01-12 (×2): qty 1

## 2018-01-12 MED ORDER — ACETAMINOPHEN 650 MG RE SUPP: 650.0000 mg | Freq: Four times a day (QID) | RECTAL | Status: DC | PRN

## 2018-01-12 NOTE — ED Notes (Signed)
[facility] arrived to transport pt 

## 2018-01-12 NOTE — ED Notes (Signed)
Pt sitting up on edge of bed. NAD noted. Awaiting transport. Family at bedside.

## 2018-01-12 NOTE — ED Triage Notes (Signed)
Pt c/o SOB-states "feels like I got fluid in my lungs"-NAD-to triage in w/c

## 2018-01-12 NOTE — Telephone Encounter (Signed)
Patients son called and requested for a call back. Please fu at your earliest convenience.

## 2018-01-12 NOTE — ED Notes (Signed)
Report given to JJ with [facility].  

## 2018-01-12 NOTE — ED Notes (Signed)
ED Provider at bedside. 

## 2018-01-12 NOTE — ED Notes (Signed)
O2 2L [place] applied by [place], RT.

## 2018-01-12 NOTE — ED Provider Notes (Signed)
[facility] EMERGENCY DEPARTMENT Provider Note   CSN: [id number] Arrival date & time: 01/12/18  [place]     History   Chief Complaint Chief Complaint  Patient presents with  . Shortness of Breath    HPI Tracy Flynn is a 62 y.o. female with history of pleural effusion who presents with shortness of breath that has returned.  Patient has had ongoing lower extremity edema and abdominal distention.  Per chart review, patient has potentially metastatic uterine cancer and has not wanted to follow-up with gynecology oncology.  Several attempts have been made by the office and patient has not responded.  She has had thoracentesis in the past.  She denies any chest pain, abdominal pain, fevers, nausea, vomiting, urinary symptoms.  HPI  Past Medical History:  Diagnosis Date  . Pleural effusion   . Uterine fibroid     Patient Active Problem List   Diagnosis Date Noted  . Cyst of ovary   . 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 Mesenteric lymphadenopathy 11/27/2017  . Liver mass 11/27/2017  . Sinus tachycardia 11/27/2017  . Pleural effusion on left 11/27/2017  . Pleural effusion 11/27/2017  . Anemia 11/27/2017  . Thrombocytosis ([place]) 11/27/2017    Past Surgical History:  Procedure Laterality Date  . HERNIA REPAIR    . IR THORACENTESIS ASP PLEURAL SPACE W/IMG GUIDE  12/11/2017  . IR THORACENTESIS ASP PLEURAL SPACE W/IMG GUIDE  12/29/2017     OB History   None      Home Medications    Prior to Admission medications   Medication Sig Start Date End Date Taking? Authorizing Provider  azithromycin (ZITHROMAX) 250 MG tablet Take 2 tabs (500 mg) on day 1 and 1 tab (250 mg) on days 2-5 Patient not taking: Reported on 12/28/2017 12/11/17   Charlott Rakes, MD  cetirizine (ZYRTEC) 10 MG tablet Take 1 tablet (10 mg total) by mouth daily. 12/10/17   Brayton Caves, PA-C  furosemide (LASIX) 40 MG tablet Take 1 tablet (40 mg total) by mouth daily. 12/28/17 01/27/18  Gildardo Pounds, NP    ibuprofen (ADVIL,MOTRIN) 200 MG tablet Take 200 mg by mouth daily as needed (PAIN).    [provider]  ipratropium-albuterol (DUONEB) 0.5-2.5 (3) MG/3ML SOLN Take 3 mLs by nebulization every 6 (six) hours as needed. 11/28/17   Kerney Elbe, DO    Family History Family History  Problem Relation Age of Onset  . Cancer Father     Social History Social History   Tobacco Use  . Smoking status: Former Research scientist (life sciences)  . Smokeless tobacco: Never Used  Substance Use Topics  . Alcohol use: Never    Frequency: Never  . Drug use: Never     Allergies   Codeine and Penicillins   Review of Systems Review of Systems  Constitutional: Negative for chills and fever.  HENT: Negative for facial swelling and sore throat.   Respiratory: Positive for shortness of breath.   Cardiovascular: Positive for leg swelling. Negative for chest pain.  Gastrointestinal: Positive for abdominal distention. Negative for abdominal pain, nausea and vomiting.  Genitourinary: Negative for dysuria.  Musculoskeletal: Negative for back pain.  Skin: Negative for rash and wound.  Neurological: Negative for headaches.  Psychiatric/Behavioral: The patient is not nervous/anxious.      Physical Exam Updated Vital Signs BP 137/87   Pulse (!) 110   Temp 98.2 F (36.8 C) (Oral)   Resp (!) 23   Ht 5\' 11"  (1.803 m)   Wt  74.4 kg (164 lb)   SpO2 100%   BMI 22.87 kg/m   Physical Exam  Constitutional: She appears well-developed and well-nourished. No distress.  HENT:  Head: Normocephalic and atraumatic.  Mouth/Throat: Oropharynx is clear and moist. No oropharyngeal exudate.  Eyes: Pupils are equal, round, and reactive to light. Conjunctivae are normal. Right eye exhibits no discharge. Left eye exhibits no discharge. No scleral icterus.  Neck: Normal range of motion. Neck supple. No thyromegaly present.  Cardiovascular: Normal rate, regular rhythm, normal heart sounds and intact distal pulses. Exam reveals  no gallop and no friction rub.  No murmur heard. Pulmonary/Chest: Effort normal. No stridor. No respiratory distress. She has decreased breath sounds (L base). She has no wheezes. She has no rales.  Abdominal: Soft. Bowel sounds are normal. She exhibits distension. There is no tenderness. There is no rebound and no guarding.  Musculoskeletal:       Right lower leg: She exhibits edema (2+). She exhibits no tenderness.       Left lower leg: She exhibits edema (2+). She exhibits no tenderness.  Lymphadenopathy:    She has no cervical adenopathy.  Neurological: She is alert. Coordination normal.  Skin: Skin is warm and dry. No rash noted. She is not diaphoretic. No pallor.  Psychiatric: She has a normal mood and affect.  Nursing note and vitals reviewed.    ED Treatments / Results  Labs (all labs ordered are listed, but only abnormal results are displayed) Labs Reviewed  COMPREHENSIVE METABOLIC PANEL - Abnormal; Notable for the following components:      Result Value   Calcium 8.6 (*)    Total Protein 8.3 (*)    Albumin 1.9 (*)    All other components within normal limits  CBC WITH DIFFERENTIAL/PLATELET - Abnormal; Notable for the following components:   WBC 11.5 (*)    Hemoglobin 8.6 (*)    HCT 27.9 (*)    MCV 69.8 (*)    MCH 21.5 (*)    RDW 19.2 (*)    Platelets 695 (*)    Neutro Abs 9.8 (*)    Lymphs Abs 0.5 (*)    Monocytes Absolute 1.2 (*)    All other components within normal limits  TROPONIN I - Abnormal; Notable for the following components:   Troponin I 0.08 (*)    All other components within normal limits  BRAIN NATRIURETIC PEPTIDE    EKG EKG Interpretation  Date/Time:  Tuesday January 12 2018 17:07:42 EDT Ventricular Rate:  115 PR Interval:    QRS Duration: 79 QT Interval:  327 QTC Calculation: 453 R Axis:   9 Text Interpretation:  Sinus tachycardia Abnormal R-wave progression, early transition No STEMI.  Confirmed by Nanda Quinton [phone]) on 01/12/2018 7:28:04  PM   Radiology Dg Chest 2 View  Result Date: 01/12/2018 CLINICAL DATA:  62 year old with shortness of breath. History of left thoracentesis. EXAM: CHEST - 2 VIEW COMPARISON:  12/29/2017 FINDINGS: Increased densities throughout the left hemithorax. Findings compatible with a very large left pleural effusion which has increased in size from the comparison examination. There is tracheal and mediastinal deviation towards the right and this has been present on previous examinations. Densities in the mid right chest may represent overlying structures. Probable small right pleural effusion. Limited evaluation of the cardiac silhouette. IMPRESSION: Increased left pleural fluid since 12/29/2017. Findings are compatible with a very large left pleural effusion. Electronically Signed   By: Markus Daft M.D.   On: 01/12/2018 18:08  Procedures Procedures (including critical care time)  Medications Ordered in ED Medications - No data to display   Initial Impression / Assessment and Plan / ED Course  I have reviewed the triage vital signs and the nursing notes.  Pertinent labs & imaging results that were available during my care of the patient were reviewed by me and considered in my medical decision making (see chart for details).     Patient with reaccumulated left pleural effusion.  Troponin 0.08.  CBC shows WBC 11.5, platelets 695K.  CMP shows protein 8.3, albumin 1.9.  BNP within normal limits.  EKG shows sinus tachycardia.  Patient tachycardic in the past.  She is in no acute distress.  Pulmonary embolism considered, however patient has similar symptoms and lab values since her last pleural effusion. Oxygen saturations above 96% on room air.  Patient does have an outpatient appointment with OB/GYN, she reports.  I discussed patient case with Dr. Shanon Brow with [facility] at [facility] for admission for thoracentesis and further workup.  I appreciate her assistance with the patient.  Patient also evaluated by Dr.  Laverta Baltimore, my attending, who guided the patient's management and agrees with plan.   Final Clinical Impressions(s) / ED Diagnoses   Final diagnoses:  Pleural effusion on left    ED Discharge Orders    None       Frederica Kuster, Hershal Coria 01/12/18 1930    Margette Fast, MD 01/13/18 1108

## 2018-01-12 NOTE — H&P (Signed)
History and Physical    Cheyeanne Roadcap IPJ:[id number] DOB: 07/07/1955 DOA: 01/12/2018  PCP: Gildardo Pounds, NP  Patient coming from: Home.  Chief Complaint: Shortness of breath.  HPI: Tracy Flynn is a 62 y.o. 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  Patient has not followed up with her gynecologist.  Has an appointment on August 26 next month.  As patient comes to [place] with complaints of worsening shortness of breath which is acutely worsened over the last 24 hours.  Denies any chest pain or productive cough fever or chills.  ED Course: In the ER x-rays showed large left pleural effusion.  Patient also has diffuse edema all over both lower extremities and abdomen.  Patient was recently placed on Lasix by primary care physician over the last 1 month which patient decreased the dose to 20 over the last 2 weeks.  Review of Systems: As per HPI, rest all negative.   Past Medical History:  Diagnosis Date  . Pleural effusion   . Uterine fibroid     Past Surgical History:  Procedure Laterality Date  . HERNIA REPAIR    . IR THORACENTESIS ASP PLEURAL SPACE W/IMG GUIDE  12/11/2017  . IR THORACENTESIS ASP PLEURAL SPACE W/IMG GUIDE  12/29/2017     reports that she has quit smoking. She has never used smokeless tobacco. She reports that she does not drink alcohol or use drugs.  Allergies  Allergen Reactions  . Codeine Other (See Comments)    Unknown  . Penicillins     Has patient had a PCN reaction causing immediate rash, facial/tongue/throat swelling, SOB or lightheadedness with hypotension: {Yes Has patient had a PCN reaction causing severe rash involving mucus membranes or skin necrosis:No Has patient had a PCN reaction that  required hospitalization: No Has patient had a PCN reaction occurring within the last 10 years: No If all of the above answers are "NO", then may proceed with Cephalosporin use.     Family History  Problem Relation Age of Onset  . Cancer Father     Prior to Admission medications   Medication Sig Start Date End Date Taking? Authorizing Provider  azithromycin (ZITHROMAX) 250 MG tablet Take 2 tabs (500 mg) on day 1 and 1 tab (250 mg) on days 2-5 Patient not taking: Reported on 12/28/2017 12/11/17   Charlott Rakes, MD  cetirizine (ZYRTEC) 10 MG tablet Take 1 tablet (10 mg total) by mouth daily. 12/10/17   Brayton Caves, PA-C  furosemide (LASIX) 40 MG tablet Take 1 tablet (40 mg total) by mouth daily. 12/28/17 01/27/18  Gildardo Pounds, NP  ibuprofen (ADVIL,MOTRIN) 200 MG tablet Take 200 mg by mouth daily as needed (PAIN).    [provider]  ipratropium-albuterol (DUONEB) 0.5-2.5 (3) MG/3ML SOLN Take 3 mLs by nebulization every 6 (six) hours as needed. 11/28/17   Kerney Elbe, DO    Physical Exam: Vitals:   01/12/18 1930 01/12/18 2000 01/12/18 2000 01/12/18 2133  BP: 126/74 116/74 126/74 130/77  Pulse: (!) 111 (!) 114 (!) 115 (!) 116  Resp: (!) 22 (!) 24 (!) 27 (!) 22  Temp:    97.8 F (36.6 C)  TempSrc:    Oral  SpO2: 100% 100% 99% 100%  Weight:      Height:          Constitutional: Moderately built and poorly nourished. Vitals:   01/12/18 1930 01/12/18 2000 01/12/18 2000 01/12/18 2133  BP: 126/74 116/74 126/74 130/77  Pulse: (!) 111 (!) 114 (!) 115 (!) 116  Resp: (!) 22 (!) 24 (!) 27 (!) 22  Temp:    97.8 F (36.6 C)  TempSrc:    Oral  SpO2: 100% 100% 99% 100%  Weight:      Height:       Eyes: Anicteric no pallor. ENMT: No discharge from the ears eyes nose or mouth. Neck: No mass felt.  No neck rigidity no JVD appreciated. Respiratory: No rhonchi or crepitations. Cardiovascular: S1-S2 heard no murmurs appreciated. Abdomen: Distended nontender bowel  sounds present. Musculoskeletal: Bilateral lower extremity edema present. Skin: No rash. Neurologic: Alert awake oriented to time place and person.  Moves all extremities. Psychiatric: Appears normal per normal affect.   Labs on Admission: I have personally reviewed following labs and imaging studies  CBC: Recent Labs  Lab 01/12/18 1722  WBC 11.5*  NEUTROABS 9.8*  HGB 8.6*  HCT 27.9*  MCV 69.8*  PLT 203*   Basic Metabolic Panel: Recent Labs  Lab 01/12/18 1722  NA 139  K 4.2  CL 104  CO2 26  GLUCOSE 80  BUN 20  CREATININE 0.77  CALCIUM 8.6*   GFR: Estimated Creatinine Clearance: 81.5 mL/min (by C-G formula based on SCr of 0.77 mg/dL). Liver Function Tests: Recent Labs  Lab 01/12/18 1722  AST 17  ALT 10  ALKPHOS 95  BILITOT 0.4  PROT 8.3*  ALBUMIN 1.9*   No results for input(s): LIPASE, AMYLASE in the last 168 hours. No results for input(s): AMMONIA in the last 168 hours. Coagulation Profile: No results for input(s): INR, PROTIME in the last 168 hours. Cardiac Enzymes: Recent Labs  Lab 01/12/18 1722  TROPONINI 0.08*   BNP (last 3 results) No results for input(s): PROBNP in the last 8760 hours. HbA1C: No results for input(s): HGBA1C in the last 72 hours. CBG: No results for input(s): GLUCAP in the last 168 hours. Lipid Profile: No results for input(s): CHOL, HDL, LDLCALC, TRIG, CHOLHDL, LDLDIRECT in the last 72 hours. Thyroid Function Tests: No results for input(s): TSH, T4TOTAL, FREET4, T3FREE, THYROIDAB in the last 72 hours. Anemia Panel: No results for input(s): VITAMINB12, FOLATE, FERRITIN, TIBC, IRON, RETICCTPCT in the last 72 hours. Urine analysis:    Component Value Date/Time   COLORURINE YELLOW 11/26/2017 1906   APPEARANCEUR CLEAR 11/26/2017 1906   LABSPEC 1.020 11/26/2017 1906   PHURINE 5.5 11/26/2017 1906   GLUCOSEU NEGATIVE 11/26/2017 1906   HGBUR MODERATE (A) 11/26/2017 [place] NEGATIVE 11/26/2017 1906   KETONESUR 15  (A) 11/26/2017 1906   PROTEINUR NEGATIVE 11/26/2017 1906   NITRITE NEGATIVE 11/26/2017 1906   LEUKOCYTESUR NEGATIVE 11/26/2017 1906   Sepsis Labs: @LABRCNTIP (procalcitonin:4,lacticidven:4) )No results found for this or any previous visit (from the past 240 hour(s)).   Radiological Exams on Admission: Dg Chest 2 View  Result Date: 01/12/2018 CLINICAL DATA:  62 year old with shortness of breath. History of left thoracentesis. EXAM: CHEST - 2 VIEW COMPARISON:  12/29/2017 FINDINGS: Increased densities throughout the left hemithorax. Findings compatible with a very large left pleural effusion which has increased in size from the comparison examination. There is tracheal and mediastinal deviation towards the right and this has been present on previous examinations. Densities in the mid right chest may represent  overlying structures. Probable small right pleural effusion. Limited evaluation of the cardiac silhouette. IMPRESSION: Increased left pleural fluid since 12/29/2017. Findings are compatible with a very large left pleural effusion. Electronically Signed   By: Markus Daft M.D.   On: 01/12/2018 18:08      Assessment/Plan Principal Problem:   Pleural effusion on left Active Problems:   Anasarca   Severe protein-calorie malnutrition (HCC)   Normochromic normocytic anemia    1. Acute respiratory failure with hypoxia likely from left pleural effusion likely malignant -we will get ultrasound-guided thoracentesis in the morning. 2. Anasarca likely from severe protein calorie malnutrition with the possibility of ovarian neoplasm concerning for malignancy -on Lasix will doses IV for now.  Closely monitor respiratory status and renal function.  Since patient's troponin is mildly elevated will check 2D echo.  May try to obtain OB/GYN consult while inpatient at this time if possible. 3. Anemia, normocytic normochromic.  Follow CBC with next blood draw check anemia panel.   DVT prophylaxis:  SCDs. Code Status: Full code. Family Communication: Patient's son over the phone. Disposition Plan: To be determined. Consults called: None. Admission status: Observation.   Rise Patience MD [facility] Pager [phone].  If 7PM-7AM, please contact night-coverage [email or web address] Password [facility]  01/12/2018, 10:33 PM

## 2018-01-12 NOTE — Telephone Encounter (Signed)
CMA spoke to the patient. Patient stated she think she may need fluid remove out of her lungs again. CMA advised patient to go to Emergency room to have her lung check out and have the fluid remove.  Patient understood.

## 2018-01-12 NOTE — ED Triage Notes (Signed)
Nurse first note: pt c/o "fluid in my lungs" and sob, o2 sat is 99%, hr 118 at nurse first.

## 2018-01-12 NOTE — ED Notes (Signed)
Contacted [facility] for transport [place])

## 2018-01-12 NOTE — Plan of Care (Signed)
Discussed with patient plan of care for the evening, pain management and hospital routine with some teach back displayed

## 2018-01-13 ENCOUNTER — Observation Stay (HOSPITAL_COMMUNITY): Payer: Self-pay

## 2018-01-13 ENCOUNTER — Inpatient Hospital Stay (HOSPITAL_COMMUNITY): Payer: Self-pay

## 2018-01-13 ENCOUNTER — Encounter (HOSPITAL_COMMUNITY): Payer: Self-pay | Admitting: Physician Assistant

## 2018-01-13 DIAGNOSIS — E43 Unspecified severe protein-calorie malnutrition: Secondary | ICD-10-CM

## 2018-01-13 DIAGNOSIS — J910 Malignant pleural effusion: Secondary | ICD-10-CM

## 2018-01-13 DIAGNOSIS — D509 Iron deficiency anemia, unspecified: Secondary | ICD-10-CM

## 2018-01-13 DIAGNOSIS — Z880 Allergy status to penicillin: Secondary | ICD-10-CM

## 2018-01-13 DIAGNOSIS — C801 Malignant (primary) neoplasm, unspecified: Secondary | ICD-10-CM

## 2018-01-13 DIAGNOSIS — J90 Pleural effusion, not elsewhere classified: Secondary | ICD-10-CM | POA: Diagnosis present

## 2018-01-13 DIAGNOSIS — Z87891 Personal history of nicotine dependence: Secondary | ICD-10-CM

## 2018-01-13 DIAGNOSIS — I361 Nonrheumatic tricuspid (valve) insufficiency: Secondary | ICD-10-CM

## 2018-01-13 DIAGNOSIS — Z809 Family history of malignant neoplasm, unspecified: Secondary | ICD-10-CM

## 2018-01-13 DIAGNOSIS — Z885 Allergy status to narcotic agent status: Secondary | ICD-10-CM

## 2018-01-13 DIAGNOSIS — Z6822 Body mass index (BMI) 22.0-22.9, adult: Secondary | ICD-10-CM

## 2018-01-13 HISTORY — PX: IR THORACENTESIS ASP PLEURAL SPACE W/IMG GUIDE: IMG5380

## 2018-01-13 LAB — BODY FLUID CELL COUNT WITH DIFFERENTIAL
EOS FL: 0 %
Lymphs, Fluid: 32 %
MONOCYTE-MACROPHAGE-SEROUS FLUID: 19 % — AB (ref 50–90)
Neutrophil Count, Fluid: 49 % — ABNORMAL HIGH (ref 0–25)
WBC FLUID: 1246 uL — AB (ref 0–1000)

## 2018-01-13 LAB — FERRITIN: Ferritin: 805 ng/mL — ABNORMAL HIGH (ref 11–307)

## 2018-01-13 LAB — CBC
HCT: 26.9 % — ABNORMAL LOW (ref 36.0–46.0)
HEMOGLOBIN: 7.9 g/dL — AB (ref 12.0–15.0)
MCH: 20.7 pg — AB (ref 26.0–34.0)
MCHC: 29.4 g/dL — AB (ref 30.0–36.0)
MCV: 70.6 fL — ABNORMAL LOW (ref 78.0–100.0)
PLATELETS: 618 10*3/uL — AB (ref 150–400)
RBC: 3.81 MIL/uL — ABNORMAL LOW (ref 3.87–5.11)
RDW: 19.1 % — AB (ref 11.5–15.5)
WBC: 11.8 10*3/uL — ABNORMAL HIGH (ref 4.0–10.5)

## 2018-01-13 LAB — BASIC METABOLIC PANEL
Anion gap: 7 (ref 5–15)
BUN: 20 mg/dL (ref 8–23)
CALCIUM: 8.4 mg/dL — AB (ref 8.9–10.3)
CO2: 26 mmol/L (ref 22–32)
CREATININE: 0.81 mg/dL (ref 0.44–1.00)
Chloride: 107 mmol/L (ref 98–111)
GFR calc Af Amer: >60 mL/min (ref >60)
GLUCOSE: 140 mg/dL — AB (ref 70–99)
POTASSIUM: 4 mmol/L (ref 3.5–5.1)
SODIUM: 140 mmol/L (ref 135–145)

## 2018-01-13 LAB — ECHOCARDIOGRAM COMPLETE
Height: 71 in
Weight: 2641.99 oz

## 2018-01-13 LAB — LACTATE DEHYDROGENASE, PLEURAL OR PERITONEAL FLUID: LD, Fluid: 754 U/L — ABNORMAL HIGH (ref 3–23)

## 2018-01-13 LAB — VITAMIN B12: VITAMIN B 12: 208 pg/mL (ref 180–914)

## 2018-01-13 LAB — GRAM STAIN

## 2018-01-13 LAB — IRON AND TIBC
Iron: 9 ug/dL — ABNORMAL LOW (ref 28–170)
SATURATION RATIOS: 6 % — AB (ref 10.4–31.8)
TIBC: 140 ug/dL — AB (ref 250–450)
UIBC: 131 ug/dL

## 2018-01-13 LAB — FOLATE: FOLATE: 5.3 ng/mL — AB (ref >5.9)

## 2018-01-13 LAB — PROTEIN, PLEURAL OR PERITONEAL FLUID: Total protein, fluid: 5 g/dL

## 2018-01-13 LAB — TROPONIN I
TROPONIN I: 0.07 ng/mL — AB (ref <0.03)
Troponin I: 0.07 ng/mL (ref <0.03)

## 2018-01-13 LAB — GLUCOSE, PLEURAL OR PERITONEAL FLUID: GLUCOSE FL: 47 mg/dL

## 2018-01-13 LAB — ABO/RH: ABO/RH(D): O POS

## 2018-01-13 MED ORDER — FERROUS SULFATE 325 (65 FE) MG PO TABS
325.0000 mg | ORAL_TABLET | Freq: Two times a day (BID) | ORAL | Status: DC
  Administered 2018-01-13 – 2018-01-16 (×6): 325 mg via ORAL
  Filled 2018-01-13 (×6): qty 1

## 2018-01-13 MED ORDER — CYANOCOBALAMIN 1000 MCG/ML IJ SOLN
1000.0000 ug | Freq: Once | INTRAMUSCULAR | Status: AC
  Administered 2018-01-13: 1000 ug via INTRAMUSCULAR
  Filled 2018-01-13: qty 1

## 2018-01-13 MED ORDER — LIDOCAINE HCL (PF) 2 % IJ SOLN
INTRAMUSCULAR | Status: DC | PRN
  Administered 2018-01-13: 10 mL

## 2018-01-13 MED ORDER — FOLIC ACID 1 MG PO TABS
1.0000 mg | ORAL_TABLET | Freq: Every day | ORAL | Status: DC
  Administered 2018-01-13 – 2018-01-16 (×4): 1 mg via ORAL
  Filled 2018-01-13 (×4): qty 1

## 2018-01-13 MED ORDER — BOOST / RESOURCE BREEZE PO LIQD CUSTOM
1.0000 | Freq: Three times a day (TID) | ORAL | Status: DC
  Administered 2018-01-14: 1 via ORAL

## 2018-01-13 MED ORDER — LIDOCAINE HCL (PF) 2 % IJ SOLN
INTRAMUSCULAR | Status: AC
  Filled 2018-01-13: qty 20

## 2018-01-13 NOTE — Plan of Care (Signed)
Discussed with patient plan of care for the evening, pain management and concerns of health with some teach back displayed

## 2018-01-13 NOTE — Progress Notes (Signed)
  Echocardiogram 2D Echocardiogram has been performed.  Jennette Dubin 01/13/2018, 12:18 PM

## 2018-01-13 NOTE — Progress Notes (Signed)
Initial Nutrition Assessment  DOCUMENTATION CODES:   Severe malnutrition in context of chronic illness  INTERVENTION:    Liberalize diet to Regular  Boost Breeze po TID, each supplement provides 250 kcal and 9 grams of protein  NUTRITION DIAGNOSIS:   Severe Malnutrition related to chronic illness(ovarian cancer) as evidenced by severe fat depletion, severe muscle depletion  GOAL:   Patient will meet greater than or equal to 90% of their needs  MONITOR:   PO intake, Supplement acceptance, Labs, Skin, Weight trends, I & O's  REASON FOR ASSESSMENT:   Consult Assessment of nutrition requirement/status  ASSESSMENT:   62 y.o. 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    Pt admitted with Pleural effusion on left [J90]   RD spoke with pt at bedside. Eating the chicken off her salad. She is quite vague with her answers to RD interview questions.  Pt does not eat 3 meals per day consistently. Seems like she grazes.  Reports she has Ensure supplements at home but she doesn't really like them. She is amenable to RD ordering her Boost Breeze clear liquid supplements here. Medications include Vitamin B12 and ferrous sulfate. Labs reviewed. Folate 5.3 (L).  Verbal with Read Back order received per Dr. Tyrell Antonio to liberalize pt's diet to Regular.  NUTRITION - FOCUSED PHYSICAL EXAM:    Most Recent Value  Orbital Region  Moderate depletion  Upper Arm Region  Severe depletion  Thoracic and Lumbar Region  Severe depletion  Buccal Region  Moderate depletion  Temple Region  Moderate depletion  Clavicle Bone Region  Severe depletion  Clavicle and Acromion Bone Region  Severe depletion  Scapular Bone Region  Severe depletion  Dorsal Hand  Moderate  depletion  Patellar Region  Severe depletion  Anterior Thigh Region  Severe depletion  Posterior Calf Region  Severe depletion  Edema (RD Assessment)  None     Diet Order:   Diet Order           Diet Heart Room service appropriate? Yes; Fluid consistency: Thin  Diet effective now         EDUCATION NEEDS:   No education needs have been identified at this time  Skin:  Skin Assessment: Reviewed RN Assessment  Last BM:  7/23   Intake/Output Summary (Last 24 hours) at 01/13/2018 1340 Last data filed at 01/13/2018 0000 Gross per 24 hour  Intake 300 ml  Output -  Net 300 ml   Height:   Ht Readings from Last 1 Encounters:  01/12/18 5\' 11"  (1.803 m)   Weight:   Wt Readings from Last 1 Encounters:  01/13/18 165 lb 2 oz (74.9 kg)   BMI:  Body mass index is 23.03 kg/m.  Estimated Nutritional Needs:   Kcal:  2000-2200  Protein:  100-115 gm  Fluid:  2.0-2.2 L  Arthur Holms, RD, LDN Pager #: [phone] After-Hours Pager #: [phone]

## 2018-01-13 NOTE — Procedures (Signed)
PROCEDURE SUMMARY:  Successful image-guided left thoracentesis. Yielded 1.8 liters of serosanguinous fluid. Patient tolerated procedure well. No immediate complications.  Specimen was sent for labs. CXR ordered.  Joaquim Nam PA-C 01/13/2018 2:11 PM

## 2018-01-13 NOTE — Progress Notes (Signed)
PROGRESS NOTE    Tracy Flynn  SJG:[id number] DOB: 01/31/1956 DOA: 01/12/2018 PCP: Gildardo Pounds, NP    Brief Narrative: Tracy Flynn is a 62 y.o. 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  Patient has not followed up with her gynecologist.  Has an appointment on August 26 next month.  As patient comes to [facility] with complaints of worsening shortness of breath which is acutely worsened over the last 24 hours.  Denies any chest pain or productive cough fever or chills.  ED Course: In the ER x-rays showed large left pleural effusion.  Patient also has diffuse edema all over both lower extremities and abdomen.  Patient was recently placed on Lasix by primary care physician over the last 1 month which patient decreased the dose to 20 over the last 2 weeks.   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  For Thoracentesis today.  Oncology consulted.   Ovarian tumor, Metastatic cancer;  11-26-2017; CT abdomen; large pelvis mass, concerning with ovarian cancer. Ascites , multiple hepatic lesions.  11-28-2017;Pelvic U/S was ordered and it appeared she had Endometrial Thickening concerning for malignancy Unclear if she ever follow up with GYN oncology.  Dr Alvy Bimler, consulted. Patient was suppose to follow with oncology.  Appreciate Dr Alvy Bimler help.  Will probably need paracentesis tomorrow.  Check ammonia level.   Ascites ; likely malignant.   Might need paracentesis.  Continue with IV lasix.   Mild elevation of troponin.  Check ECHO.   Iron deficiency anemia;  folate deficiency. Start supplement.  Low normal B 12. Start supplement.   Severe protein caloric malnutrition; nutritionist consulted.   Anasarca; IV lasix.    DVT prophylaxis: SCD. Undergoing to procedure, thoracentesis, paracentesis.  Code Status: full code.  Family Communication: no family at bedside.   Disposition Plan: thoracentesis. Need paracentesis likely   Consultants:   Oncology.    Procedures:   Thoracentesis.   Antimicrobials:  none  Subjective: She is lying down in bed, chronic ill appearing. Cachetic.  She is willing to speak with oncology   Objective: Vitals:   01/12/18 2000 01/12/18 2133 01/13/18 0002 01/13/18 0500  BP: 126/74 130/77 127/73   Pulse: (!) 115 (!) 116 (!) 111   Resp: (!) 27 (!) 22 14   Temp:  97.8 F (36.6 C) 98.4 F (36.9 C)   TempSrc:  Oral Oral   SpO2: 99% 100% 100%   Weight:   74.9 kg (165 lb 3.2 oz) 74.9 kg (165 lb 2 oz)  Height:        Intake/Output Summary (Last 24 hours) at 01/13/2018 0736 Last data filed at 01/13/2018 0000 Gross per 24 hour  Intake 300 ml  Output -  Net 300 ml   Filed Weights   01/12/18 1656 01/13/18 0002 01/13/18 0500  Weight: 74.4 kg (164 lb) 74.9 kg (165 lb 3.2 oz) 74.9 kg (165 lb 2 oz)    Examination:  General exam: NAD, cachetic.  Respiratory system: Clear to auscultation. Respiratory  effort normal. Cardiovascular system: S1 & S2 heard, RRR. No JVD, murmurs, rubs, gallops or clicks. No pedal edema. Gastrointestinal system: distended, umbilical hernia, NT, ascites.  Central nervous system: Alert and oriented.  Extremities: trace edema Skin: No rashes, lesions or ulcers   Data Reviewed: I have personally reviewed following labs and imaging studies  CBC: Recent Labs  Lab 01/12/18 1722 01/13/18 0348  WBC 11.5* 11.8*  NEUTROABS 9.8*  --   HGB 8.6* 7.9*  HCT  27.9* 26.9*  MCV 69.8* 70.6*  PLT 695* 774*   Basic Metabolic Panel: Recent Labs  Lab 01/12/18 1722 01/12/18 2244 01/13/18 0348  NA 139  --  140  K 4.2  --  4.0  CL 104  --  107  CO2 26  --  26  GLUCOSE 80  --  140*  BUN 20  --  20  CREATININE 0.77  --  0.81  CALCIUM 8.6*  --  8.4*  MG  --  2.1  --    GFR: Estimated Creatinine Clearance: 80.5 mL/min (by C-G formula based on SCr of 0.81 mg/dL). Liver Function Tests: Recent Labs  Lab 01/12/18 1722  AST 17  ALT 10  ALKPHOS 95  BILITOT 0.4  PROT 8.3*  ALBUMIN 1.9*   No results for input(s): LIPASE, AMYLASE in the last 168 hours. No results for input(s): AMMONIA in the last 168 hours. Coagulation Profile: No results for input(s): INR, PROTIME in the last 168 hours. Cardiac Enzymes: Recent Labs  Lab 01/12/18 1722 01/12/18 2244 01/13/18 0348  TROPONINI 0.08* 0.07* 0.07*   BNP (last 3 results) No results for input(s): PROBNP in the last 8760 hours. HbA1C: No results for input(s): HGBA1C in the last 72 hours. CBG: No results for input(s): GLUCAP in the last 168 hours. Lipid Profile: No results for input(s): CHOL, HDL, LDLCALC, TRIG, CHOLHDL, LDLDIRECT in the last 72 hours. Thyroid Function Tests: No results for input(s): TSH, T4TOTAL, FREET4, T3FREE, THYROIDAB in the last 72 hours. Anemia Panel: Recent Labs    01/12/18 2244  VITAMINB12 208  FOLATE 5.3*  FERRITIN 805*  TIBC 140*  IRON 9*  RETICCTPCT 0.9   Sepsis Labs: No results for input(s): PROCALCITON, LATICACIDVEN in the last 168 hours.  No results found for this or any previous visit (from the past 240 hour(s)).       Radiology Studies: Dg Chest 2 View  Result Date: 01/12/2018 CLINICAL DATA:  62 year old with shortness of breath. History of left thoracentesis. EXAM: CHEST - 2 VIEW COMPARISON:  12/29/2017 FINDINGS: Increased densities throughout the left hemithorax. Findings compatible with a very large left pleural effusion which has increased  in size from the comparison examination. There is tracheal and mediastinal deviation towards the right and this has been present on previous examinations. Densities in the mid right chest may represent overlying structures. Probable small right pleural effusion. Limited evaluation of the cardiac silhouette. IMPRESSION: Increased left pleural fluid since 12/29/2017. Findings are compatible with a very large left pleural effusion. Electronically Signed   By: Markus Daft M.D.   On: 01/12/2018 18:08        Scheduled Meds: . furosemide  20 mg Intravenous BID  . potassium chloride  10 mEq Oral Daily   Continuous Infusions:   LOS: 0 days    Time spent: 35 minutes.     Elmarie Shiley, MD [facility] Pager [phone]  If 7PM-7AM, please contact night-coverage [email or web address] Password [facility] 01/13/2018, 7:36 AM

## 2018-01-13 NOTE — Consult Note (Signed)
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  We discussed the natural history and behavior of this kind of GYN malignancy. We discussed the approach with systemic chemotherapy.  I explained to them why surgery is not an option It appears clear to me that both the patient and her brother have very limited understanding about diagnosis of cancer in general.  They are very fixated about concerns about side effects of chemotherapy While I did not reassure them that chemotherapy does not bring side effects, I shared multiple different patients experiences whereby with treatment, the quality of life and overall performance status improved with treatment I estimated 2/3 of my patients will respond with combination chemotherapy of carboplatin and Taxol Despite advanced stage disease, if she responds to treatment, her 5-year disease-free survival is estimated in the region of 25% with treatment.  Without treatment, I suspect she would succumb to the disease in under 3 months. Despite a very prolonged discussion, they are not convinced that chemotherapy is the best treatment for her I also offered her second opinion in another institution or to be treated locally by my partner in [facility] At the end of the discussion, they have not make decision to proceed with treatment.  For that reason, I did not make follow-up appointment for them.  However, they have my numbers to call if questions arise.  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 Continue supportive care.  Severe protein calorie malnutrition Related to untreated disease.  She will continue increase oral intake  as tolerated  Iron deficiency anemia Recommend consider blood transfusion if needed  Elevated cardiac troponin, cardiac work-up in progress Would defer to primary service.  She breathes better with oxygen therapy  Goals of care discussion The patient and her brother seems to be very resistant to the idea of chemotherapy I recommend palliative care consultation to discuss goals of care  Discharge planning  Will defer to primary service.  All questions were answered. The patient knows to call the clinic with any problems, questions or concerns.  Tracy Lark, MD 01/13/2018 4:59 PM    CHIEF COMPLAINTS/PURPOSE OF CONSULTATION:  Advanced stage primary GYN malignancy, for further management  HISTORY OF PRESENTING ILLNESS:  Tracy Flynn 62 y.o. female is seen at the request by hospitalist to evaluate this patient was recently diagnosed with advanced stage primary GYN malignancy, suspicious for ovarian cancer versus endometrial cancer. Her brother, Tracy Flynn is present I have reviewed her chart and materials related to her cancer extensively and collaborated history with the patient.   She was originally seen by GYN oncologist last month.  A referral was placed for me to evaluate her in the outpatient clinic.  Numerous phone calls were made including letter sent to the patient to encourage her to be evaluated for discussion about chemotherapy treatment.  The patient did not treatment with chemotherapy and hence did not return any of the phone calls.  She initially presented with postmenopausal bleeding/vaginal discharge a month ago.  She initially presented to [facility] and was evaluated.  She has significant shortness of breath, progressive abdominal distention, significant weight gain, along with back pain.  In the emergency department,  she underwent CT scan of the chest, abdomen and pelvis which show significant findings worrisome for malignancy with associated  pleural effusion, ascites, primary ovarian/uterine malignancy, numerous lymphadenopathy, liver masses, all indicated of advanced stage disease.  She was subsequently admitted to the hospital and was evaluated by GYN oncologist.  Cytology from thoracentesis confirmed malignancy, suspicious for GYN primary.  CA-125 was elevated.  She was advised to undergo chemotherapy  However, the patient has expressed concern about side effects of chemotherapy.  Her brother adamant that he has known multiple acquaintances with different cancer diagnoses that "suffered" through chemotherapy and he does not wish for her to experience side effects of treatment and hence decided against follow-up appointment in the oncology clinic.  In the meantime, the patient was readmitted again because of significant shortness of breath.  In addition to recurrence of pleural effusion, she was noted to have elevated troponin.  Again, she was hesitant to see a medical oncologist but changed her mind today. She has progressive abdominal distention but yet have lost some weight and appetite.  She has changes in bowel habits with constipation.  Denies nausea.  After thoracentesis, her breathing has improved.  She continues to have intermittent vaginal bleeding/discharge.  She denies hematuria or hematochezia.   MEDICAL HISTORY:  Past Medical History:  Diagnosis Date  . Pleural effusion   . Uterine fibroid     SURGICAL HISTORY: Past Surgical History:  Procedure Laterality Date  . HERNIA REPAIR    . IR THORACENTESIS ASP PLEURAL SPACE W/IMG GUIDE  12/11/2017  . IR THORACENTESIS ASP PLEURAL SPACE W/IMG GUIDE  12/29/2017  . IR THORACENTESIS ASP PLEURAL SPACE W/IMG GUIDE  01/13/2018    SOCIAL HISTORY: Social History   Socioeconomic History  . Marital status: Single    Spouse name: Not on file  . Number of children: Not on file  . Years of education: Not on file  . Highest education level: Not on file  Occupational History  .  Not on file  Social Needs  . Financial resource strain: Not on file  . Food insecurity:    Worry: Not on file    Inability: Not on file  . Transportation needs:    Medical: Not on file    Non-medical: Not on file  Tobacco Use  . Smoking status: Former Research scientist (life sciences)  . Smokeless tobacco: Never Used  Substance and Sexual Activity  . Alcohol use: Never    Frequency: Never  . Drug use: Never  . Sexual activity: Not on file  Lifestyle  . Physical activity:    Days per week: Not on file    Minutes per session: Not on file  . Stress: Not on file  Relationships  . Social connections:    Talks on phone: Not on file    Gets together: Not on file    Attends religious service: Not on file    Active member of club or organization: Not on file    Attends meetings of clubs or organizations: Not on file    Relationship status: Not on file  . Intimate partner violence:    Fear of current or ex partner: Not on file    Emotionally abused: Not on file    Physically abused: Not on file    Forced sexual activity: Not on file  Other Topics Concern  . Not on file  Social History Narrative  . Not on file    FAMILY HISTORY: Family History  Problem Relation Age of Onset  .  Cancer Father     ALLERGIES:  is allergic to codeine and penicillins.  MEDICATIONS:  Current Facility-Administered Medications  Medication Dose Route Frequency Provider Last Rate Last Dose  . acetaminophen (TYLENOL) tablet 650 mg  650 mg Oral Q6H PRN Rise Patience, MD       Or  . acetaminophen (TYLENOL) suppository 650 mg  650 mg Rectal Q6H PRN Rise Patience, MD      . feeding supplement (BOOST / RESOURCE BREEZE) liquid 1 Container  1 Container Oral TID BM Regalado, Belkys A, MD      . ferrous sulfate tablet 325 mg  325 mg Oral BID WC Regalado, Belkys A, MD   325 mg at 01/13/18 1556  . folic acid (FOLVITE) tablet 1 mg  1 mg Oral Daily Regalado, Belkys A, MD   1 mg at 01/13/18 1558  . furosemide (LASIX)  injection 20 mg  20 mg Intravenous BID Rise Patience, MD   20 mg at 01/13/18 0800  . ipratropium-albuterol (DUONEB) 0.5-2.5 (3) MG/3ML nebulizer solution 3 mL  3 mL Nebulization Q6H PRN Rise Patience, MD      . lidocaine (XYLOCAINE) 2 % injection           . lidocaine (XYLOCAINE) 2 % injection   Infiltration PRN Candiss Norse A, PA-C   10 mL at 01/13/18 1349  . ondansetron (ZOFRAN) tablet 4 mg  4 mg Oral Q6H PRN Rise Patience, MD       Or  . ondansetron [facility]) injection 4 mg  4 mg Intravenous Q6H PRN Rise Patience, MD      . potassium chloride (K-DUR,KLOR-CON) CR tablet 10 mEq  10 mEq Oral Daily Rise Patience, MD   10 mEq at 01/13/18 6578    REVIEW OF SYSTEMS:   Constitutional: Denies fevers, chills or abnormal night sweats Eyes: Denies blurriness of vision, double vision or watery eyes Ears, nose, mouth, throat, and face: Denies mucositis or sore throat Skin: Denies abnormal skin rashes Lymphatics: Denies new lymphadenopathy or easy bruising Neurological:Denies numbness, tingling or new weaknesses Behavioral/Psych: Mood is stable, no new changes  All other systems were reviewed with the patient and are negative.  PHYSICAL EXAMINATION: ECOG PERFORMANCE STATUS: 2 - Symptomatic, <50% confined to bed  Vitals:   01/13/18 1356 01/13/18 1402  BP: (!) 114/54 (!) 107/92  Pulse:    Resp:    Temp:    SpO2:     Filed Weights   01/12/18 1656 01/13/18 0002 01/13/18 0500  Weight: 164 lb (74.4 kg) 165 lb 3.2 oz (74.9 kg) 165 lb 2 oz (74.9 kg)    GENERAL:alert, no distress and comfortable.  She looks thin and cachectic.  She has oxygen delivered via nasal cannula SKIN: skin color, texture, turgor are normal, no rashes or significant lesions EYES: normal, conjunctiva are pale and non-injected, sclera clear OROPHARYNX:no exudate, no erythema and lips, buccal mucosa, and tongue normal  NECK: supple, thyroid normal size, non-tender, without  nodularity LYMPH:  no palpable lymphadenopathy in the cervical, axillary or inguinal LUNGS: Reduced breath sounds on the left lung base with reduced breathing effort overall HEART: Tachycardia, no murmurs and minimum bilateral lower extremity edema ABDOMEN:abdomen soft, grossly distended with enlarged pelvic mass Musculoskeletal:no cyanosis of digits and no clubbing  PSYCH: alert & oriented x 3 with fluent speech NEURO: no focal motor/sensory deficits  LABORATORY DATA:  I have reviewed the data as listed Lab Results  Component Value Date  WBC 11.8 (H) 01/13/2018   HGB 7.9 (L) 01/13/2018   HCT 26.9 (L) 01/13/2018   MCV 70.6 (L) 01/13/2018   PLT 618 (H) 01/13/2018   Recent Labs    11/26/17 1921 11/27/17 1244  12/28/17 1705 01/12/18 1722 01/13/18 0348  NA 135 138   < > 138 139 140  K 3.8 4.4   < > 4.0 4.2 4.0  CL 103 104   < > 104 104 107  CO2 24 25   < > 23 26 26   GLUCOSE 108* 102*   < > 86 80 140*  BUN 12 11   < > 13 20 20   CREATININE 0.89 0.72   < > 0.72 0.77 0.81  CALCIUM 8.4* 8.7*   < > 8.8* 8.6* 8.4*  GFRNONAA >60 >60   < > >60 >60 >60  GFRAA >60 >60   < > >60 >60 >60  PROT 9.0* 8.3*  --   --  8.3*  --   ALBUMIN 2.6* 2.5*  --   --  1.9*  --   AST 14* 13*  --   --  17  --   ALT 8* 9*  --   --  10  --   ALKPHOS 51 44  --   --  95  --   BILITOT 0.5 0.5  --   --  0.4  --    < > = values in this interval not displayed.    RADIOGRAPHIC STUDIES: I have personally reviewed the radiological images as listed and agreed with the findings in the report. Dg Chest 1 View  Result Date: 01/13/2018 CLINICAL DATA:  Post thoracentesis. EXAM: CHEST  1 VIEW COMPARISON:  Chest x-ray dated 01/12/2018. FINDINGS: Improved aeration of the LEFT lung status post thoracentesis today. Persistent effusion and/or airspace collapse within the LEFT mid and lower lung zones. No pneumothorax seen. RIGHT lung remains clear. IMPRESSION: Improved aeration of the LEFT lung status post thoracentesis. No  pneumothorax seen. Electronically Signed   By: Franki Cabot M.D.   On: 01/13/2018 14:25   Dg Chest 1 View  Result Date: 12/29/2017 CLINICAL DATA:  Status post left thoracentesis EXAM: CHEST  1 VIEW COMPARISON:  12/28/2017 FINDINGS: Small-moderate left pleural effusion. Left lower lobe atelectasis. Trace right pleural effusion. No pneumothorax. Stable cardiomediastinal silhouette. No acute osseous abnormality. IMPRESSION: Small-moderate left pleural effusion with atelectasis. No left pneumothorax. Electronically Signed   By: Kathreen Devoid   On: 12/29/2017 12:11   Dg Chest 2 View  Result Date: 01/12/2018 CLINICAL DATA:  62 year old with shortness of breath. History of left thoracentesis. EXAM: CHEST - 2 VIEW COMPARISON:  12/29/2017 FINDINGS: Increased densities throughout the left hemithorax. Findings compatible with a very large left pleural effusion which has increased in size from the comparison examination. There is tracheal and mediastinal deviation towards the right and this has been present on previous examinations. Densities in the mid right chest may represent overlying structures. Probable small right pleural effusion. Limited evaluation of the cardiac silhouette. IMPRESSION: Increased left pleural fluid since 12/29/2017. Findings are compatible with a very large left pleural effusion. Electronically Signed   By: Markus Daft M.D.   On: 01/12/2018 18:08   Dg Chest 2 View  Result Date: 12/28/2017 CLINICAL DATA:  Shortness of breath, LEFT chest pain and cough. EXAM: CHEST - 2 VIEW COMPARISON:  Chest radiograph December 11, 2017 FINDINGS: Increased large LEFT pleural effusion with mediastinal shift to the RIGHT. Dense underlying consolidation. RIGHT lung  is clear. The heart borders obscured on LEFT. No pneumothorax. Soft tissue planes and included osseous structures are non suspicious. Staple projecting LEFT neck is likely superficial to patient. IMPRESSION: Re-accumulation of large LEFT pleural effusion  with mediastinal shift to the RIGHT. Underlying consolidation or mass is possible. Acute findings discussed with and reconfirmed by Dr.JOSHUA LONG on 12/28/2017 at 5:43 pm. Electronically Signed   By: Elon Alas M.D.   On: 12/28/2017 17:43   Ir Thoracentesis Asp Pleural Space W/img Guide  Result Date: 01/13/2018 INDICATION: Shortness of breath; recurrent pleural effusion. CT abdomen/pelvis from 6/8 concerning for ovarian neoplasm with metastases to liver. Request for diagnostic and therapeutic thoracentesis. EXAM: ULTRASOUND GUIDED LEFT THORACENTESIS MEDICATIONS: 10 mL 2% lidocaine. COMPLICATIONS: None immediate. PROCEDURE: An ultrasound guided thoracentesis was thoroughly discussed with the patient and questions answered. The benefits, risks, alternatives and complications were also discussed. The patient understands and wishes to proceed with the procedure. Written consent was obtained. Ultrasound was performed to localize and mark an adequate pocket of fluid in the left chest. The area was then prepped and draped in the normal sterile fashion. 2% Lidocaine was used for local anesthesia. Under ultrasound guidance a 6 Fr Safe-T-Centesis catheter was introduced. Thoracentesis was performed. The catheter was removed and a dressing applied. FINDINGS: A total of approximately 1.8 L of serosanguineous fluid was removed. Samples were sent to the laboratory as requested by the clinical team. IMPRESSION: Successful ultrasound guided left thoracentesis yielding 1.8 L of pleural fluid. Read by Candiss Norse, PA-C Electronically Signed   By: Sandi Mariscal M.D.   On: 01/13/2018 14:33   Ir Thoracentesis Asp Pleural Space W/img Guide  Result Date: 12/29/2017 INDICATION: History of pelvic cystic mass concerning for ovarian neoplasm, ascites, liver lesions, and large left pleural effusion. Request for therapeutic thoracentesis. EXAM: ULTRASOUND GUIDED LEFT THORACENTESIS MEDICATIONS: 1% lidocaine 10 mL COMPLICATIONS:  None immediate. PROCEDURE: An ultrasound guided thoracentesis was thoroughly discussed with the patient and questions answered. The benefits, risks, alternatives and complications were also discussed. The patient understands and wishes to proceed with the procedure. Written consent was obtained. Ultrasound was performed to localize and mark an adequate pocket of fluid in the left chest. The area was then prepped and draped in the normal sterile fashion. 1% Lidocaine was used for local anesthesia. Under ultrasound guidance a 6 Fr Safe-T-Centesis catheter was introduced. Thoracentesis was performed. The catheter was removed and a dressing applied. FINDINGS: A total of approximately 2.3 L of bloody fluid was removed. IMPRESSION: Successful ultrasound guided left thoracentesis yielding 2.3 L of pleural fluid. No pneumothorax on post procedure chest x-ray. Read by: Gareth Eagle, PA-C Electronically Signed   By: Corrie Mckusick D.O.   On: 12/29/2017 12:59

## 2018-01-14 DIAGNOSIS — C799 Secondary malignant neoplasm of unspecified site: Secondary | ICD-10-CM

## 2018-01-14 DIAGNOSIS — Z7189 Other specified counseling: Secondary | ICD-10-CM

## 2018-01-14 DIAGNOSIS — Z515 Encounter for palliative care: Secondary | ICD-10-CM

## 2018-01-14 LAB — BASIC METABOLIC PANEL
Anion gap: 8 (ref 5–15)
BUN: 16 mg/dL (ref 8–23)
CHLORIDE: 106 mmol/L (ref 98–111)
CO2: 26 mmol/L (ref 22–32)
Calcium: 8.1 mg/dL — ABNORMAL LOW (ref 8.9–10.3)
Creatinine, Ser: 0.66 mg/dL (ref 0.44–1.00)
GFR calc Af Amer: >60 mL/min (ref >60)
GFR calc non Af Amer: >60 mL/min (ref >60)
Glucose, Bld: 114 mg/dL — ABNORMAL HIGH (ref 70–99)
POTASSIUM: 3.5 mmol/L (ref 3.5–5.1)
SODIUM: 140 mmol/L (ref 135–145)

## 2018-01-14 LAB — HEPATIC FUNCTION PANEL
ALBUMIN: 1.6 g/dL — AB (ref 3.5–5.0)
ALT: 10 U/L (ref 0–44)
AST: 17 U/L (ref 15–41)
Alkaline Phosphatase: 89 U/L (ref 38–126)
BILIRUBIN INDIRECT: 0.4 mg/dL (ref 0.3–0.9)
Bilirubin, Direct: 0.1 mg/dL (ref 0.0–0.2)
TOTAL PROTEIN: 7.3 g/dL (ref 6.5–8.1)
Total Bilirubin: 0.5 mg/dL (ref 0.3–1.2)

## 2018-01-14 LAB — CBC
HCT: 25 % — ABNORMAL LOW (ref 36.0–46.0)
Hemoglobin: 7.3 g/dL — ABNORMAL LOW (ref 12.0–15.0)
MCH: 20.7 pg — AB (ref 26.0–34.0)
MCHC: 29.2 g/dL — ABNORMAL LOW (ref 30.0–36.0)
MCV: 71 fL — ABNORMAL LOW (ref 78.0–100.0)
Platelets: 621 10*3/uL — ABNORMAL HIGH (ref 150–400)
RBC: 3.52 MIL/uL — AB (ref 3.87–5.11)
RDW: 19.3 % — ABNORMAL HIGH (ref 11.5–15.5)
WBC: 10.7 10*3/uL — AB (ref 4.0–10.5)

## 2018-01-14 LAB — LACTATE DEHYDROGENASE: LDH: 135 U/L (ref 98–192)

## 2018-01-14 LAB — AMMONIA: AMMONIA: 30 umol/L (ref 9–35)

## 2018-01-14 LAB — PREPARE RBC (CROSSMATCH)

## 2018-01-14 MED ORDER — SODIUM CHLORIDE 0.9% IV SOLUTION
Freq: Once | INTRAVENOUS | Status: AC
  Administered 2018-01-14: 08:00:00 via INTRAVENOUS

## 2018-01-14 MED ORDER — VITAMIN B-12 100 MCG PO TABS
100.0000 ug | ORAL_TABLET | Freq: Every day | ORAL | Status: DC
  Administered 2018-01-14 – 2018-01-16 (×3): 100 ug via ORAL
  Filled 2018-01-14 (×3): qty 1

## 2018-01-14 NOTE — Progress Notes (Signed)
OT Cancellation Note  Patient Details Name: Tracy Flynn MRN: [id number] DOB: February 20, 1956   Cancelled Treatment:    Reason Eval/Treat Not Completed: OT screened, no needs identified, will sign off  Malka So 01/14/2018, 10:37 AM

## 2018-01-14 NOTE — Progress Notes (Addendum)
PROGRESS NOTE    Tracy Flynn  CWU:[id number] DOB: Dec 05, 1955 DOA: 01/12/2018 PCP: Gildardo Pounds, NP    Brief Narrative: Tracy Flynn is a 62 y.o. 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  Patient has not followed up with her gynecologist.  Has an appointment on August 26 next month.  As patient comes to [facility] with complaints of worsening shortness of breath which is acutely worsened over the last 24 hours.  Denies any chest pain or productive cough fever or chills.  ED Course: In the ER x-rays showed large left pleural effusion.  Patient also has diffuse edema all over both lower extremities and abdomen.  Patient was recently placed on Lasix by primary care physician over the last 1 month which patient decreased the dose to 20 over the last 2 weeks.  Admitted with recurrent pleural effusion, likely malignant. She also has ascites and anemia. Oncology consulted for ovarian    Assessment & Plan:   Principal Problem:   Pleural effusion on left Active Problems:   Anasarca   Severe protein-calorie malnutrition (HCC)   Normochromic normocytic anemia   Recurrent pleural effusion on left  1-Acute hypoxic Respiratory Failure; secondary to malignant Pleural effusion;  Pleural fluid cytology 11-27-2017 consistent with malignant cell, adenocarcinoma GYN primary.  Underwent thoracentesis 7-24, 1.8 L removed  Oncology consulted.  Pleural fluids; WBC 1246, follow culture. LDH pleural fluid 754, protein 5.0. Will check LDH, Protein.  Continue with IV lasix.   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  11-26-2017; CT abdomen; large pelvis mass, concerning with ovarian cancer. Ascites , multiple  hepatic lesions.  11-28-2017;Pelvic U/S was ordered and it appeared she had Endometrial Thickening concerning for malignancy Unclear if she ever follow up with GYN oncology.  Dr Alvy Bimler, consulted. Patient was suppose to follow with oncology.  Appreciate Dr Alvy Bimler help.  Patient declining chemotherapy. Palliative consulted.  Check ammonia level.  Will order paracentesis.   Ascites ; likely malignant.  Might need paracentesis.  Continue with IV lasix.  Paracentesis ordered.   Mild elevation of troponin.  ECHO. EF 50 % Conservative management.   Iron deficiency anemia; anemia, symptomatic tachycardia.  folate deficiency. Started  supplement.  Low normal B 12. Started  supplement.  Transfuse one unit PRBC today   Severe protein caloric malnutrition; nutritionist consulted.   Anasarca; IV lasix.    DVT prophylaxis: SCD. Undergoing to procedure, thoracentesis, paracentesis.  Code Status: full code.  Family Communication: no family at bedside.   Disposition Plan: thoracentesis. Need paracentesis likely   Consultants:   Oncology.    Procedures:   Thoracentesis.   Antimicrobials:  none  Subjective: She is breathing better, report abdominal distension.   Objective: Vitals:   01/14/18 0641 01/14/18 0802 01/14/18 0837 01/14/18 1215  BP: 122/64 131/76 131/77 128/76  Pulse: 92 88  92  Resp: 16 18 16 16   Temp: (!) 97.5 F (36.4 C) 98.4 F (36.9 C) 98.3 F (36.8 C) 98.7 F (37.1 C)  TempSrc: Oral  Oral Oral  SpO2: 100% 100% 98% 98%  Weight: 72.7 kg (160 lb 4.8 oz)     Height:        Intake/Output Summary (Last 24 hours) at 01/14/2018 1508 Last  data filed at 01/14/2018 1215 Gross per 24 hour  Intake 1390 ml  Output -  Net 1390 ml   Filed Weights   01/13/18 0002 01/13/18 0500 01/14/18 0641  Weight: 74.9 kg (165 lb 3.2 oz) 74.9 kg (165 lb 2 oz) 72.7 kg (160 lb 4.8 oz)    Examination:  General exam: NAD, Cachetic Respiratory system: normal respiratory  effort, CTA Cardiovascular system: S 1, S 2 RRR Gastrointestinal system: Distended, ascites, umbilical  Hernia.  Central nervous system: Alert, Non focal.  Extremities: no edema Skin: no rashes.    Data Reviewed: I have personally reviewed following labs and imaging studies  CBC: Recent Labs  Lab 01/12/18 1722 01/13/18 0348 01/14/18 0334  WBC 11.5* 11.8* 10.7*  NEUTROABS 9.8*  --   --   HGB 8.6* 7.9* 7.3*  HCT 27.9* 26.9* 25.0*  MCV 69.8* 70.6* 71.0*  PLT 695* 618* 400*   Basic Metabolic Panel: Recent Labs  Lab 01/12/18 1722 01/12/18 2244 01/13/18 0348 01/14/18 0334  NA 139  --  140 140  K 4.2  --  4.0 3.5  CL 104  --  107 106  CO2 26  --  26 26  GLUCOSE 80  --  140* 114*  BUN 20  --  20 16  CREATININE 0.77  --  0.81 0.66  CALCIUM 8.6*  --  8.4* 8.1*  MG  --  2.1  --   --    GFR: Estimated Creatinine Clearance: 81.5 mL/min (by C-G formula based on SCr of 0.66 mg/dL). Liver Function Tests: Recent Labs  Lab 01/12/18 1722  AST 17  ALT 10  ALKPHOS 95  BILITOT 0.4  PROT 8.3*  ALBUMIN 1.9*   No results for input(s): LIPASE, AMYLASE in the last 168 hours. No results for input(s): AMMONIA in the last 168 hours. Coagulation Profile: No results for input(s): INR, PROTIME in the last 168 hours. Cardiac Enzymes: Recent Labs  Lab 01/12/18 1722 01/12/18 2244 01/13/18 0348 01/13/18 1041  TROPONINI 0.08* 0.07* 0.07* 0.07*   BNP (last 3 results) No results for input(s): PROBNP in the last 8760 hours. HbA1C: No results for input(s): HGBA1C in the last 72 hours. CBG: No results for input(s): GLUCAP in the last 168 hours. Lipid Profile: No results for input(s): CHOL, HDL, LDLCALC, TRIG, CHOLHDL, LDLDIRECT in the last 72 hours. Thyroid Function Tests: No results for input(s): TSH, T4TOTAL, FREET4, T3FREE, THYROIDAB in the last 72 hours. Anemia Panel: Recent Labs    01/12/18 2244  VITAMINB12 208  FOLATE 5.3*  FERRITIN 805*  TIBC 140*  IRON 9*  RETICCTPCT  0.9   Sepsis Labs: No results for input(s): PROCALCITON, LATICACIDVEN in the last 168 hours.  Recent Results (from the past 240 hour(s))  Gram stain     Status: None   Collection Time: 01/13/18  1:50 PM  Result Value Ref Range Status   Specimen Description PLEURAL LEFT  Final   Special Requests NONE  Final   Gram Stain   Final    ABUNDANT WBC PRESENT,BOTH PMN AND MONONUCLEAR NO ORGANISMS SEEN Performed at [facility], [address] [place], [place] [address]    Report Status 01/13/2018 FINAL  Final  Culture, body fluid-bottle     Status: None (Preliminary result)   Collection Time: 01/13/18  1:50 PM  Result Value Ref Range Status   Specimen Description PLEURAL LEFT  Final   Special Requests NONE  Final   Culture   Final  NO GROWTH < 24 HOURS Performed at [facility] [address] [place], [place] [address]    Report Status PENDING  Incomplete         Radiology Studies: Dg Chest 1 View  Result Date: 01/13/2018 CLINICAL DATA:  Post thoracentesis. EXAM: CHEST  1 VIEW COMPARISON:  Chest x-ray dated 01/12/2018. FINDINGS: Improved aeration of the LEFT lung status post thoracentesis today. Persistent effusion and/or airspace collapse within the LEFT mid and lower lung zones. No pneumothorax seen. RIGHT lung remains clear. IMPRESSION: Improved aeration of the LEFT lung status post thoracentesis. No pneumothorax seen. Electronically Signed   By: Franki Cabot M.D.   On: 01/13/2018 14:25   Dg Chest 2 View  Result Date: 01/12/2018 CLINICAL DATA:  62 year old with shortness of breath. History of left thoracentesis. EXAM: CHEST - 2 VIEW COMPARISON:  12/29/2017 FINDINGS: Increased densities throughout the left hemithorax. Findings compatible with a very large left pleural effusion which has increased in size from the comparison examination. There is tracheal and mediastinal deviation towards the right and this has been present on previous examinations. Densities in the  mid right chest may represent overlying structures. Probable small right pleural effusion. Limited evaluation of the cardiac silhouette. IMPRESSION: Increased left pleural fluid since 12/29/2017. Findings are compatible with a very large left pleural effusion. Electronically Signed   By: Markus Daft M.D.   On: 01/12/2018 18:08   Ir Thoracentesis Asp Pleural Space W/img Guide  Result Date: 01/13/2018 INDICATION: Shortness of breath; recurrent pleural effusion. CT abdomen/pelvis from 6/8 concerning for ovarian neoplasm with metastases to liver. Request for diagnostic and therapeutic thoracentesis. EXAM: ULTRASOUND GUIDED LEFT THORACENTESIS MEDICATIONS: 10 mL 2% lidocaine. COMPLICATIONS: None immediate. PROCEDURE: An ultrasound guided thoracentesis was thoroughly discussed with the patient and questions answered. The benefits, risks, alternatives and complications were also discussed. The patient understands and wishes to proceed with the procedure. Written consent was obtained. Ultrasound was performed to localize and mark an adequate pocket of fluid in the left chest. The area was then prepped and draped in the normal sterile fashion. 2% Lidocaine was used for local anesthesia. Under ultrasound guidance a 6 Fr Safe-T-Centesis catheter was introduced. Thoracentesis was performed. The catheter was removed and a dressing applied. FINDINGS: A total of approximately 1.8 L of serosanguineous fluid was removed. Samples were sent to the laboratory as requested by the clinical team. IMPRESSION: Successful ultrasound guided left thoracentesis yielding 1.8 L of pleural fluid. Read by Candiss Norse, PA-C Electronically Signed   By: Sandi Mariscal M.D.   On: 01/13/2018 14:33        Scheduled Meds: . feeding supplement  1 Container Oral TID BM  . ferrous sulfate  325 mg Oral BID WC  . folic acid  1 mg Oral Daily  . furosemide  20 mg Intravenous BID  . potassium chloride  10 mEq Oral Daily   Continuous  Infusions:   LOS: 1 day    Time spent: 35 minutes.     Elmarie Shiley, MD [facility] Pager [phone]  If 7PM-7AM, please contact night-coverage [email or web address] Password [facility] 01/14/2018, 3:08 PM

## 2018-01-14 NOTE — Telephone Encounter (Signed)
Noted. Agreed.

## 2018-01-14 NOTE — Evaluation (Signed)
Physical Therapy Evaluation & Discharge Patient Details Name: Tracy Flynn MRN: [id number] DOB: 1955-12-06 Today's Date: 01/14/2018   History of Present Illness  Pt is a 62 y.o. female with recent admission for with workup concerning for ovarian neoplasm with metastasis; now admitted 01/12/18 with worsening SOB. Found large left pleural effusion and malignant ascites; now s/p thoracentesis. Worked up for primary GYN malignancy, possible R ovarian cancer vs endometrial cancer, stage IV.     Clinical Impression  Patient evaluated by Physical Therapy with no further acute PT needs identified. PTA, pt indep but has noticed increased DOE and fatigue with exertion; working and drives. Today, pt indep with mobility. SpO2 96% while ambulating on RA. Educ on energy conservation and handout provided. All education has been completed and the patient has no further questions. Encouraged to continue ambulating in hallway during hospital admission (RN aware). PT is signing off. Thank you for this referral.    Follow Up Recommendations No PT follow up    Equipment Recommendations  None recommended by PT    Recommendations for Other Services       Precautions / Restrictions Precautions Precautions: None Restrictions Weight Bearing Restrictions: No      Mobility  Bed Mobility Overal bed mobility: Independent                Transfers Overall transfer level: Independent                  Ambulation/Gait Ambulation/Gait assistance: Independent   Assistive device: None Gait Pattern/deviations: WFL(Within Functional Limits)     General Gait Details: SpO2 96% on RA while ambulating  Stairs            Wheelchair Mobility    Modified Rankin (Stroke Patients Only)       Balance Overall balance assessment: No apparent balance deficits (not formally assessed)                                           Pertinent Vitals/Pain Pain Assessment: No/denies  pain    Home Living Family/patient expects to be discharged to:: Private residence Living Arrangements: Alone Available Help at Discharge: Family;Friend(s);Available PRN/intermittently Type of Home: House Home Access: Stairs to enter     Home Layout: Multi-level Home Equipment: None      Prior Function Level of Independence: Independent         Comments: Works part-time on her feet. Reports stairs make her feel winded. Drives     Hand Dominance        Extremity/Trunk Assessment   Upper Extremity Assessment Upper Extremity Assessment: Overall WFL for tasks assessed    Lower Extremity Assessment Lower Extremity Assessment: Overall WFL for tasks assessed    Cervical / Trunk Assessment Cervical / Trunk Assessment: (Abdominal bloating/ascites)  Communication   Communication: No difficulties  Cognition Arousal/Alertness: Awake/alert Behavior During Therapy: WFL for tasks assessed/performed Overall Cognitive Status: Within Functional Limits for tasks assessed                                        General Comments      Exercises     Assessment/Plan    PT Assessment Patent does not need any further PT services  PT Problem List  PT Treatment Interventions      PT Goals (Current goals can be found in the Care Plan section)  Acute Rehab PT Goals PT Goal Formulation: All assessment and education complete, DC therapy    Frequency     Barriers to discharge        Co-evaluation               AM-PAC PT "6 Clicks" Daily Activity  Outcome Measure Difficulty turning over in bed (including adjusting bedclothes, sheets and blankets)?: None Difficulty moving from lying on back to sitting on the side of the bed? : None Difficulty sitting down on and standing up from a chair with arms (e.g., wheelchair, bedside commode, etc,.)?: None Help needed moving to and from a bed to chair (including a wheelchair)?: None Help needed walking in  hospital room?: None Help needed climbing 3-5 steps with a railing? : None 6 Click Score: 24    End of Session Equipment Utilized During Treatment: Gait belt Activity Tolerance: Patient tolerated treatment well Patient left: in chair;with call bell/phone within reach;with nursing/sitter in room Nurse Communication: Mobility status PT Visit Diagnosis: Other abnormalities of gait and mobility (R26.89)    Time: 3212-2482 PT Time Calculation (min) (ACUTE ONLY): 13 min   Charges:   PT Evaluation $PT Eval Low Complexity: 1 Low     PT G Codes:       Mabeline Caras, PT, DPT Acute Rehab Services  Pager: [place] 01/14/2018, 9:38 AM

## 2018-01-14 NOTE — Consult Note (Signed)
Consultation Note Date: 01/14/2018   Patient Name: Tracy Flynn  DOB: [birth year]  MRN: [id number]  Age / Sex: 62 y.o., female  PCP: Gildardo Pounds, NP Referring Physician: Elmarie Shiley, MD  Reason for Consultation: Establishing goals of care, symptom management  HPI/Patient Profile: 62 y.o. female  with past medical history of uterine fibroid, pleural effusion on last admission which led to concern for ovarian malignancy with mets but she had not followed up admitted on 01/12/2018 with recurrent SOB and with recurrent large left pleural effusion. Thoracentesis 1.8L removed 01/14/18. She is struggling with her desires and what she wants to do for new cancer diagnosis.   Clinical Assessment and Goals of Care: I met today with Deneise Lever. No family or visitors at bedside when I come to visit. Dashawn was initially very flat although pleasant but did engage in conversation with me and open up after a little bit.   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 She has 1 son, never married, mother is living, and has 3 sisters and 1 brother. He brother and son are the only people who really know her diagnosis. She is worried about what this will do to her mother. She has 1 granddaughter who is 6 yo and lives with her mother in [place]. She is sad that her granddaughter has not come this summer as she normally does (says the mother is being difficult).   We also discussed her cancer diagnosis, options, and prognosis. Lynsi has known many people close to her that have lived through and died from [place] including her father and close friends. Chyann appears to be terrified of chemotherapy and the thought of wasting away and being miserable on chemotherapy. She was able to repeat to may that Dr. Alvy Bimler told her that she could potentially live years with treatment and that she would likely only live a few months with no treatment. She understands the  consequences of her decision but has much fear from what she has experienced with loved ones.   Lashelle was tearful through our conversation and tells me this is the first time she has cried. If she were to opt to pursue treatment this would be to have more time and see her granddaughter grow up more. If she opts for no treatment it is because she fears the QOL that treatment would give her. She also notes "it is already stage IV." I encouraged her to continue to think about what is right for her but also told her that if she believes she may want treatment that the sooner the better to start this. We also discussed that it would be her decision to stop treatment at any point as well.   She continues to be overwhelmed. I will continue to follow and try to help her contemplate her [place]. Emotional support provided.   Primary Decision Maker PATIENT    SUMMARY OF RECOMMENDATIONS   - Discussed her thoughts/fears with treatment options - Working to build rapport - Will continue to attempt to elicit [place]  Code  Status/Advance Care Planning:  Full code - did not discuss   Symptom Management:   SOB: Relieved after thoracentesis. Would likely benefit from PleurX catheter especially if she foregoes chemotherapy and opts for comfort focused care. Abd distended and watch closely for ascites and need for paracentesis given concern for liver mets.   Palliative Prophylaxis:   Bowel Regimen and Frequent Pain Assessment  Psycho-social/Spiritual:   Desire for further Chaplaincy support:no  Additional Recommendations: Grief/Bereavement Support  Prognosis:   Dependent on her decisions and aggressiveness of care. If she does not want chemotherapy she is eligible for hospice services.   Discharge Planning: To Be Determined      Primary Diagnoses: Present on Admission: . Pleural effusion on left . Anasarca . Severe protein-calorie malnutrition ([place]) . Normochromic normocytic anemia . Recurrent  pleural effusion on left   I have reviewed the medical record, interviewed the patient and family, and examined the patient. The following aspects are pertinent.  Past Medical History:  Diagnosis Date  . Pleural effusion   . Uterine fibroid    Social History   Socioeconomic History  . Marital status: Single    Spouse name: Not on file  . Number of children: Not on file  . Years of education: Not on file  . Highest education level: Not on file  Occupational History  . Not on file  Social Needs  . Financial resource strain: Not on file  . Food insecurity:    Worry: Not on file    Inability: Not on file  . Transportation needs:    Medical: Not on file    Non-medical: Not on file  Tobacco Use  . Smoking status: Former Research scientist (life sciences)  . Smokeless tobacco: Never Used  Substance and Sexual Activity  . Alcohol use: Never    Frequency: Never  . Drug use: Never  . Sexual activity: Not on file  Lifestyle  . Physical activity:    Days per week: Not on file    Minutes per session: Not on file  . Stress: Not on file  Relationships  . Social connections:    Talks on phone: Not on file    Gets together: Not on file    Attends religious service: Not on file    Active member of club or organization: Not on file    Attends meetings of clubs or organizations: Not on file    Relationship status: Not on file  Other Topics Concern  . Not on file  Social History Narrative  . Not on file   Family History  Problem Relation Age of Onset  . Cancer Father    Scheduled Meds: . feeding supplement  1 Container Oral TID BM  . ferrous sulfate  325 mg Oral BID WC  . folic acid  1 mg Oral Daily  . furosemide  20 mg Intravenous BID  . potassium chloride  10 mEq Oral Daily  . vitamin B-12  100 mcg Oral Daily   Continuous Infusions: PRN Meds:.acetaminophen **OR** acetaminophen, ipratropium-albuterol, lidocaine, ondansetron **OR** ondansetron (ZOFRAN) IV Allergies  Allergen Reactions  . Codeine  Other (See Comments)    Unknown  . Penicillins     Has patient had a PCN reaction causing immediate rash, facial/tongue/throat swelling, SOB or lightheadedness with hypotension: {Yes Has patient had a PCN reaction causing severe rash involving mucus membranes or skin necrosis:No Has patient had a PCN reaction that required hospitalization: No Has patient had a PCN reaction occurring within the last 10 years:  No If all of the above answers are "NO", then may proceed with Cephalosporin use.    Review of Systems  Constitutional: Positive for activity change, fatigue and unexpected weight change. Negative for appetite change.  Respiratory:       SOB resolved currently  Cardiovascular: Positive for leg swelling.  Gastrointestinal: Positive for abdominal distention.  Neurological: Positive for weakness.    Physical Exam  Constitutional: She is oriented to person, place, and time. She appears well-developed. She appears cachectic. She has a sickly appearance.  HENT:  Head: Normocephalic and atraumatic.  Cardiovascular: Normal rate.  Pulmonary/Chest: Effort normal. No accessory muscle usage. No tachypnea. No respiratory distress.  Abdominal: She exhibits distension.  Neurological: She is alert and oriented to person, place, and time.  Nursing note and vitals reviewed.   Vital Signs: BP 133/76 (BP Location: Left Arm)   Pulse (!) 114   Temp 99.2 F (37.3 C) (Oral)   Resp (!) 24   Ht 5' 11"  (1.803 m)   Wt 72.7 kg (160 lb 4.8 oz)   SpO2 100%   BMI 22.36 kg/m  Pain Scale: 0-10   Pain Score: 0-No pain   SpO2: SpO2: 100 % O2 Device:SpO2: 100 % O2 Flow Rate: .O2 Flow Rate (L/min): 0 L/min  IO: Intake/output summary:   Intake/Output Summary (Last 24 hours) at 01/14/2018 1739 Last data filed at 01/14/2018 1215 Gross per 24 hour  Intake 1390 ml  Output -  Net 1390 ml    LBM: Last BM Date: 01/14/18 Baseline Weight: Weight: 74.4 kg (164 lb) Most recent weight: Weight: 72.7 kg  (160 lb 4.8 oz)     Palliative Assessment/Data:     Time Total: 90 min  Greater than 50%  of this time was spent counseling and coordinating care related to the above assessment and plan.  Signed by: Vinie Sill, NP Palliative Medicine Team Pager # [phone] (M-F 8a-5p) Team Phone # [phone] (Nights/Weekends)

## 2018-01-15 ENCOUNTER — Encounter (HOSPITAL_COMMUNITY): Payer: Self-pay | Admitting: Physician Assistant

## 2018-01-15 ENCOUNTER — Inpatient Hospital Stay (HOSPITAL_COMMUNITY): Payer: Self-pay

## 2018-01-15 DIAGNOSIS — J90 Pleural effusion, not elsewhere classified: Secondary | ICD-10-CM

## 2018-01-15 HISTORY — PX: IR PARACENTESIS: IMG2679

## 2018-01-15 LAB — CBC
HCT: 28.9 % — ABNORMAL LOW (ref 36.0–46.0)
Hemoglobin: 8.7 g/dL — ABNORMAL LOW (ref 12.0–15.0)
MCH: 21.9 pg — ABNORMAL LOW (ref 26.0–34.0)
MCHC: 30.1 g/dL (ref 30.0–36.0)
MCV: 72.6 fL — AB (ref 78.0–100.0)
Platelets: 586 10*3/uL — ABNORMAL HIGH (ref 150–400)
RBC: 3.98 MIL/uL (ref 3.87–5.11)
RDW: 18.9 % — ABNORMAL HIGH (ref 11.5–15.5)
WBC: 14.3 10*3/uL — ABNORMAL HIGH (ref 4.0–10.5)

## 2018-01-15 LAB — TYPE AND SCREEN
ABO/RH(D): O POS
ANTIBODY SCREEN: NEGATIVE
UNIT DIVISION: 0

## 2018-01-15 LAB — BPAM RBC
Blood Product Expiration Date: 201908152359
ISSUE DATE / TIME: 201907250814
Unit Type and Rh: 5100

## 2018-01-15 LAB — BASIC METABOLIC PANEL
ANION GAP: 7 (ref 5–15)
BUN: 12 mg/dL (ref 8–23)
CHLORIDE: 105 mmol/L (ref 98–111)
CO2: 27 mmol/L (ref 22–32)
Calcium: 8.2 mg/dL — ABNORMAL LOW (ref 8.9–10.3)
Creatinine, Ser: 0.59 mg/dL (ref 0.44–1.00)
GFR calc Af Amer: >60 mL/min (ref >60)
GLUCOSE: 106 mg/dL — AB (ref 70–99)
POTASSIUM: 3.4 mmol/L — AB (ref 3.5–5.1)
Sodium: 139 mmol/L (ref 135–145)

## 2018-01-15 LAB — PH, BODY FLUID: pH, Body Fluid: 7.2

## 2018-01-15 MED ORDER — LIDOCAINE HCL (PF) 2 % IJ SOLN
INTRAMUSCULAR | Status: DC | PRN
  Administered 2018-01-15: 10 mL

## 2018-01-15 MED ORDER — LIDOCAINE HCL (PF) 2 % IJ SOLN
INTRAMUSCULAR | Status: AC
  Filled 2018-01-15: qty 20

## 2018-01-15 MED ORDER — POTASSIUM CHLORIDE CRYS ER 10 MEQ PO TBCR
10.0000 meq | EXTENDED_RELEASE_TABLET | Freq: Two times a day (BID) | ORAL | Status: DC
  Administered 2018-01-15 – 2018-01-16 (×2): 10 meq via ORAL
  Filled 2018-01-15 (×2): qty 1

## 2018-01-15 MED ORDER — POTASSIUM CHLORIDE CRYS ER 20 MEQ PO TBCR
40.0000 meq | EXTENDED_RELEASE_TABLET | Freq: Once | ORAL | Status: AC
  Administered 2018-01-15: 40 meq via ORAL
  Filled 2018-01-15: qty 2

## 2018-01-15 NOTE — Progress Notes (Signed)
PROGRESS NOTE    Tracy Flynn  RDE:[id number] DOB: 1956-04-01 DOA: 01/12/2018 PCP: Gildardo Pounds, NP   Brief Narrative:62 y.o.femalewithhistory of recent admission in June 1 week 2 months ago for shortness of breath at that time patient was found to have large left pleural effusion which patient had thoracentesis done at the time work-up also showed features concerning for ovarian neoplasm with metastasis. Patient has not followed up with her gynecologist. Has an appointment on August 26 next month. As patient comes to [facility] with complaints of worsening shortness of breath which is acutely worsened over the last 24 hours. Denies any chest pain or productive cough fever or chills.  ED Course:In the ER x-rays showed large left pleural effusion. Patient also has diffuse edema all over both lower extremities and abdomen. Patient was recently placed on Lasix by primary care physician over the last 1 month which patient decreased the dose to 20 over the last 2 weeks.  Admitted with recurrent pleural effusion, likely malignant. She also has ascites and anemia. 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  Underwent thoracentesis 7-24, 1.8 L removed  Oncology consulted NOTED and appreciated.  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  11-26-2017; CT abdomen; large pelvis mass, concerning with ovarian cancer. Ascites , multiple hepatic lesions.    11-28-2017;Pelvic U/S was ordered and it appeared she had Endometrial Thickening concerning for malignancy Unclear if she ever follow up with GYN oncology.  Dr Alvy Bimler, consulted. Patient was suppose to follow with oncology.  Appreciate Dr Alvy Bimler help.  Patient declining chemotherapy. Palliative consulted.  Check ammonia level.   paracentesis today. Ascites ; likely malignant.  Might need paracentesis.  Continue with IV lasix.  Paracentesis ordered.   Mild elevation of troponin.  ECHO. EF 50 % Conservative management.   Iron deficiency anemia; anemia, symptomatic tachycardia.  folate deficiency. Started  supplement.  Low normal B 12. Started  supplement.  Transfuse one unit PRBC today   Severe protein caloric malnutrition; nutritionist consulted.   Anasarca; IV lasix.   HYPOKALEMIA REPLETE        DVT prophylaxis: CAD since patient is undergoing procedures thoracentesis and paracentesis  Code Status: Full code  Family Communication no family at bedside Disposition Plan TBD Consultants: Oncology palliative care  Procedures: Thoracentesis Antimicrobials: None  Subjective: Resting in bed on her left side denies shortness of breath with complaints of distended belly  Objective: Vitals:   01/14/18 1735 01/15/18 0040 01/15/18 0725 01/15/18 1053  BP: 133/76 123/69 133/71 134/77  Pulse: (!) 114 (!) 102 84   Resp: (!) 24 20 18    Temp: 99.2 F (37.3 C) 99.1 F (37.3 C) 98.4 F (36.9 C)   TempSrc: Oral     SpO2: 100% 98% 98%   Weight:      Height:        Intake/Output Summary (Last 24 hours) at 01/15/2018 1117 Last data filed at 01/15/2018 0845 Gross per 24 hour  Intake 490 ml  Output -  Net 490  ml   Filed Weights   01/13/18 0002 01/13/18 0500 01/14/18 0641  Weight: 74.9 kg (165 lb 3.2 oz) 74.9 kg (165 lb 2 oz) 72.7 kg (160 lb 4.8 oz)    Examination:  General exam: Appears calm and comfortable  Respiratory system: Clear to auscultation.  Respiratory effort normal. Cardiovascular system: S1 & S2 heard, RRR. No JVD, murmurs, rubs, gallops or clicks. No pedal edema. Gastrointestinal system: Abdomen is  VERY distended, soft and nontender. No organomegaly or masses felt. Normal bowel sounds heard. Central nervous system: Alert and oriented. No focal neurological deficits. Extremities: 2 PLUS EDEMA Skin: No rashes, lesions or ulcers Psychiatry: Judgement and insight appear normal. Mood & affect appropriate.     Data Reviewed: I have personally reviewed following labs and imaging studies  CBC: Recent Labs  Lab 01/12/18 1722 01/13/18 0348 01/14/18 0334 01/15/18 0324  WBC 11.5* 11.8* 10.7* 14.3*  NEUTROABS 9.8*  --   --   --   HGB 8.6* 7.9* 7.3* 8.7*  HCT 27.9* 26.9* 25.0* 28.9*  MCV 69.8* 70.6* 71.0* 72.6*  PLT 695* 618* 621* 408*   Basic Metabolic Panel: Recent Labs  Lab 01/12/18 1722 01/12/18 2244 01/13/18 0348 01/14/18 0334 01/15/18 0324  NA 139  --  140 140 139  K 4.2  --  4.0 3.5 3.4*  CL 104  --  107 106 105  CO2 26  --  26 26 27   GLUCOSE 80  --  140* 114* 106*  BUN 20  --  20 16 12   CREATININE 0.77  --  0.81 0.66 0.59  CALCIUM 8.6*  --  8.4* 8.1* 8.2*  MG  --  2.1  --   --   --    GFR: Estimated Creatinine Clearance: 81.5 mL/min (by C-G formula based on SCr of 0.59 mg/dL). Liver Function Tests: Recent Labs  Lab 01/12/18 1722 01/14/18 1536  AST 17 17  ALT 10 10  ALKPHOS 95 89  BILITOT 0.4 0.5  PROT 8.3* 7.3  ALBUMIN 1.9* 1.6*   No results for input(s): LIPASE, AMYLASE in the last 168 hours. Recent Labs  Lab 01/14/18 1536  AMMONIA 30   Coagulation Profile: No results for input(s): INR, PROTIME in the last 168 hours. Cardiac Enzymes: Recent Labs  Lab 01/12/18 1722 01/12/18 2244 01/13/18 0348 01/13/18 1041  TROPONINI 0.08* 0.07* 0.07* 0.07*   BNP (last 3 results) No results for input(s): PROBNP in the last 8760 hours. HbA1C: No results for input(s): HGBA1C in the last 72  hours. CBG: No results for input(s): GLUCAP in the last 168 hours. Lipid Profile: No results for input(s): CHOL, HDL, LDLCALC, TRIG, CHOLHDL, LDLDIRECT in the last 72 hours. Thyroid Function Tests: No results for input(s): TSH, T4TOTAL, FREET4, T3FREE, THYROIDAB in the last 72 hours. Anemia Panel: Recent Labs    01/12/18 2244  VITAMINB12 208  FOLATE 5.3*  FERRITIN 805*  TIBC 140*  IRON 9*  RETICCTPCT 0.9   Sepsis Labs: No results for input(s): PROCALCITON, LATICACIDVEN in the last 168 hours.  Recent Results (from the past 240 hour(s))  Gram stain     Status: None   Collection Time: 01/13/18  1:50 PM  Result Value Ref Range Status   Specimen Description PLEURAL LEFT  Final   Special Requests NONE  Final   Gram Stain   Final    ABUNDANT WBC PRESENT,BOTH PMN AND MONONUCLEAR NO ORGANISMS SEEN Performed at [facility], [address] [place], [place] [address]  Report Status 01/13/2018 FINAL  Final  Culture, body fluid-bottle     Status: None (Preliminary result)   Collection Time: 01/13/18  1:50 PM  Result Value Ref Range Status   Specimen Description PLEURAL LEFT  Final   Special Requests NONE  Final   Culture   Final    NO GROWTH 2 DAYS Performed at [facility], [address] [place], [place] [address]    Report Status PENDING  Incomplete         Radiology Studies: Dg Chest 1 View  Result Date: 01/13/2018 CLINICAL DATA:  Post thoracentesis. EXAM: CHEST  1 VIEW COMPARISON:  Chest x-ray dated 01/12/2018. FINDINGS: Improved aeration of the LEFT lung status post thoracentesis today. Persistent effusion and/or airspace collapse within the LEFT mid and lower lung zones. No pneumothorax seen. RIGHT lung remains clear. IMPRESSION: Improved aeration of the LEFT lung status post thoracentesis. No pneumothorax seen. Electronically Signed   By: Franki Cabot M.D.   On: 01/13/2018 14:25   Ir Paracentesis  Result Date: 01/15/2018 INDICATION: Ascites due to  likely metastatic ovarian cancer. Request for therapeutic paracentesis. EXAM: ULTRASOUND GUIDED THERAPEUTIC PARACENTESIS MEDICATIONS: 10 mL 2% lidocaine. COMPLICATIONS: None immediate. PROCEDURE: Informed written consent was obtained from the patient after a discussion of the risks, benefits and alternatives to treatment. A timeout was performed prior to the initiation of the procedure. Initial ultrasound scanning demonstrates a large amount of ascites within the left lower abdominal quadrant. The left lower abdomen was prepped and draped in the usual sterile fashion. 2% lidocaine was used for local anesthesia. Following this, a 19 gauge, 7-cm, Yueh catheter was introduced. An ultrasound image was saved for documentation purposes. The paracentesis was performed. The catheter was removed and a dressing was applied. The patient tolerated the procedure well without immediate post procedural complication. FINDINGS: A total of approximately 4.0L of chylous fluid was removed. Samples were not sent to the laboratory as requested by the clinical team. IMPRESSION: Successful ultrasound-guided paracentesis yielding 4.0 liters of peritoneal fluid. Read by Candiss Norse, PA-C Electronically Signed   By: Aletta Edouard M.D.   On: 01/15/2018 11:14   Ir Thoracentesis Asp Pleural Space W/img Guide  Result Date: 01/13/2018 INDICATION: Shortness of breath; recurrent pleural effusion. CT abdomen/pelvis from 6/8 concerning for ovarian neoplasm with metastases to liver. Request for diagnostic and therapeutic thoracentesis. EXAM: ULTRASOUND GUIDED LEFT THORACENTESIS MEDICATIONS: 10 mL 2% lidocaine. COMPLICATIONS: None immediate. PROCEDURE: An ultrasound guided thoracentesis was thoroughly discussed with the patient and questions answered. The benefits, risks, alternatives and complications were also discussed. The patient understands and wishes to proceed with the procedure. Written consent was obtained. Ultrasound was performed  to localize and mark an adequate pocket of fluid in the left chest. The area was then prepped and draped in the normal sterile fashion. 2% Lidocaine was used for local anesthesia. Under ultrasound guidance a 6 Fr Safe-T-Centesis catheter was introduced. Thoracentesis was performed. The catheter was removed and a dressing applied. FINDINGS: A total of approximately 1.8 L of serosanguineous fluid was removed. Samples were sent to the laboratory as requested by the clinical team. IMPRESSION: Successful ultrasound guided left thoracentesis yielding 1.8 L of pleural fluid. Read by Candiss Norse, PA-C Electronically Signed   By: Sandi Mariscal M.D.   On: 01/13/2018 14:33        Scheduled Meds: . feeding supplement  1 Container Oral TID BM  . ferrous sulfate  325 mg Oral BID WC  . folic acid  1 mg Oral Daily  . furosemide  20 mg Intravenous BID  . lidocaine      . potassium chloride  10 mEq Oral Daily  . vitamin B-12  100 mcg Oral Daily   Continuous Infusions:   LOS: 2 days     Georgette Shell, MD [facility]  If 7PM-7AM, please contact night-coverage [email or web address] Password [facility] 01/15/2018, 11:17 AM

## 2018-01-15 NOTE — Care Management Note (Signed)
Case Management Note  Patient Details  Name: Tracy Flynn MRN: [id number] Date of Birth: Dec 21, 1955  Subjective/Objective:    From home presents with recurrent pleural effusion, likely malignant. She also has ascites and anemia. Oncology consulted for ovarian.  She is established with the [facility] and wellness clinic.  She may need medication ast if dc over the weekend.                   Action/Plan: NCM will follow for transition of care needs.   Expected Discharge Date:  01/15/18               Expected Discharge Plan:  Home/Self Care  In-House Referral:     Discharge planning Services  CM Consult, [facility], Medication Assistance  Post Acute Care Choice:    Choice offered to:     DME Arranged:    DME Agency:     HH Arranged:    HH Agency:     Status of Service:  In process, will continue to follow  If discussed at Long Length of Stay Meetings, dates discussed:    Additional Comments:  Zenon Mayo, RN 01/15/2018, 12:00 PM

## 2018-01-15 NOTE — Progress Notes (Signed)
Palliative:  I had another good discussion today with Tracy Flynn. Tracy Flynn is still undecided as to what she wants and her Tracy Flynn. She clearly shares with me that people believe that she is in denial about her cancer and prognosis but she says that she is not in denial but has just found peace. She says she is not afraid to die but worries about her family.   We spent much of our discussion today regarding to her spiritual beliefs and needs. She shares her beliefs and tells me that she needs time to clear all the discussion and talk out of her head so she can connect on a spiritual level to find clarity for what she is supposed to do.   Tracy Flynn is aware of her options and the consequences of her decisions. We have discussed aggressive care with chemotherapy as well as hospice, comfort, code status, and drains placed to better manage her comfort. I gave her Hard Choices booklet. Tracy Flynn indicates in discussion that she would not want resuscitation or to prolong dying but does not give a direct answer regarding code status. She talks of how selfish she was when her father died when it was clearly his time and what he desired. She also talks about the peaceful (and quick) death of her friend.   Offered outpatient palliative to follow but she is undecided. She agrees for referral and to speak with someone for potential palliative visit.   34 min  Vinie Sill, NP Palliative Medicine Team Pager # [phone] (M-F 8a-5p) Team Phone # [phone] (Nights/Weekends)

## 2018-01-15 NOTE — Procedures (Signed)
PROCEDURE SUMMARY:  Successful image-guided paracentesis from the left abdomen.  Yielded 4.0 liters of chylous fluid.  No immediate complications.  Patient tolerated well.   Specimen was not sent for labs.  Joaquim Nam PA-C 01/15/2018 11:11 AM

## 2018-01-16 LAB — BASIC METABOLIC PANEL
Anion gap: 7 (ref 5–15)
BUN: 12 mg/dL (ref 8–23)
CALCIUM: 8.1 mg/dL — AB (ref 8.9–10.3)
CO2: 27 mmol/L (ref 22–32)
Chloride: 103 mmol/L (ref 98–111)
Creatinine, Ser: 0.65 mg/dL (ref 0.44–1.00)
GFR calc non Af Amer: >60 mL/min (ref >60)
Glucose, Bld: 106 mg/dL — ABNORMAL HIGH (ref 70–99)
Potassium: 3.8 mmol/L (ref 3.5–5.1)
Sodium: 137 mmol/L (ref 135–145)

## 2018-01-16 LAB — CBC
HEMATOCRIT: 28.3 % — AB (ref 36.0–46.0)
Hemoglobin: 8.4 g/dL — ABNORMAL LOW (ref 12.0–15.0)
MCH: 21.6 pg — AB (ref 26.0–34.0)
MCHC: 29.7 g/dL — ABNORMAL LOW (ref 30.0–36.0)
MCV: 72.8 fL — ABNORMAL LOW (ref 78.0–100.0)
Platelets: 556 10*3/uL — ABNORMAL HIGH (ref 150–400)
RBC: 3.89 MIL/uL (ref 3.87–5.11)
RDW: 19.8 % — AB (ref 11.5–15.5)
WBC: 14 10*3/uL — ABNORMAL HIGH (ref 4.0–10.5)

## 2018-01-16 MED ORDER — FOLIC ACID 1 MG PO TABS: 1.0000 mg | ORAL_TABLET | Freq: Every day | ORAL | Status: AC

## 2018-01-16 MED ORDER — FERROUS SULFATE 325 (65 FE) MG PO TABS: 325.0000 mg | ORAL_TABLET | Freq: Two times a day (BID) | ORAL | 3 refills | Status: DC

## 2018-01-16 MED ORDER — ONDANSETRON HCL 4 MG PO TABS: 4.0000 mg | ORAL_TABLET | Freq: Four times a day (QID) | ORAL | 0 refills | Status: DC | PRN

## 2018-01-16 MED ORDER — CYANOCOBALAMIN 100 MCG PO TABS: 100.0000 ug | ORAL_TABLET | Freq: Every day | ORAL | Status: DC

## 2018-01-16 MED ORDER — POTASSIUM CHLORIDE ER 10 MEQ PO TBCR: 10.0000 meq | EXTENDED_RELEASE_TABLET | Freq: Every day | ORAL | 0 refills | Status: DC

## 2018-01-16 NOTE — Discharge Summary (Signed)
Physician Discharge Summary  Tracy Flynn SAY:[id number] DOB: Aug 22, 1955 DOA: 01/12/2018  PCP: Gildardo Pounds, NP  Admit date: 01/12/2018 Discharge date: 01/16/2018  Admitted From: Home Disposition: Home Recommendations for Outpatient Follow-up:  1. Follow up with PCP in 1-2 weeks 2. Please obtain BMP/CBC in one week 3. Follow-up with oncology Dr. Alvy Bimler  Home Health none Equipment/Devices none Discharge Condition stable CODE STATUS full code Diet recommendation: Low-salt diet Brief/Interim Summary:62 y.o.femalewithhistory of recent admission in June 1 week 2 months ago for shortness of breath at that time patient was found to have large left pleural effusion which patient had thoracentesis done at the time work-up also showed features concerning for ovarian neoplasm with metastasis. Patient has not followed up with her gynecologist. Has an appointment on August 26 next month. As patient comes to [place] with complaints of worsening shortness of breath which is acutely worsened over the last 24 hours. Denies any chest pain or productive cough fever or chills.  ED Course:In the ER x-rays showed large left pleural effusion. Patient also has diffuse edema all over both lower extremities and abdomen. Patient was recently placed on Lasix by primary care physician over the last 1 month which patient decreased the dose to 20 over the last 2 weeks. Admitted with recurrent pleural effusion, likely malignant. She also has ascites and anemia. 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 Underwent thoracentesis 7-24, 1.8 L removed Oncology consulted patient refused any aggressive intervention at this time patient will follow-up with Dr. Simeon Craft such upon discharge.  Ovarian tumor, Metastatic cancer;Primary GYN malignancy possible right ovarian cancer/ascites- 11-26-2017; CT abdomen; large pelvis mass, concerning with ovarian cancer. Ascites , multiple hepatic lesions.  11-28-2017;Pelvic U/S was ordered and it appeared she had Endometrial Thickening concerning for malignancy Unclear if she ever follow up with GYN oncology.  Dr Alvy Bimler, consulted. Patient was suppose to follow with oncology.  Appreciate Dr Alvy Bimler help. Patient declining chemotherapy. Palliative consulted. paracentesis done 01/15/2018 yielded 4 L of chylous fluid.  Specimen was not sent for any labs.  Patient tolerated procedure well. Patient had multiple consult discussions with palliative care.  She was still unsure of what she wants to do next.  However she did not want chemotherapy at this time.  She did report that she does not want resuscitation or prolonged dying but did not commit to DNR status either.  Mild elevation of troponin.  ECHO.EF 50 % Conservative management  Iron deficiency anemia;anemia, symptomatic tachycardia. folate deficiency. Startedsupplement.  Low normal B 12. Startedsupplement.  Transfused one unit PRBC   HYPOKALEMIA REPLETED      Discharge Instructions  Discharge Instructions    Call MD for:  difficulty breathing, headache or visual disturbances   Complete by:  As directed    Call MD for:  extreme fatigue   Complete by:  As directed    Call MD for:  persistant dizziness or light-headedness   Complete by:  As directed    Call MD for:  persistant nausea and vomiting   Complete by:  As directed    Call MD for:  redness, tenderness, or signs of infection (pain, swelling,  redness, odor or green/yellow discharge around incision site)   Complete by:  As directed    Call MD for:  severe uncontrolled pain   Complete by:  As directed    Diet - low sodium heart healthy   Complete by:  As directed    Increase activity slowly   Complete by:  As directed      Allergies as of 01/16/2018      Reactions   Codeine Other (See Comments)   Unknown   Penicillins    Has patient had a PCN reaction causing immediate rash, facial/tongue/throat swelling, SOB or lightheadedness with hypotension: {Yes Has patient had a PCN reaction causing severe rash involving mucus membranes or skin necrosis:No Has patient had a PCN reaction that required hospitalization: No Has patient had a PCN reaction occurring within the last 10 years: No If all of the above answers are "NO", then may proceed with Cephalosporin use.      Medication List    STOP taking these medications   ipratropium-albuterol 0.5-2.5 (3) MG/3ML Soln Commonly known as:  DUONEB     TAKE these medications   cetirizine 10 MG tablet Commonly known as:  ZYRTEC Take 1 tablet (10 mg total) by mouth daily.   cyanocobalamin 100 MCG tablet Take 1 tablet (100 mcg total) by mouth daily. Start taking on:  01/17/2018   ferrous sulfate 325 (65 FE) MG tablet Take 1 tablet (325 mg total) by mouth 2 (two) times daily with a meal.   folic acid 1 MG tablet Commonly known as:  FOLVITE Take 1 tablet (1 mg total) by mouth daily. Start taking on:  01/17/2018   furosemide 20 MG tablet Commonly known as:  LASIX Take 20 mg by mouth daily.   ondansetron 4 MG tablet Commonly known as:  ZOFRAN Take 1 tablet (4 mg total) by mouth every 6 (six) hours as needed for nausea.   potassium chloride 10 MEQ tablet Commonly known as:  K-DUR Take 1 tablet (10 mEq total) by mouth daily.      Follow-up [facility].   Contact information: [place] [address] [phone]       Gildardo Pounds, NP Follow up.   Specialty:  Nurse Practitioner Contact information: [address] [place] [address] [phone]        Heath Lark, MD Follow up.   Specialty:  Hematology and Oncology Contact information: [place] [address] [phone]          Allergies  Allergen Reactions  . Codeine Other (See Comments)    Unknown  . Penicillins     Has patient had a PCN reaction causing immediate rash, facial/tongue/throat swelling, SOB or lightheadedness with hypotension: {Yes Has patient had a PCN reaction causing severe rash involving mucus membranes or skin necrosis:No Has patient had a PCN reaction that required hospitalization: No Has patient had a PCN reaction occurring within the last 10 years: No If all of the above answers are "NO", then may proceed with Cephalosporin use.     Consultations:  Dr. Alvy Bimler from oncology, interventional radiology   Procedures/Studies: Dg Chest 1 View  Result Date: 01/13/2018 CLINICAL DATA:  Post thoracentesis. EXAM: CHEST  1 VIEW COMPARISON:  Chest x-ray dated 01/12/2018. FINDINGS: Improved aeration of the LEFT lung  status post thoracentesis today. Persistent effusion and/or airspace collapse within the LEFT mid and lower lung zones. No pneumothorax seen. RIGHT lung remains clear. IMPRESSION: Improved aeration of the LEFT lung status post thoracentesis. No pneumothorax seen. Electronically Signed   By: Franki Cabot M.D.   On: 01/13/2018 14:25   Dg Chest 1 View  Result Date: 12/29/2017 CLINICAL DATA:  Status post left thoracentesis EXAM: CHEST  1 VIEW COMPARISON:  12/28/2017 FINDINGS: Small-moderate left pleural effusion. Left lower lobe atelectasis. Trace right pleural effusion. No pneumothorax. Stable cardiomediastinal silhouette. No acute osseous abnormality. IMPRESSION: Small-moderate left pleural effusion with atelectasis. No left  pneumothorax. Electronically Signed   By: Kathreen Devoid   On: 12/29/2017 12:11   Dg Chest 2 View  Result Date: 01/12/2018 CLINICAL DATA:  62 year old with shortness of breath. History of left thoracentesis. EXAM: CHEST - 2 VIEW COMPARISON:  12/29/2017 FINDINGS: Increased densities throughout the left hemithorax. Findings compatible with a very large left pleural effusion which has increased in size from the comparison examination. There is tracheal and mediastinal deviation towards the right and this has been present on previous examinations. Densities in the mid right chest may represent overlying structures. Probable small right pleural effusion. Limited evaluation of the cardiac silhouette. IMPRESSION: Increased left pleural fluid since 12/29/2017. Findings are compatible with a very large left pleural effusion. Electronically Signed   By: Markus Daft M.D.   On: 01/12/2018 18:08   Dg Chest 2 View  Result Date: 12/28/2017 CLINICAL DATA:  Shortness of breath, LEFT chest pain and cough. EXAM: CHEST - 2 VIEW COMPARISON:  Chest radiograph December 11, 2017 FINDINGS: Increased large LEFT pleural effusion with mediastinal shift to the RIGHT. Dense underlying consolidation. RIGHT lung is clear. The heart borders obscured on LEFT. No pneumothorax. Soft tissue planes and included osseous structures are non suspicious. Staple projecting LEFT neck is likely superficial to patient. IMPRESSION: Re-accumulation of large LEFT pleural effusion with mediastinal shift to the RIGHT. Underlying consolidation or mass is possible. Acute findings discussed with and reconfirmed by Dr.JOSHUA LONG on 12/28/2017 at 5:43 pm. Electronically Signed   By: Elon Alas M.D.   On: 12/28/2017 17:43   Ir Paracentesis  Result Date: 01/15/2018 INDICATION: Ascites due to likely metastatic ovarian cancer. Request for therapeutic paracentesis. EXAM: ULTRASOUND GUIDED THERAPEUTIC PARACENTESIS MEDICATIONS: 10 mL 2% lidocaine. COMPLICATIONS: None  immediate. PROCEDURE: Informed written consent was obtained from the patient after a discussion of the risks, benefits and alternatives to treatment. A timeout was performed prior to the initiation of the procedure. Initial ultrasound scanning demonstrates a large amount of ascites within the left lower abdominal quadrant. The left lower abdomen was prepped and draped in the usual sterile fashion. 2% lidocaine was used for local anesthesia. Following this, a 19 gauge, 7-cm, Yueh catheter was introduced. An ultrasound image was saved for documentation purposes. The paracentesis was performed. The catheter was removed and a dressing was applied. The patient tolerated the procedure well without immediate post procedural complication. FINDINGS: A total of approximately 4.0L of chylous fluid was removed. Samples were not sent to the laboratory as requested by the clinical team. IMPRESSION: Successful ultrasound-guided paracentesis yielding 4.0 liters of peritoneal fluid. Read by Candiss Norse, PA-C Electronically Signed   By: Aletta Edouard M.D.   On: 01/15/2018 11:14   Ir Thoracentesis Asp Pleural Space W/img Guide  Result Date: 01/13/2018 INDICATION: Shortness of breath; recurrent pleural effusion. CT abdomen/pelvis from 6/8 concerning for ovarian neoplasm with metastases to liver. Request  for diagnostic and therapeutic thoracentesis. EXAM: ULTRASOUND GUIDED LEFT THORACENTESIS MEDICATIONS: 10 mL 2% lidocaine. COMPLICATIONS: None immediate. PROCEDURE: An ultrasound guided thoracentesis was thoroughly discussed with the patient and questions answered. The benefits, risks, alternatives and complications were also discussed. The patient understands and wishes to proceed with the procedure. Written consent was obtained. Ultrasound was performed to localize and mark an adequate pocket of fluid in the left chest. The area was then prepped and draped in the normal sterile fashion. 2% Lidocaine was used for local  anesthesia. Under ultrasound guidance a 6 Fr Safe-T-Centesis catheter was introduced. Thoracentesis was performed. The catheter was removed and a dressing applied. FINDINGS: A total of approximately 1.8 L of serosanguineous fluid was removed. Samples were sent to the laboratory as requested by the clinical team. IMPRESSION: Successful ultrasound guided left thoracentesis yielding 1.8 L of pleural fluid. Read by Candiss Norse, PA-C Electronically Signed   By: Sandi Mariscal M.D.   On: 01/13/2018 14:33   Ir Thoracentesis Asp Pleural Space W/img Guide  Result Date: 12/29/2017 INDICATION: History of pelvic cystic mass concerning for ovarian neoplasm, ascites, liver lesions, and large left pleural effusion. Request for therapeutic thoracentesis. EXAM: ULTRASOUND GUIDED LEFT THORACENTESIS MEDICATIONS: 1% lidocaine 10 mL COMPLICATIONS: None immediate. PROCEDURE: An ultrasound guided thoracentesis was thoroughly discussed with the patient and questions answered. The benefits, risks, alternatives and complications were also discussed. The patient understands and wishes to proceed with the procedure. Written consent was obtained. Ultrasound was performed to localize and mark an adequate pocket of fluid in the left chest. The area was then prepped and draped in the normal sterile fashion. 1% Lidocaine was used for local anesthesia. Under ultrasound guidance a 6 Fr Safe-T-Centesis catheter was introduced. Thoracentesis was performed. The catheter was removed and a dressing applied. FINDINGS: A total of approximately 2.3 L of bloody fluid was removed. IMPRESSION: Successful ultrasound guided left thoracentesis yielding 2.3 L of pleural fluid. No pneumothorax on post procedure chest x-ray. Read by: Gareth Eagle, PA-C Electronically Signed   By: Corrie Mckusick D.O.   On: 12/29/2017 12:59    (Echo, Carotid, EGD, Colonoscopy, ERCP)    Subjective:   Discharge Exam: Vitals:   01/15/18 2344 01/16/18 0823  BP: 116/77  126/72  Pulse: (!) 103 99  Resp:    Temp: 98.7 F (37.1 C) 98.5 F (36.9 C)  SpO2: 100% 100%   Vitals:   01/15/18 1053 01/15/18 1648 01/15/18 2344 01/16/18 0823  BP: 134/77 132/72 116/77 126/72  Pulse:  (!) 103 (!) 103 99  Resp:  14    Temp:  98.6 F (37 C) 98.7 F (37.1 C) 98.5 F (36.9 C)  TempSrc:   Oral   SpO2:  100% 100% 100%  Weight:      Height:        General: Pt is alert, awake, not in acute distress Cardiovascular: RRR, S1/S2 +, no rubs, no gallops Respiratory: CTA bilaterally, no wheezing, no rhonchi Abdominal: Soft, NT, ND, bowel sounds + Extremities: no edema, no cyanosis    The results of significant diagnostics from this hospitalization (including imaging, microbiology, ancillary and laboratory) are listed below for reference.     Microbiology: Recent Results (from the past 240 hour(s))  Gram stain     Status: None   Collection Time: 01/13/18  1:50 PM  Result Value Ref Range Status   Specimen Description PLEURAL LEFT  Final   Special Requests NONE  Final   Gram Stain   Final  ABUNDANT WBC PRESENT,BOTH PMN AND MONONUCLEAR NO ORGANISMS SEEN Performed at [facility], [place] [address], [place], [place] [address]    Report Status 01/13/2018 FINAL  Final  Culture, body fluid-bottle     Status: None (Preliminary result)   Collection Time: 01/13/18  1:50 PM  Result Value Ref Range Status   Specimen Description PLEURAL LEFT  Final   Special Requests NONE  Final   Culture   Final    NO GROWTH 2 DAYS Performed at [facility], [place] [address], [place], [place] [address]    Report Status PENDING  Incomplete     Labs: BNP (last 3 results) Recent Labs    11/26/17 1921 12/28/17 1709 01/12/18 1722  BNP 26.7 34.5 27.7   Basic Metabolic Panel: Recent Labs  Lab 01/12/18 1722 01/12/18 2244 01/13/18 0348 01/14/18 0334 01/15/18 0324 01/16/18 0205  NA 139  --  140 140 139 137  K 4.2  --  4.0 3.5 3.4* 3.8  CL 104  --  107 106 105 103   CO2 26  --  26 26 27 27   GLUCOSE 80  --  140* 114* 106* 106*  BUN 20  --  20 16 12 12   CREATININE 0.77  --  0.81 0.66 0.59 0.65  CALCIUM 8.6*  --  8.4* 8.1* 8.2* 8.1*  MG  --  2.1  --   --   --   --    Liver Function Tests: Recent Labs  Lab 01/12/18 1722 01/14/18 1536  AST 17 17  ALT 10 10  ALKPHOS 95 89  BILITOT 0.4 0.5  PROT 8.3* 7.3  ALBUMIN 1.9* 1.6*   No results for input(s): LIPASE, AMYLASE in the last 168 hours. Recent Labs  Lab 01/14/18 1536  AMMONIA 30   CBC: Recent Labs  Lab 01/12/18 1722 01/13/18 0348 01/14/18 0334 01/15/18 0324 01/16/18 0205  WBC 11.5* 11.8* 10.7* 14.3* 14.0*  NEUTROABS 9.8*  --   --   --   --   HGB 8.6* 7.9* 7.3* 8.7* 8.4*  HCT 27.9* 26.9* 25.0* 28.9* 28.3*  MCV 69.8* 70.6* 71.0* 72.6* 72.8*  PLT 695* 618* 621* 586* 556*   Cardiac Enzymes: Recent Labs  Lab 01/12/18 1722 01/12/18 2244 01/13/18 0348 01/13/18 1041  TROPONINI 0.08* 0.07* 0.07* 0.07*   BNP: Invalid input(s): POCBNP CBG: No results for input(s): GLUCAP in the last 168 hours. D-Dimer No results for input(s): DDIMER in the last 72 hours. Hgb A1c No results for input(s): HGBA1C in the last 72 hours. Lipid Profile No results for input(s): CHOL, HDL, LDLCALC, TRIG, CHOLHDL, LDLDIRECT in the last 72 hours. Thyroid function studies No results for input(s): TSH, T4TOTAL, T3FREE, THYROIDAB in the last 72 hours.  Invalid input(s): FREET3 Anemia work up No results for input(s): VITAMINB12, FOLATE, FERRITIN, TIBC, IRON, RETICCTPCT in the last 72 hours. Urinalysis    Component Value Date/Time   COLORURINE YELLOW 11/26/2017 1906   APPEARANCEUR CLEAR 11/26/2017 1906   LABSPEC 1.020 11/26/2017 1906   PHURINE 5.5 11/26/2017 1906   GLUCOSEU NEGATIVE 11/26/2017 1906   HGBUR MODERATE (A) 11/26/2017 [place] NEGATIVE 11/26/2017 1906   KETONESUR 15 (A) 11/26/2017 1906   PROTEINUR NEGATIVE 11/26/2017 1906   NITRITE NEGATIVE 11/26/2017 1906   LEUKOCYTESUR  NEGATIVE 11/26/2017 1906   Sepsis Labs Invalid input(s): PROCALCITONIN,  WBC,  LACTICIDVEN Microbiology Recent Results (from the past 240 hour(s))  Gram stain     Status: None  Collection Time: 01/13/18  1:50 PM  Result Value Ref Range Status   Specimen Description PLEURAL LEFT  Final   Special Requests NONE  Final   Gram Stain   Final    ABUNDANT WBC PRESENT,BOTH PMN AND MONONUCLEAR NO ORGANISMS SEEN Performed at [facility], [address], [place], [place] [address]    Report Status 01/13/2018 FINAL  Final  Culture, body fluid-bottle     Status: None (Preliminary result)   Collection Time: 01/13/18  1:50 PM  Result Value Ref Range Status   Specimen Description PLEURAL LEFT  Final   Special Requests NONE  Final   Culture   Final    NO GROWTH 2 DAYS Performed at [facility], [place] [address] [place], [place] [address]    Report Status PENDING  Incomplete     Time coordinating discharge: 34 minutes  SIGNED:   Georgette Shell, MD  [facility] 01/16/2018, 8:57 AM Pager   If 7PM-7AM, please contact night-coverage [email or web address] Password [email or web address]

## 2018-01-18 LAB — CULTURE, BODY FLUID W GRAM STAIN -BOTTLE: Culture: NO GROWTH

## 2018-01-18 LAB — CULTURE, BODY FLUID-BOTTLE

## 2018-01-29 ENCOUNTER — Emergency Department (HOSPITAL_COMMUNITY): Payer: Self-pay

## 2018-01-29 ENCOUNTER — Emergency Department (HOSPITAL_BASED_OUTPATIENT_CLINIC_OR_DEPARTMENT_OTHER)
Admission: EM | Admit: 2018-01-29 | Discharge: 2018-01-29 | Disposition: A | Discharge status: Home / Self Care | Payer: Self-pay | Attending: Emergency Medicine | Admitting: Emergency Medicine

## 2018-01-29 ENCOUNTER — Emergency Department (HOSPITAL_BASED_OUTPATIENT_CLINIC_OR_DEPARTMENT_OTHER): Payer: Self-pay

## 2018-01-29 ENCOUNTER — Other Ambulatory Visit: Payer: Self-pay

## 2018-01-29 ENCOUNTER — Encounter (HOSPITAL_BASED_OUTPATIENT_CLINIC_OR_DEPARTMENT_OTHER): Payer: Self-pay | Admitting: *Deleted

## 2018-01-29 DIAGNOSIS — J90 Pleural effusion, not elsewhere classified: Secondary | ICD-10-CM

## 2018-01-29 DIAGNOSIS — Z9889 Other specified postprocedural states: Secondary | ICD-10-CM

## 2018-01-29 DIAGNOSIS — R0600 Dyspnea, unspecified: Secondary | ICD-10-CM

## 2018-01-29 DIAGNOSIS — Z87891 Personal history of nicotine dependence: Secondary | ICD-10-CM | POA: Insufficient documentation

## 2018-01-29 DIAGNOSIS — C569 Malignant neoplasm of unspecified ovary: Secondary | ICD-10-CM | POA: Insufficient documentation

## 2018-01-29 DIAGNOSIS — Z79899 Other long term (current) drug therapy: Secondary | ICD-10-CM | POA: Insufficient documentation

## 2018-01-29 DIAGNOSIS — R0602 Shortness of breath: Secondary | ICD-10-CM

## 2018-01-29 LAB — CBC WITH DIFFERENTIAL/PLATELET
BASOS ABS: 0 10*3/uL (ref 0.0–0.1)
Basophils Relative: 0 %
EOS PCT: 0 %
Eosinophils Absolute: 0.1 10*3/uL (ref 0.0–0.7)
HCT: 31.8 % — ABNORMAL LOW (ref 36.0–46.0)
Hemoglobin: 9.8 g/dL — ABNORMAL LOW (ref 12.0–15.0)
Lymphocytes Relative: 5 %
Lymphs Abs: 0.7 10*3/uL (ref 0.7–4.0)
MCH: 22.5 pg — AB (ref 26.0–34.0)
MCHC: 30.8 g/dL (ref 30.0–36.0)
MCV: 73.1 fL — AB (ref 78.0–100.0)
MONO ABS: 1.3 10*3/uL — AB (ref 0.1–1.0)
MONOS PCT: 10 %
Neutro Abs: 11.8 10*3/uL — ABNORMAL HIGH (ref 1.7–7.7)
Neutrophils Relative %: 85 %
Platelets: 757 10*3/uL — ABNORMAL HIGH (ref 150–400)
RBC: 4.35 MIL/uL (ref 3.87–5.11)
RDW: 22.7 % — ABNORMAL HIGH (ref 11.5–15.5)
WBC: 14 10*3/uL — ABNORMAL HIGH (ref 4.0–10.5)

## 2018-01-29 LAB — COMPREHENSIVE METABOLIC PANEL
ALBUMIN: 2 g/dL — AB (ref 3.5–5.0)
ALK PHOS: 129 U/L — AB (ref 38–126)
ALT: 11 U/L (ref 0–44)
AST: 16 U/L (ref 15–41)
Anion gap: 9 (ref 5–15)
BILIRUBIN TOTAL: 0.4 mg/dL (ref 0.3–1.2)
BUN: 22 mg/dL (ref 8–23)
CALCIUM: 8.6 mg/dL — AB (ref 8.9–10.3)
CO2: 26 mmol/L (ref 22–32)
Chloride: 104 mmol/L (ref 98–111)
Creatinine, Ser: 0.86 mg/dL (ref 0.44–1.00)
GFR calc Af Amer: >60 mL/min (ref >60)
GFR calc non Af Amer: >60 mL/min (ref >60)
GLUCOSE: 107 mg/dL — AB (ref 70–99)
Potassium: 4.1 mmol/L (ref 3.5–5.1)
SODIUM: 139 mmol/L (ref 135–145)
TOTAL PROTEIN: 8.3 g/dL — AB (ref 6.5–8.1)

## 2018-01-29 LAB — PROTIME-INR
INR: 1.1
PROTHROMBIN TIME: 14.2 s (ref 11.4–15.2)

## 2018-01-29 LAB — TROPONIN I: Troponin I: 0.07 ng/mL (ref <0.03)

## 2018-01-29 MED ORDER — LIDOCAINE HCL 1 % IJ SOLN
INTRAMUSCULAR | Status: AC
  Filled 2018-01-29: qty 10

## 2018-01-29 NOTE — ED Provider Notes (Signed)
[place] DEPT Provider Note   CSN: [id number] Arrival date & time: 01/29/18  1111     History   Chief Complaint Chief Complaint  Patient presents with  . Shortness of Breath    HPI Tracy Flynn is a 62 y.o. female.  HPI   She is here for evaluation of shortness of breath and abdominal swelling.  Symptoms are ongoing and recurrent.  She was evaluated at [facility] today and sent here for treatment by IR to relieve her symptoms.  Last IR procedure was thoracentesis about 10 days ago.  She denies fever, chills, nausea, vomiting, cough, focal weakness or paresthesia.  She has mild abdominal pain.  She has metastatic ovarian cancer which is not currently being treated with chemotherapy or other active interventions.  Patient has been seen by palliative care, she is not specifically a DNR patient but she does not want to have resuscitation or a prolonging death, according to prior notes.  There are no other known modifying factors.  Past Medical History:  Diagnosis Date  . Pleural effusion   . Uterine fibroid     Patient Active Problem List   Diagnosis Date Noted  . Cancer, metastatic ([place])   . Goals of care, counseling/discussion   . Palliative care encounter   . Recurrent pleural effusion on left 01/13/2018  . Anasarca 01/12/2018  . Severe protein-calorie malnutrition ([place]) 01/12/2018  . Normochromic normocytic anemia 01/12/2018  . Cyst of ovary   . 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 Mesenteric lymphadenopathy 11/27/2017  . Liver mass 11/27/2017  . Sinus tachycardia 11/27/2017  . Pleural effusion on left 11/27/2017  . Pleural effusion 11/27/2017  . Anemia 11/27/2017  . Thrombocytosis ([place]) 11/27/2017    Past Surgical History:  Procedure Laterality Date  . HERNIA REPAIR    . IR PARACENTESIS  01/15/2018  . IR THORACENTESIS ASP PLEURAL SPACE W/IMG GUIDE  12/11/2017  . IR THORACENTESIS ASP PLEURAL SPACE W/IMG GUIDE  12/29/2017  . IR  THORACENTESIS ASP PLEURAL SPACE W/IMG GUIDE  01/13/2018     OB History   None      Home Medications    Prior to Admission medications   Medication Sig Start Date End Date Taking? Authorizing Provider  cetirizine (ZYRTEC) 10 MG tablet Take 1 tablet (10 mg total) by mouth daily. 12/10/17  Yes Brayton Caves, PA-C  ferrous sulfate 325 (65 FE) MG tablet Take 1 tablet (325 mg total) by mouth 2 (two) times daily with a meal. 01/16/18  Yes Georgette Shell, MD  folic acid (FOLVITE) 1 MG tablet Take 1 tablet (1 mg total) by mouth daily. 01/17/18  Yes Georgette Shell, MD  furosemide (LASIX) 20 MG tablet Take 20 mg by mouth daily.   Yes [provider]  potassium chloride (K-DUR) 10 MEQ tablet Take 1 tablet (10 mEq total) by mouth daily. 01/16/18  Yes Georgette Shell, MD  vitamin B-12 100 MCG tablet Take 1 tablet (100 mcg total) by mouth daily. 01/17/18  Yes Georgette Shell, MD  ondansetron (ZOFRAN) 4 MG tablet Take 1 tablet (4 mg total) by mouth every 6 (six) hours as needed for nausea. 01/16/18   Georgette Shell, MD    Family History Family History  Problem Relation Age of Onset  . Cancer Father     Social History Social History   Tobacco Use  . Smoking status: Former Research scientist (life sciences)  . Smokeless tobacco: Never Used  Substance Use Topics  .  Alcohol use: Never    Frequency: Never  . Drug use: Never     Allergies   Codeine and Penicillins   Review of Systems Review of Systems  All other systems reviewed and are negative.    Physical Exam Updated Vital Signs BP 133/90 (BP Location: Right Arm)   Pulse 100   Temp 97.6 F (36.4 C) (Oral)   Resp 17   Ht 5\' 11"  (1.803 m)   Wt 79.9 kg   SpO2 100%   BMI 24.57 kg/m   Physical Exam  Constitutional: She is oriented to person, place, and time. She appears well-developed and well-nourished.  HENT:  Head: Normocephalic and atraumatic.  Eyes: Pupils are equal, round, and reactive to light. Conjunctivae and  EOM are normal.  Neck: Normal range of motion and phonation normal. Neck supple.  Cardiovascular: Normal rate and regular rhythm.  Pulmonary/Chest: Effort normal and breath sounds normal. She exhibits no tenderness.  Abdominal: Soft. She exhibits no distension. There is no tenderness. There is no guarding.  Musculoskeletal: Normal range of motion.  Neurological: She is alert and oriented to person, place, and time. She exhibits normal muscle tone.  Skin: Skin is warm and dry.  Psychiatric: She has a normal mood and affect. Her behavior is normal. Judgment and thought content normal.  Nursing note and vitals reviewed.    ED Treatments / Results  Labs (all labs ordered are listed, but only abnormal results are displayed) Labs Reviewed  COMPREHENSIVE METABOLIC PANEL - Abnormal; Notable for the following components:      Result Value   Glucose, Bld 107 (*)    Calcium 8.6 (*)    Total Protein 8.3 (*)    Albumin 2.0 (*)    Alkaline Phosphatase 129 (*)    All other components within normal limits  CBC WITH DIFFERENTIAL/PLATELET - Abnormal; Notable for the following components:   WBC 14.0 (*)    Hemoglobin 9.8 (*)    HCT 31.8 (*)    MCV 73.1 (*)    MCH 22.5 (*)    RDW 22.7 (*)    Platelets 757 (*)    Neutro Abs 11.8 (*)    Monocytes Absolute 1.3 (*)    All other components within normal limits  TROPONIN I - Abnormal; Notable for the following components:   Troponin I 0.07 (*)    All other components within normal limits  PROTIME-INR   WBC  Date Value Ref Range Status  01/29/2018 14.0 (H) 4.0 - 10.5 K/uL Final  01/16/2018 14.0 (H) 4.0 - 10.5 K/uL Final  01/15/2018 14.3 (H) 4.0 - 10.5 K/uL Final  01/14/2018 10.7 (H) 4.0 - 10.5 K/uL Final    EKG EKG Interpretation  Date/Time:  Friday January 29 2018 13:00:32 EDT Ventricular Rate:  108 PR Interval:    QRS Duration: 81 QT Interval:  331 QTC Calculation: 444 R Axis:   5 Text Interpretation:  Sinus tachycardia Probable left  atrial enlargement Artifact in lead(s) I II III aVR aVL aVF Confirmed by Quintella Reichert [phone]) on 01/29/2018 1:02:56 PM   Radiology Dg Chest 1 View  Result Date: 01/29/2018 CLINICAL DATA:  Status post left thoracentesis. EXAM: CHEST  1 VIEW COMPARISON:  One-view chest x-ray 01/29/2018 at 12:40 p.m. FINDINGS: The left pleural effusion has decreased since the prior exam. A large left pleural effusion remains. No pneumothorax is present. Left lower lobe opacity remains. Small right pleural effusion is noted. IMPRESSION: 1. Slight decrease in left pleural effusion without pneumothorax  following thoracentesis. 2. Diffuse left-sided airspace disease remains concerning for pneumonia. 3. Small right pleural effusion. Electronically Signed   By: San Morelle M.D.   On: 01/29/2018 17:57   Dg Chest Port 1 View  Result Date: 01/29/2018 CLINICAL DATA:  Shortness of breath EXAM: PORTABLE CHEST 1 VIEW COMPARISON:  01/13/2018 and earlier FINDINGS: Large left pleural effusion, reaching the apex. The appearance is similar to 01/12/2018 and increased from a post thoracentesis radiograph 01/13/2018. The right lung is clear. The heart is largely obscured. IMPRESSION: Recurrent left pleural effusion with large volume, as large as seen on the 01/12/2018 exam. Electronically Signed   By: Monte Fantasia M.D.   On: 01/29/2018 13:19   US Thoracentesis Asp Pleural Space W/img Guide  Result Date: 01/29/2018 INDICATION: Patient with history of metastatic ovarian cancer, dyspnea, recurrent left pleural effusion. Request made for therapeutic left thoracentesis. EXAM: ULTRASOUND GUIDED THERAPEUTIC LEFT THORACENTESIS MEDICATIONS: None COMPLICATIONS: None immediate. PROCEDURE: An ultrasound guided thoracentesis was thoroughly discussed with the patient and questions answered. The benefits, risks, alternatives and complications were also discussed. The patient understands and wishes to proceed with the procedure. Written consent  was obtained. Ultrasound was performed to localize and mark an adequate pocket of fluid in the left chest. The area was then prepped and draped in the normal sterile fashion. 1% Lidocaine was used for local anesthesia. Under ultrasound guidance a 6 Fr Safe-T-Centesis catheter was introduced. Thoracentesis was performed. The catheter was removed and a dressing applied. FINDINGS: A total of approximately 1.6 liters of dark, bloody fluid was removed. IMPRESSION: Successful ultrasound guided therapeutic left thoracentesis yielding 1.6 liters of pleural fluid. Read by: Rowe Robert, PA-C Electronically Signed   By: Lucrezia Europe M.D.   On: 01/29/2018 17:27    Procedures Procedures (including critical care time)  Medications Ordered in ED Medications  lidocaine (XYLOCAINE) 1 % (with pres) injection (has no administration in time range)     Initial Impression / Assessment and Plan / ED Course  I have reviewed the triage vital signs and the nursing notes.  Pertinent labs & imaging results that were available during my care of the patient were reviewed by me and considered in my medical decision making (see chart for details).  Clinical Course as of Jan 29 1909  Fri Jan 29, 2018  1628 US THORACENTESIS ASP PLEURAL SPACE W/IMG GUIDE [EW]  7169 Case discussed with interventional list, who was able to extract 1/2 L of somewhat bloody fluid from her left chest, without complications.  Fluid was discarded, it will not be sent for testing.   [EW]    Clinical Course User Index [EW] Daleen Bo, MD     Patient Vitals for the past 24 hrs:  BP Temp Temp src Pulse Resp SpO2 Height Weight  01/29/18 1843 133/90 97.6 F (36.4 C) Oral 100 17 100 % - -  01/29/18 1716 (!) 141/82 - - - - - - -  01/29/18 1710 (!) 149/93 - - - - - - -  01/29/18 1705 135/77 - - - - - - -  01/29/18 1659 127/84 - - - - - - -  01/29/18 1430 131/81 - - 100 20 100 % - -  01/29/18 1319 - - - - - 98 % - -  01/29/18 1317 125/72 - -  (!) 103 (!) 28 100 % - -  01/29/18 1132 (!) 147/89 98.1 F (36.7 C) Oral (!) 116 20 99 % - -  01/29/18 1130 - - - - - - -  79.9 kg  01/29/18 1129 - - - - - - 5\' 11"  (1.803 m) -    6:31 PM Reevaluation with update and discussion. After initial assessment and treatment, an updated evaluation reveals patient is comfortable with no respiratory distress.  She denies chest pain or dizziness.  Nursing asked to repeat vital signs prior to discharge.  Findings discussed and questions answered. Daleen Bo   Medical Decision Making: Ovarian cancer, metastatic, with pleural effusion right ascites.  No acute respiratory distress or suspected infection.  Patient is choosing to not actively treat ovarian cancer.  She has a follow-up with her PCP and a limited 10 days and do not gynecology in about 2 weeks.  No indication for further intervention in the ED or hospitalization, at this time.  CRITICAL CARE- yes Performed by: Daleen Bo   Nursing Notes Reviewed/ Care Coordinated Applicable Imaging Reviewed Interpretation of Laboratory Data incorporated into ED treatment  The patient appears reasonably screened and/or stabilized for discharge and I doubt any other medical condition or other [facility] requiring further screening, evaluation, or treatment in the ED at this time prior to discharge.  Plan: Home Medications-continue usual medications; Home Treatments-rest, fluids, gradually advance diet and activity.; return here if the recommended treatment, does not improve the symptoms; Recommended follow up-follow-up with PCP and GYN as scheduled.  Return here, if needed, for problems.  Consider seeing oncology for reevaluation and treatment.     Final Clinical Impressions(s) / ED Diagnoses   Final diagnoses:  Pleural effusion  Shortness of breath  Dyspnea, unspecified type    ED Discharge Orders    None       Daleen Bo, MD 01/29/18 1911

## 2018-01-29 NOTE — ED Notes (Signed)
Pt taken to [place] for Thoracentesis

## 2018-01-29 NOTE — ED Provider Notes (Addendum)
[place] EMERGENCY DEPARTMENT Provider Note   CSN: [id number] Arrival date & time: 01/29/18  1111     History   Chief Complaint Chief Complaint  Patient presents with  . Shortness of Breath    HPI Tracy Flynn is a 62 y.o. female.  The history is provided by the patient. No language interpreter was used.  Shortness of Breath    Tracy Flynn is a 62 y.o. female who presents to the Emergency Department complaining of sob. Presents to the emergency department for one week of progressive shortness of breath. She has a history of metastatic ovarian cancer and has experienced problems with pleural effusions as well as ascites that require drainage. She has not had outpatient follow-up with oncology and has declined chemotherapy. She comes in today for one week of progressive symptoms, severe today. She denies any fevers, chest pain, vomiting. She has leg edema for the last two months, unchanged from baseline. She is unsure what her goals of care are or what treatments she is willing to undergo at this time. Past Medical History:  Diagnosis Date  . Pleural effusion   . Uterine fibroid     Patient Active Problem List   Diagnosis Date Noted  . Cancer, metastatic ([place])   . Goals of care, counseling/discussion   . Palliative care encounter   . Recurrent pleural effusion on left 01/13/2018  . Anasarca 01/12/2018  . Severe protein-calorie malnutrition ([place]) 01/12/2018  . Normochromic normocytic anemia 01/12/2018  . Cyst of ovary   . 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 Mesenteric lymphadenopathy 11/27/2017  . Liver mass 11/27/2017  . Sinus tachycardia 11/27/2017  . Pleural effusion on left 11/27/2017  . Pleural effusion 11/27/2017  . Anemia 11/27/2017  . Thrombocytosis ([place]) 11/27/2017    Past Surgical History:  Procedure Laterality Date  . HERNIA REPAIR    . IR PARACENTESIS  01/15/2018  . IR THORACENTESIS ASP PLEURAL SPACE W/IMG GUIDE  12/11/2017  . IR THORACENTESIS ASP  PLEURAL SPACE W/IMG GUIDE  12/29/2017  . IR THORACENTESIS ASP PLEURAL SPACE W/IMG GUIDE  01/13/2018     OB History   None      Home Medications    Prior to Admission medications   Medication Sig Start Date End Date Taking? Authorizing Provider  cetirizine (ZYRTEC) 10 MG tablet Take 1 tablet (10 mg total) by mouth daily. 12/10/17  Yes Brayton Caves, PA-C  ferrous sulfate 325 (65 FE) MG tablet Take 1 tablet (325 mg total) by mouth 2 (two) times daily with a meal. 01/16/18  Yes Georgette Shell, MD  folic acid (FOLVITE) 1 MG tablet Take 1 tablet (1 mg total) by mouth daily. 01/17/18  Yes Georgette Shell, MD  furosemide (LASIX) 20 MG tablet Take 20 mg by mouth daily.   Yes [provider]  potassium chloride (K-DUR) 10 MEQ tablet Take 1 tablet (10 mEq total) by mouth daily. 01/16/18  Yes Georgette Shell, MD  vitamin B-12 100 MCG tablet Take 1 tablet (100 mcg total) by mouth daily. 01/17/18  Yes Georgette Shell, MD  ondansetron (ZOFRAN) 4 MG tablet Take 1 tablet (4 mg total) by mouth every 6 (six) hours as needed for nausea. 01/16/18   Georgette Shell, MD    Family History Family History  Problem Relation Age of Onset  . Cancer Father     Social History Social History   Tobacco Use  . Smoking status: Former Research scientist (life sciences)  . Smokeless tobacco: Never Used  Substance Use Topics  . Alcohol use: Never    Frequency: Never  . Drug use: Never     Allergies   Codeine and Penicillins   Review of Systems Review of Systems  Respiratory: Positive for shortness of breath.   All other systems reviewed and are negative.    Physical Exam Updated Vital Signs BP 125/72   Pulse (!) 103   Temp 98.1 F (36.7 C) (Oral)   Resp (!) 28   Ht 5\' 11"  (1.803 m)   Wt 79.9 kg   SpO2 98%   BMI 24.57 kg/m   Physical Exam  Constitutional: She is oriented to person, place, and time. She appears well-developed.  Cachectic  HENT:  Head: Normocephalic and atraumatic.    Cardiovascular: Regular rhythm.  No murmur heard. Tachycardic  Pulmonary/Chest: Effort normal. No respiratory distress.  Tachypnea, decreased air movement in left lung fields  Abdominal: There is no tenderness. There is no rebound and no guarding.  Tense, distended abdomen without tenderness  Musculoskeletal: She exhibits no tenderness.  2+ pitting edema to BLE  Neurological: She is alert and oriented to person, place, and time.  Skin: Skin is warm and dry.  Psychiatric:  Flat affect.  Poor eye contact  Nursing note and vitals reviewed.    ED Treatments / Results  Labs (all labs ordered are listed, but only abnormal results are displayed) Labs Reviewed  COMPREHENSIVE METABOLIC PANEL - Abnormal; Notable for the following components:      Result Value   Glucose, Bld 107 (*)    Calcium 8.6 (*)    Total Protein 8.3 (*)    Albumin 2.0 (*)    Alkaline Phosphatase 129 (*)    All other components within normal limits  CBC WITH DIFFERENTIAL/PLATELET - Abnormal; Notable for the following components:   WBC 14.0 (*)    Hemoglobin 9.8 (*)    HCT 31.8 (*)    MCV 73.1 (*)    MCH 22.5 (*)    RDW 22.7 (*)    Platelets 757 (*)    Neutro Abs 11.8 (*)    Monocytes Absolute 1.3 (*)    All other components within normal limits  PROTIME-INR  TROPONIN I    EKG EKG Interpretation  Date/Time:  Friday January 29 2018 13:00:32 EDT Ventricular Rate:  108 PR Interval:    QRS Duration: 81 QT Interval:  331 QTC Calculation: 444 R Axis:   5 Text Interpretation:  Sinus tachycardia Probable left atrial enlargement Artifact in lead(s) I II III aVR aVL aVF Confirmed by Quintella Reichert [phone]) on 01/29/2018 1:02:56 PM   Radiology Dg Chest Port 1 View  Result Date: 01/29/2018 CLINICAL DATA:  Shortness of breath EXAM: PORTABLE CHEST 1 VIEW COMPARISON:  01/13/2018 and earlier FINDINGS: Large left pleural effusion, reaching the apex. The appearance is similar to 01/12/2018 and increased from a post  thoracentesis radiograph 01/13/2018. The right lung is clear. The heart is largely obscured. IMPRESSION: Recurrent left pleural effusion with large volume, as large as seen on the 01/12/2018 exam. Electronically Signed   By: Monte Fantasia M.D.   On: 01/29/2018 13:19    Procedures Procedures (including critical care time)  Medications Ordered in ED Medications - No data to display   Initial Impression / Assessment and Plan / ED Course  I have reviewed the triage vital signs and the nursing notes.  Pertinent labs & imaging results that were available during my care of the patient were reviewed by me and  considered in my medical decision making (see chart for details).  Clinical Course as of Jan 30 1441  Fri Jan 29, 2018  1628 US THORACENTESIS ASP PLEURAL SPACE W/IMG GUIDE [EW]  5747 Case discussed with interventional list, who was able to extract 1/2 L of somewhat bloody fluid from her left chest, without complications.  Fluid was discarded, it will not be sent for testing.   [EW]    Clinical Course User Index [EW] Daleen Bo, MD    Patient here for evaluation of shortness of breath. She has known metastatic cancer and has not had a full outpatient workup. She states she does not want chemotherapy. She does have tachycardia, tachypnea and a large recurrent pleural effusion as well as clinical large ascites on examination. Patient does not want admission to the hospital for further treatment. She has not had an outpatient follow-up with oncology at this point. She is clearly symptomatic from her pleural effusion and ascites and she is agreeable to drainage with thoracentesis and paracentesis. She does not agree to admission at this time, although admission is recommended at this time. Plan to transfer to the it was a long emergency department where she can have these procedures performed through interventional radiology. Discussed with Dr. Zenia Resides, who accepts the patient and  transfer.  Final Clinical Impressions(s) / ED Diagnoses   Final diagnoses:  Pleural effusion  Shortness of breath    ED Discharge Orders    None       Quintella Reichert, MD 01/29/18 1406    Quintella Reichert, MD 01/30/18 [phone]

## 2018-01-29 NOTE — Procedures (Signed)
Ultrasound-guided  therapeutic left thoracentesis performed yielding 1.6 liters of dark,bloody fluid. No immediate complications. Follow-up chest x-ray pending. Due to pt chest discomfort only the above amount of fluid was removed. The collection was multiloculated.

## 2018-01-29 NOTE — ED Notes (Signed)
Date and time results received: 01/29/18 1405 (use smartphrase ".now" to insert current time)  Test: Troponin Critical Value: 0.07  Name of Provider Notified: Ralene Bathe  Orders Received? Or Actions Taken?: Actions Taken: EDP made aware.

## 2018-01-29 NOTE — ED Notes (Signed)
Pt. Up to restroom.  Walked alone.

## 2018-01-29 NOTE — Discharge Instructions (Addendum)
Follow-up with your primary care doctor and gynecologist as scheduled.  His appointments are listed on this document.  See your other doctors as needed.

## 2018-01-29 NOTE — ED Triage Notes (Signed)
Sob for a few days. Same 2 weeks ago when she was admitted. She has ascites. States she does not have a hx of same.

## 2018-01-29 NOTE — ED Notes (Signed)
[facility] states no trucks available for transport in order for patient to get to [facility] to IR.  [facility] called for transport to [facility].

## 2018-02-04 ENCOUNTER — Other Ambulatory Visit: Payer: Self-pay

## 2018-02-04 ENCOUNTER — Emergency Department (HOSPITAL_COMMUNITY)
Admission: EM | Admit: 2018-02-04 | Discharge: 2018-02-04 | Disposition: A | Discharge status: Home / Self Care | Payer: Self-pay | Attending: Emergency Medicine | Admitting: Emergency Medicine

## 2018-02-04 ENCOUNTER — Emergency Department (HOSPITAL_COMMUNITY): Payer: Self-pay

## 2018-02-04 ENCOUNTER — Encounter (HOSPITAL_COMMUNITY): Payer: Self-pay

## 2018-02-04 DIAGNOSIS — Z79899 Other long term (current) drug therapy: Secondary | ICD-10-CM | POA: Insufficient documentation

## 2018-02-04 DIAGNOSIS — Z87891 Personal history of nicotine dependence: Secondary | ICD-10-CM | POA: Insufficient documentation

## 2018-02-04 DIAGNOSIS — Z8543 Personal history of malignant neoplasm of ovary: Secondary | ICD-10-CM | POA: Insufficient documentation

## 2018-02-04 DIAGNOSIS — R140 Abdominal distension (gaseous): Secondary | ICD-10-CM | POA: Insufficient documentation

## 2018-02-04 DIAGNOSIS — R180 Malignant ascites: Secondary | ICD-10-CM | POA: Insufficient documentation

## 2018-02-04 DIAGNOSIS — J918 Pleural effusion in other conditions classified elsewhere: Secondary | ICD-10-CM | POA: Insufficient documentation

## 2018-02-04 LAB — I-STAT CHEM 8, ED
BUN: 26 mg/dL — AB (ref 8–23)
CHLORIDE: 101 mmol/L (ref 98–111)
Calcium, Ion: 1.16 mmol/L (ref 1.15–1.40)
Creatinine, Ser: 0.8 mg/dL (ref 0.44–1.00)
Glucose, Bld: 100 mg/dL — ABNORMAL HIGH (ref 70–99)
HCT: 27 % — ABNORMAL LOW (ref 36.0–46.0)
Hemoglobin: 9.2 g/dL — ABNORMAL LOW (ref 12.0–15.0)
POTASSIUM: 4.7 mmol/L (ref 3.5–5.1)
SODIUM: 132 mmol/L — AB (ref 135–145)
TCO2: 24 mmol/L (ref 22–32)

## 2018-02-04 LAB — CBC WITH DIFFERENTIAL/PLATELET
Basophils Absolute: 0 10*3/uL (ref 0.0–0.1)
Basophils Relative: 0 %
Eosinophils Absolute: 0 10*3/uL (ref 0.0–0.7)
Eosinophils Relative: 0 %
HEMATOCRIT: 29.7 % — AB (ref 36.0–46.0)
HEMOGLOBIN: 9 g/dL — AB (ref 12.0–15.0)
LYMPHS PCT: 6 %
Lymphs Abs: 0.8 10*3/uL (ref 0.7–4.0)
MCH: 22.8 pg — AB (ref 26.0–34.0)
MCHC: 30.3 g/dL (ref 30.0–36.0)
MCV: 75.4 fL — AB (ref 78.0–100.0)
Monocytes Absolute: 1.3 10*3/uL — ABNORMAL HIGH (ref 0.1–1.0)
Monocytes Relative: 10 %
NEUTROS PCT: 84 %
Neutro Abs: 11.5 10*3/uL — ABNORMAL HIGH (ref 1.7–7.7)
Platelets: 818 10*3/uL — ABNORMAL HIGH (ref 150–400)
RBC: 3.94 MIL/uL (ref 3.87–5.11)
RDW: 21 % — ABNORMAL HIGH (ref 11.5–15.5)
WBC: 13.6 10*3/uL — AB (ref 4.0–10.5)

## 2018-02-04 MED ORDER — LIDOCAINE HCL 1 % IJ SOLN
INTRAMUSCULAR | Status: AC
  Filled 2018-02-04: qty 10

## 2018-02-04 MED ORDER — ALBUMIN HUMAN 5 % IV SOLN
12.5000 g | Freq: Once | INTRAVENOUS | Status: DC
  Filled 2018-02-04: qty 250

## 2018-02-04 NOTE — ED Notes (Signed)
Pt states she had fluid removed from her stomach 3 weeks ago when she was admitted to the hospital, but it has come back. Pt has a firm distended abdomen and complains that is uncomfortable.

## 2018-02-04 NOTE — ED Notes (Signed)
Pt returned from xray

## 2018-02-04 NOTE — ED Triage Notes (Signed)
Patient reports "fluid on the stomach". Patient states it has been a few weeks ago since she last had the fluid drained.

## 2018-02-04 NOTE — ED Notes (Signed)
Pt had drawn for labs:  Gold Blue Lavender Lt green  dk green x2 

## 2018-02-04 NOTE — ED Notes (Signed)
Pt to ultrasound for paracentesis.

## 2018-02-04 NOTE — ED Notes (Signed)
Pt returned from US

## 2018-02-04 NOTE — Procedures (Signed)
PROCEDURE SUMMARY:  Successful image-guided paracentesis from the left abdomen.  Yielded 8 liters of milky white fluid.  No immediate complications.  Patient tolerated well.   Specimen was not sent for labs.  Claris Pong Louk PA-C 02/04/2018 12:30 PM

## 2018-02-04 NOTE — ED Provider Notes (Signed)
[facility] DEPT Provider Note   CSN: [id number] Arrival date & time: 02/04/18  0841     History   Chief Complaint Chief Complaint  Patient presents with  . Bloated    HPI Kiffany Schelling is a 62 y.o. female.  Patient is a 62 year old female with a significant past medical history for metastatic ovarian cancer with recurrent left pleural effusion and abdominal ascites who is presenting today with request of fluid removal of her abdomen.  Patient states that her last paracentesis was approximately 3 weeks ago and they removed 3 to 4 L.  She had a thoracentesis done on Friday where they removed multiple liters as well.  Patient states her abdomen is just becoming so tight that it is very uncomfortable and she wishes to have the fluid taken off.  She denies any localized abdominal pain other than the tightness and has had no fever, nausea or vomiting.  She feels like her breathing is at baseline right now.  She is still been taking diuretics but has chosen to not undergo chemotherapy treatment and has seen palliative care.  The history is provided by the patient and a relative.    Past Medical History:  Diagnosis Date  . Pleural effusion   . Uterine fibroid     Patient Active Problem List   Diagnosis Date Noted  . Cancer, metastatic ([place])   . Goals of care, counseling/discussion   . Palliative care encounter   . Recurrent pleural effusion on left 01/13/2018  . Anasarca 01/12/2018  . Severe protein-calorie malnutrition ([place]) 01/12/2018  . Normochromic normocytic anemia 01/12/2018  . Cyst of ovary   . 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 Mesenteric lymphadenopathy 11/27/2017  . Liver mass 11/27/2017  . Sinus tachycardia 11/27/2017  . Pleural effusion on left 11/27/2017  . Pleural effusion 11/27/2017  . Anemia 11/27/2017  . Thrombocytosis ([place]) 11/27/2017    Past Surgical History:  Procedure Laterality Date  . HERNIA REPAIR    . IR PARACENTESIS   01/15/2018  . IR THORACENTESIS ASP PLEURAL SPACE W/IMG GUIDE  12/11/2017  . IR THORACENTESIS ASP PLEURAL SPACE W/IMG GUIDE  12/29/2017  . IR THORACENTESIS ASP PLEURAL SPACE W/IMG GUIDE  01/13/2018     OB History   None      Home Medications    Prior to Admission medications   Medication Sig Start Date End Date Taking? Authorizing Provider  cetirizine (ZYRTEC) 10 MG tablet Take 1 tablet (10 mg total) by mouth daily. 12/10/17   Brayton Caves, PA-C  ferrous sulfate 325 (65 FE) MG tablet Take 1 tablet (325 mg total) by mouth 2 (two) times daily with a meal. 01/16/18   Georgette Shell, MD  folic acid (FOLVITE) 1 MG tablet Take 1 tablet (1 mg total) by mouth daily. 01/17/18   Georgette Shell, MD  furosemide (LASIX) 20 MG tablet Take 20 mg by mouth daily.    [provider]  ondansetron (ZOFRAN) 4 MG tablet Take 1 tablet (4 mg total) by mouth every 6 (six) hours as needed for nausea. 01/16/18   Georgette Shell, MD  potassium chloride (K-DUR) 10 MEQ tablet Take 1 tablet (10 mEq total) by mouth daily. 01/16/18   Georgette Shell, MD  vitamin B-12 100 MCG tablet Take 1 tablet (100 mcg total) by mouth daily. 01/17/18   Georgette Shell, MD    Family History Family History  Problem Relation Age of Onset  . Cancer Father  Social History Social History   Tobacco Use  . Smoking status: Former Research scientist (life sciences)  . Smokeless tobacco: Never Used  Substance Use Topics  . Alcohol use: Never    Frequency: Never  . Drug use: Never     Allergies   Codeine and Penicillins   Review of Systems Review of Systems  All other systems reviewed and are negative.    Physical Exam Updated Vital Signs BP 130/84   Pulse 90   Temp 98.3 F (36.8 C) (Oral)   Resp 16   Ht 5\' 11"  (1.803 m)   Wt 77.1 kg   SpO2 93%   BMI 23.71 kg/m   Physical Exam  Constitutional: She is oriented to person, place, and time. She appears well-developed. She appears cachectic. She appears ill. No  distress.  HENT:  Head: Normocephalic and atraumatic.  Mouth/Throat: Oropharynx is clear and moist.  Eyes: Pupils are equal, round, and reactive to light. Conjunctivae and EOM are normal.  Neck: Normal range of motion. Neck supple.  Cardiovascular: Regular rhythm and intact distal pulses. Tachycardia present.  No murmur heard. Pulmonary/Chest: Effort normal. No respiratory distress. She has decreased breath sounds in the left middle field and the left lower field. She has no wheezes. She has no rales.  Abdominal: Soft. She exhibits distension. There is no tenderness. There is no rebound and no guarding.  Tense ascites throughout the abdomen but no localized tenderness  Musculoskeletal: Normal range of motion. She exhibits no edema or tenderness.  Muscle wasting of ext.  Neurological: She is alert and oriented to person, place, and time.  Skin: Skin is warm and dry. No rash noted. No erythema.  Psychiatric: She has a normal mood and affect. Her behavior is normal.  Nursing note and vitals reviewed.    ED Treatments / Results  Labs (all labs ordered are listed, but only abnormal results are displayed) Labs Reviewed  CBC WITH DIFFERENTIAL/PLATELET  I-STAT CHEM 8, ED    EKG None  Radiology Dg Chest 2 View  Result Date: 02/04/2018 CLINICAL DATA:  Shortness of breath. EXAM: CHEST - 2 VIEW COMPARISON:  Radiograph of January 29, 2018. FINDINGS: Large left pleural effusion appears to be slightly increased in size. Underlying atelectasis or infiltrate cannot be excluded. No pneumothorax is noted. Right lung is unremarkable. Bony thorax is unremarkable. IMPRESSION: Large left pleural effusion is noted which appears to be slightly increased in size. Underlying atelectasis or infiltrate may be present. Electronically Signed   By: Marijo Conception, M.D.   On: 02/04/2018 09:32    Procedures Procedures (including critical care time)  Medications Ordered in ED Medications - No data to  display   Initial Impression / Assessment and Plan / ED Course  I have reviewed the triage vital signs and the nursing notes.  Pertinent labs & imaging results that were available during my care of the patient were reviewed by me and considered in my medical decision making (see chart for details).     Patient presenting today requesting therapeutic paracentesis.  Patient has known metastatic ovarian cancer and is not undergoing chemotherapy at this time.  She gets therapeutic paracentesis and thoracentesis for symptom control at this time.  She takes no anticoagulation.  PT from 6 days ago was within normal limits.  Patient's platelet count has been greater than 400.  Discussed with IR and order placed for ultrasound.  Patient has persistent left-sided pleural effusion but does not feel like her breathing is any worse oxygen  saturation is greater than 90% and patient is not tachypneic. Blood counts are stable.  Pt had 8L of fluid removed from abd and feeling much better.  Will d/c home with her son.  Final Clinical Impressions(s) / ED Diagnoses   Final diagnoses:  Abdominal bloating  Malignant ascites    ED Discharge Orders    None       Blanchie Dessert, MD 02/04/18 1244

## 2018-02-08 ENCOUNTER — Telehealth: Payer: Self-pay | Admitting: Internal Medicine

## 2018-02-08 ENCOUNTER — Encounter: Payer: Self-pay | Admitting: Family Medicine

## 2018-02-08 ENCOUNTER — Ambulatory Visit: Payer: Self-pay | Attending: Family Medicine | Admitting: Family Medicine

## 2018-02-08 VITALS — BP 111/70 | HR 107 | Temp 98.5°F | Resp 18 | Ht 71.0 in | Wt 167.0 lb

## 2018-02-08 DIAGNOSIS — D4959 Neoplasm of unspecified behavior of other genitourinary organ: Secondary | ICD-10-CM

## 2018-02-08 DIAGNOSIS — R180 Malignant ascites: Secondary | ICD-10-CM

## 2018-02-08 DIAGNOSIS — D649 Anemia, unspecified: Secondary | ICD-10-CM

## 2018-02-08 DIAGNOSIS — Z885 Allergy status to narcotic agent status: Secondary | ICD-10-CM | POA: Insufficient documentation

## 2018-02-08 DIAGNOSIS — C801 Malignant (primary) neoplasm, unspecified: Secondary | ICD-10-CM | POA: Insufficient documentation

## 2018-02-08 DIAGNOSIS — C787 Secondary malignant neoplasm of liver and intrahepatic bile duct: Secondary | ICD-10-CM | POA: Insufficient documentation

## 2018-02-08 DIAGNOSIS — N3001 Acute cystitis with hematuria: Secondary | ICD-10-CM

## 2018-02-08 DIAGNOSIS — Z880 Allergy status to penicillin: Secondary | ICD-10-CM | POA: Insufficient documentation

## 2018-02-08 LAB — POCT URINALYSIS DIP (CLINITEK)
Bilirubin, UA: NEGATIVE
Glucose, UA: 500 mg/dL — AB
Nitrite, UA: NEGATIVE
Spec Grav, UA: >=1.03 — AB
Urobilinogen, UA: 0.2 U/dL
pH, UA: 5

## 2018-02-08 MED ORDER — NITROFURANTOIN MONOHYD MACRO 100 MG PO CAPS: 100.0000 mg | ORAL_CAPSULE | Freq: Two times a day (BID) | ORAL | 0 refills | Status: AC

## 2018-02-08 NOTE — Telephone Encounter (Signed)
See note below from Dr. Hilarie Fredrickson regarding referral.

## 2018-02-08 NOTE — Patient Instructions (Signed)
Ascites Ascites is a collection of excess fluid in the abdomen. Ascites can range from mild to severe. It can get worse without treatment. What are the causes? Possible causes include:  Cirrhosis. This is the most common cause of ascites.  Infection or inflammation in the abdomen.  Cancer in the abdomen.  Heart failure.  Kidney disease.  Inflammation of the pancreas.  Clots in the veins of the liver.  What are the signs or symptoms? Signs and symptoms may include:  A feeling of fullness in your abdomen. This is common.  An increase in the size of your abdomen or your waist.  Swelling in your legs.  Swelling of the scrotum in men.  Difficulty breathing.  Abdominal pain.  Sudden weight gain.  If the condition is mild, you may not have symptoms. How is this diagnosed? To make a diagnosis, your health care provider will:  Ask about your medical history.  Perform a physical exam.  Order imaging tests, such as an ultrasound or CT scan of your abdomen.  How is this treated? Treatment depends on the cause of the ascites. It may include:  Taking a pill to make you urinate. This is called a water pill (diuretic pill).  Strictly reducing your salt (sodium) intake. Salt can cause extra fluid to be kept in the body, and this makes ascites worse.  Having a procedure to remove fluid from your abdomen (paracentesis).  Having a procedure to transfer fluid from your abdomen into a vein.  Having a procedure that connects two of the major veins within your liver and relieves pressure on your liver (TIPS procedure).  Ascites may go away or improve with treatment of the condition that caused it. Follow these instructions at home:  Keep track of your weight. To do this, weigh yourself at the same time every day and record your weight.  Keep track of how much you drink and any changes in the amount you urinate.  Follow any instructions that your health care provider gives  you about how much to drink.  Try not to eat salty (high-sodium) foods.  Take medicines only as directed by your health care provider.  Keep all follow-up visits as directed by your health care provider. This is important.  Report any changes in your health to your health care provider, especially if you develop new symptoms or your symptoms get worse. Contact a health care provider if:  Your gain more than 3 pounds in 3 days.  Your abdominal size or your waist size increases.  You have new swelling in your legs.  The swelling in your legs gets worse. Get help right away if:  You develop a fever.  You develop confusion.  You develop new or worsening difficulty breathing.  You develop new or worsening abdominal pain.  You develop new or worsening swelling in the scrotum (in men). This information is not intended to replace advice given to you by your health care provider. Make sure you discuss any questions you have with your health care provider. Document Released: 06/09/2005 Document Revised: 10/17/2015 Document Reviewed: 01/06/2014 Elsevier Interactive Patient Education  2018 [place] With Your Family and Friends About Cancer When you have cancer, talking with your family and friends about it can be difficult. You can decide whom to tell and how much information you want to share. Effective communication can help you and your loved ones deal with emotions and help ensure that you get the help and support that  you need. How do I decide whom to tell about my cancer diagnosis? When you are ready to talk with others about your cancer diagnosis, here are a few things you can do:  Make a list of people you want to talk to face-to-face. ? If your spouse or partner does not know, he or she will most likely be first on your list. ? You will probably want to talk with your immediate family next. This may include children, brothers and sisters, and parents. ? Close  friends and other family members may be on your list as well.  Make a list of friends that you are not that close to, but would like to share your diagnosis with. It can be helpful to ask a willing friend or family member to talk with others.  Think about whether you will tell coworkers, and if so, how much you feel they need to know. You may want to start by talking with a manager or someone in human resources.  You can also ask your cancer care team what resources and support are available to help you talk with others about your diagnosis. What type of information should I share with others? After talking with your spouse or partner, you can prepare to talk with others by writing down key points in a notebook. As you talk with others, they may ask questions. Write down any questions so you can get more information from your cancer care team. Think about how much you want to share with friends and family. It is important to have a comfort level with what and how much you share. You may want to start with basic information that your cancer care team has shared with you. This may include:  The type of cancer you have.  The kind of treatments you may need.  The possible side effects of treatment, such as fatigue or loss of appetite.  Any physical changes that are expected, such as hair loss.  How long the treatment may take.  What changes will need to be made to your normal routine during treatment and recovery.  If you are comfortable with it, you may also want to talk about how you are feeling. What are some tips to help communicate with family?  Set aside a special time to talk with your family members.  Refer to key points in your notebook when needed.  Be reassuring and calm.  Answer any questions as truthfully as possible. You may not have all of the answers.  Have reliable sources of additional information that your family members can explore on their own.  Every person may  respond differently. Some may need time to reflect on what you have told them. What are some tips to help communicate with friends?  After talking with your family, reach out to others who are close to you. This will help you establish a support system outside of your family.  Let them know how they can help you. Be specific.  If you prefer that your news is not shared with others, clearly ask your friends to keep the information private. How should I respond when others ask how they can help? Family and friends often want to help but do not know how. Taking these steps can help ensure that you get the help and support you need:  Before you start treatment, talk about changes in your daily routine and how you may need help. These changes may include regular visits for treatment and time  resting at home to recover.  You may need to shift responsibilities at home to other family members for a while. This may include cooking, doing household chores, and caring for other family members.  Be clear about the kind of help you need from others outside of your family. Let others know if you need a ride to the clinic for treatment or need help with child care, housecleaning, grocery shopping, or other errands.  How can I give updates about how I am doing? It can take a lot out of you to keep updating people about your illness. Making or taking phone calls, answering e-mails, and hosting visitors can be tiring and stressful, especially during treatment. Some useful methods for keeping people updated about your condition include:  Assigning one or two "point people." These trusted family members or friends can provide regular updates to others. They can make needed phone calls, send out e-mails, and field questions.  Using social media or websites. Free services such as CarePages and CaringBridge make it easy to create a website to post updates. These websites also allow friends and family to leave  messages for you.  Reach out and let others know when you are up for a visit or social outing.  Where to find support: Look for support groups, mentors, and counselors to help you and your family. These resources can help you and your family:  Feel hopeful and not so alone.  Talk about, and work through, your feelings.  Deal with personal or work problems.  Cope with treatment and its side effects.  Where to find more information: For more information about talking with others about cancer, visit:  [place]: www.cancer.[place] [employer]: www.cancer.net  [employer]: www.cancer.gov  Summary  When you have cancer, talking with your family and friends about it can be difficult. How much you share with others is up to you.  Ask your cancer care team what resources and support are available to help you talk with others about your cancer diagnosis.  Before you start treatment, talk about changes in your daily routine and how you may need help.  Creating a social media site or assigning one or two point people are good ways to keep others updated about your condition.  Look for support groups, mentors, and counselors to help you and your family. This information is not intended to replace advice given to you by your health care provider. Make sure you discuss any questions you have with your health care provider. Document Released: 05/02/2016 Document Revised: 05/02/2016 Document Reviewed: 05/02/2016 Elsevier Interactive Patient Education  2018 [employer].

## 2018-02-08 NOTE — Telephone Encounter (Signed)
Pt with ovarian cancer that has chosen not to receive chemotherapy. Has malignant ascites and has been requiring paracentesis and thoracentesis. Being referred by [facility] to GI for ascites. Does pt need to see GI or have GYN Oncology manage symptoms. As DOD on 02/08/18 am please advise.

## 2018-02-08 NOTE — Telephone Encounter (Signed)
Would recommend that her GYN oncology team manage her malignant ascites. If GYN oncology specifically need my/our help I am happy to discuss by phone and we can determine how we can help.

## 2018-02-08 NOTE — Telephone Encounter (Signed)
Dr. Hilarie Fredrickson is Doc of the Day for 02/08/18 am. please see Epic referral. patient is being referred for  untreated ovarian cancer and has recurrent fluid accumulation in abdomen causing discomfort and shortness of breath. please advise on scheduling.

## 2018-02-08 NOTE — Progress Notes (Signed)
Subjective:    Patient ID: Tracy Flynn, female    DOB: 1955-11-25, 62 y.o.   MRN: [id number]  HPI 62 yo female, new to me as a patient, seen in follow-up of hospitalization and ED visit for malignant ascites.  Patient is status post emergency department visit on 02/04/2018 for removal of fluid from the abdomen to help with abdominal pressure and shortness of breath.  On review of chart, patient initially presented on 11/26/2017 to [facility] with complaint of shortness of breath and was found to have pleural effusion on chest x-ray but patient left AMA.  Patient then presented on 11/27/2017 and was admitted to [facility] where she was diagnosed with pelvic mass which was concerning for ovarian or uterine malignancy and patient also with evidence of metastatic disease to the liver.  Patient was referred to gynecologic oncology but failed to keep follow-up visit.  Patient presents today with complaint of continued abdominal distention and discomfort secondary to reaccumulation of fluid.  Patient states that she does have upcoming appointment with gynecologic oncology this upcoming Monday which she reports that she does intend to keep.  Patient believes that she may need to have more fluid removed from her abdomen. Patient is not taking the prescribed fluid pill.  Patient reports continued shortness of breath, abdominal swelling and swelling in her legs.  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  Patient denies any current fever or chills.  Patient denies nausea/vomiting.  Patient does have shortness of breath but no cough.  Patient has never smoked and does not drink alcohol.  Past Medical History:  Diagnosis Date  . Pleural effusion   . Uterine fibroid    Past Surgical History:  Procedure Laterality Date  . HERNIA REPAIR    . IR PARACENTESIS  01/15/2018  . IR THORACENTESIS ASP PLEURAL SPACE W/IMG GUIDE  12/11/2017  . IR THORACENTESIS  ASP PLEURAL SPACE W/IMG GUIDE  12/29/2017  . IR THORACENTESIS ASP PLEURAL SPACE W/IMG GUIDE  01/13/2018   Family History  Problem Relation Age of Onset  . Cancer Father    Allergies  Allergen Reactions  . Codeine Rash  . Penicillins Rash    Has patient had a PCN reaction causing immediate rash, facial/tongue/throat swelling, SOB or lightheadedness with hypotension: Yes Has patient had a PCN reaction causing severe rash involving mucus membranes or skin necrosis:No Has patient had a PCN reaction that required hospitalization: No Has patient had a PCN reaction occurring within the last 10 years: No If all of the above answers are "NO", then may proceed with Cephalosporin use.       Review of Systems  Constitutional: Positive for appetite change and fatigue. Negative for chills and fever.       Patient admits significant weight loss but states that she believes she has lost weight secondary to  changes in her diet  HENT: Negative for sore throat and trouble swallowing.   Respiratory: Positive for shortness of breath. Negative for cough and wheezing.   Cardiovascular: Positive for leg swelling. Negative for chest pain and palpitations.  Gastrointestinal: Positive for abdominal distention and abdominal pain (Patient with complaint of abdominal discomfort which she attributes to distention from fluid). Negative for nausea.  Genitourinary: Negative for dysuria and frequency.  Musculoskeletal: Positive for arthralgias, back pain and myalgias.  Neurological: Positive for light-headedness. Negative for headaches.  Psychiatric/Behavioral: Positive for sleep disturbance and suicidal ideas. The patient is  nervous/anxious.        Objective:   Physical Exam  Constitutional:  Patient with abdominal distension, appearance of temporal wasting, upper extremities appear thin. Patient is in no acute distress  HENT:  Mouth/Throat: Oropharynx is clear and moist.  Eyes: Conjunctivae and EOM are normal.    Neck: Normal range of motion. JVD present.  Cardiovascular: Normal rate and regular rhythm.  Pulmonary/Chest: Effort normal.  Decreased breath diminished on the left side and bilateral lower lungs  Abdominal: She exhibits distension. There is no tenderness. There is no rebound and no guarding.  Patient with distended abdomen with findings consistent with presence of ascites; presence of caput medusa as well as an enlarged lymph node above the umbilicus  Musculoskeletal: Normal range of motion. She exhibits edema (patient with 2plus pitting edema in the left LE below the knee and trace to 1 plus pitting on the right (despite patients use of light weight compression hose)).  Lymphadenopathy:    She has no cervical adenopathy.  Vitals reviewed. BP 111/70 (BP Location: Right Arm, Patient Position: Sitting, Cuff Size: Small)   Pulse (!) 107   Temp 98.5 F (36.9 C) (Oral)   Resp 18   Ht 5\' 11"  (1.803 m)   Wt 167 lb (75.8 kg)   SpO2 98%   BMI 23.29 kg/m       Assessment & Plan:  1. Acute cystitis with hematuria Nursing obtained urine sample for urinalysis secondary to patient with complaint of abdominal discomfort.  Patient's urinalysis was abnormal and suggestive of urinary tract infection/cystitis.  Patient will be placed on Macrobid while urine culture is pending.  Patient will be notified if the changes needed an antibiotic therapy based on the results - POCT URINALYSIS DIP (CLINITEK) - Urine Culture - nitrofurantoin, macrocrystal-monohydrate, (MACROBID) 100 MG capsule; Take 1 capsule (100 mg total) by mouth 2 (two) times daily for 5 days.  Dispense: 10 capsule; Refill: 0  2. Ovarian tumor Patient's medical records were reviewed.  Patient has had multiple ED visits and hospitalizations since June 2019 when she initially had shortness of breath and was evaluated at the emergency department and found to have a large left pleural effusion as well as a pelvic mass along with indications of  metastasis to the liver.  Patient failed to keep outpatient follow-up with oncology.  Patient's most recent visit to the emergency department was on 02/04/2018 for removal of abdominal fluid/ascites.  Patient does have an upcoming with a gynecologic oncologist this upcoming Monday and I discussed with the patient the importance of keeping this appointment.  I also in detail went over her past studies which indicate the presence of metastatic ovarian cancer.  Patient per prior notes has refused any treatment but I urged her to meet with the oncologist so that her questions could be discussed and answered.  Also discussed that patient should ask her oncologist or return to this clinic if she is interested in hospice enrollment. - Ambulatory referral to Oncology  3. Ascites, malignant Patient with malignant ascites for which she has been referred to gynecologic oncology and has upcoming appointment this Monday and I discussed the importance of her keeping this visit.  We will also place referral for gastroenterology as patient currently is going to the emergency room every time she has reaccumulation of fluid and if possible, gastroenterology may be able to assist in scheduled removal of ascitic fluid for patient comfort.  Patient was encouraged to continue her fluid pills and continue her  use of support hose/compressive stockings to help with peripheral edema.  Patient was advised to go to the emergency department over the next few days if she has continued shortness of breath/abdominal discomfort secondary to reaccumulation of fluid. - Ambulatory referral to Oncology - Ambulatory referral to Gastroenterology  4. Anemia, unspecified type Patient has had anemia on medical records which is likely related to chronic disease.  Patient will most likely have extensive blood work done this upcoming Monday when she sees the oncologist but if she does not have blood work, patient is encouraged to return to clinic for  further blood work and follow-up of her anemia and treatment as deemed appropriate based on the results.  An After Visit Summary was printed and given to the patient.  Return in about 2 weeks (around 02/22/2018) for uti/chronic issues.

## 2018-02-09 NOTE — Telephone Encounter (Signed)
Phone note sent to Dr. Chapman Fitch.

## 2018-02-09 NOTE — Telephone Encounter (Signed)
Patient has an appointment with GYN/ONC on Monday but when I saw her in clinic she was again having issues with abdominal discomfort due to fluid accumulation in her abdomen. I was hoping to get her set up with someone to help avoid her recurrent ED visits because my only other option was to have her go to the ED this week if her swelling or discomfort was getting worse. Unfortunately, she did not seem to comprehend her situation so I tried to explain what was happening to her and why. When I asked her what she had been told about her condition, she said, "Well, I have been told I am dying."

## 2018-02-10 ENCOUNTER — Telehealth: Payer: Self-pay | Admitting: *Deleted

## 2018-02-10 NOTE — Telephone Encounter (Signed)
Called and left the patient a message to call the office back. Need to see if the patient still wants to have an appt to see the office.

## 2018-02-15 ENCOUNTER — Other Ambulatory Visit: Payer: Self-pay

## 2018-02-15 ENCOUNTER — Emergency Department (HOSPITAL_COMMUNITY): Payer: Self-pay

## 2018-02-15 ENCOUNTER — Inpatient Hospital Stay (HOSPITAL_COMMUNITY)
Admission: EM | Admit: 2018-02-15 | Discharge: 2018-02-20 | DRG: 189 | Disposition: A | Discharge status: Home Health | Payer: Self-pay | Attending: Internal Medicine | Admitting: Internal Medicine

## 2018-02-15 ENCOUNTER — Encounter: Payer: Self-pay | Admitting: Obstetrics & Gynecology

## 2018-02-15 ENCOUNTER — Ambulatory Visit (INDEPENDENT_AMBULATORY_CARE_PROVIDER_SITE_OTHER): Payer: Self-pay | Admitting: Obstetrics & Gynecology

## 2018-02-15 ENCOUNTER — Encounter (HOSPITAL_COMMUNITY): Payer: Self-pay | Admitting: *Deleted

## 2018-02-15 VITALS — BP 92/66 | HR 105 | Wt 174.6 lb

## 2018-02-15 DIAGNOSIS — R0602 Shortness of breath: Secondary | ICD-10-CM

## 2018-02-15 DIAGNOSIS — Z87891 Personal history of nicotine dependence: Secondary | ICD-10-CM

## 2018-02-15 DIAGNOSIS — Z809 Family history of malignant neoplasm, unspecified: Secondary | ICD-10-CM

## 2018-02-15 DIAGNOSIS — Z1151 Encounter for screening for human papillomavirus (HPV): Secondary | ICD-10-CM

## 2018-02-15 DIAGNOSIS — Z515 Encounter for palliative care: Secondary | ICD-10-CM | POA: Diagnosis present

## 2018-02-15 DIAGNOSIS — Z124 Encounter for screening for malignant neoplasm of cervix: Secondary | ICD-10-CM

## 2018-02-15 DIAGNOSIS — R9389 Abnormal findings on diagnostic imaging of other specified body structures: Secondary | ICD-10-CM

## 2018-02-15 DIAGNOSIS — D75839 Thrombocytosis, unspecified: Secondary | ICD-10-CM | POA: Diagnosis present

## 2018-02-15 DIAGNOSIS — J9811 Atelectasis: Secondary | ICD-10-CM | POA: Diagnosis present

## 2018-02-15 DIAGNOSIS — K5900 Constipation, unspecified: Secondary | ICD-10-CM | POA: Diagnosis not present

## 2018-02-15 DIAGNOSIS — J90 Pleural effusion, not elsewhere classified: Secondary | ICD-10-CM

## 2018-02-15 DIAGNOSIS — R0600 Dyspnea, unspecified: Secondary | ICD-10-CM

## 2018-02-15 DIAGNOSIS — N390 Urinary tract infection, site not specified: Secondary | ICD-10-CM | POA: Diagnosis present

## 2018-02-15 DIAGNOSIS — C787 Secondary malignant neoplasm of liver and intrahepatic bile duct: Secondary | ICD-10-CM | POA: Diagnosis present

## 2018-02-15 DIAGNOSIS — D473 Essential (hemorrhagic) thrombocythemia: Secondary | ICD-10-CM

## 2018-02-15 DIAGNOSIS — Z0000 Encounter for general adult medical examination without abnormal findings: Secondary | ICD-10-CM

## 2018-02-15 DIAGNOSIS — C561 Malignant neoplasm of right ovary: Secondary | ICD-10-CM | POA: Diagnosis present

## 2018-02-15 DIAGNOSIS — E43 Unspecified severe protein-calorie malnutrition: Secondary | ICD-10-CM | POA: Diagnosis present

## 2018-02-15 DIAGNOSIS — C799 Secondary malignant neoplasm of unspecified site: Secondary | ICD-10-CM | POA: Diagnosis present

## 2018-02-15 DIAGNOSIS — D509 Iron deficiency anemia, unspecified: Secondary | ICD-10-CM | POA: Diagnosis present

## 2018-02-15 DIAGNOSIS — R64 Cachexia: Secondary | ICD-10-CM | POA: Diagnosis present

## 2018-02-15 DIAGNOSIS — J910 Malignant pleural effusion: Secondary | ICD-10-CM | POA: Diagnosis present

## 2018-02-15 DIAGNOSIS — Z880 Allergy status to penicillin: Secondary | ICD-10-CM

## 2018-02-15 DIAGNOSIS — Z66 Do not resuscitate: Secondary | ICD-10-CM | POA: Diagnosis present

## 2018-02-15 DIAGNOSIS — Z79899 Other long term (current) drug therapy: Secondary | ICD-10-CM

## 2018-02-15 DIAGNOSIS — D4959 Neoplasm of unspecified behavior of other genitourinary organ: Secondary | ICD-10-CM | POA: Diagnosis present

## 2018-02-15 DIAGNOSIS — R601 Generalized edema: Secondary | ICD-10-CM

## 2018-02-15 DIAGNOSIS — B9620 Unspecified Escherichia coli [E. coli] as the cause of diseases classified elsewhere: Secondary | ICD-10-CM | POA: Diagnosis present

## 2018-02-15 DIAGNOSIS — D72829 Elevated white blood cell count, unspecified: Secondary | ICD-10-CM

## 2018-02-15 DIAGNOSIS — N841 Polyp of cervix uteri: Secondary | ICD-10-CM

## 2018-02-15 DIAGNOSIS — D649 Anemia, unspecified: Secondary | ICD-10-CM | POA: Diagnosis present

## 2018-02-15 DIAGNOSIS — N83201 Unspecified ovarian cyst, right side: Secondary | ICD-10-CM

## 2018-02-15 DIAGNOSIS — D508 Other iron deficiency anemias: Secondary | ICD-10-CM | POA: Diagnosis present

## 2018-02-15 DIAGNOSIS — R180 Malignant ascites: Secondary | ICD-10-CM | POA: Diagnosis present

## 2018-02-15 DIAGNOSIS — Z885 Allergy status to narcotic agent status: Secondary | ICD-10-CM

## 2018-02-15 DIAGNOSIS — Z6824 Body mass index (BMI) 24.0-24.9, adult: Secondary | ICD-10-CM

## 2018-02-15 DIAGNOSIS — R188 Other ascites: Secondary | ICD-10-CM | POA: Diagnosis present

## 2018-02-15 DIAGNOSIS — J9601 Acute respiratory failure with hypoxia: Principal | ICD-10-CM | POA: Diagnosis present

## 2018-02-15 LAB — BRAIN NATRIURETIC PEPTIDE: B NATRIURETIC PEPTIDE 5: 58.3 pg/mL (ref 0.0–100.0)

## 2018-02-15 LAB — CBC WITH DIFFERENTIAL/PLATELET
BASOS PCT: 0 %
Basophils Absolute: 0 10*3/uL (ref 0.0–0.1)
EOS ABS: 0 10*3/uL (ref 0.0–0.7)
EOS PCT: 0 %
HCT: 31.7 % — ABNORMAL LOW (ref 36.0–46.0)
Hemoglobin: 9.7 g/dL — ABNORMAL LOW (ref 12.0–15.0)
Lymphocytes Relative: 4 %
Lymphs Abs: 0.6 10*3/uL — ABNORMAL LOW (ref 0.7–4.0)
MCH: 24.3 pg — ABNORMAL LOW (ref 26.0–34.0)
MCHC: 30.6 g/dL (ref 30.0–36.0)
MCV: 79.4 fL (ref 78.0–100.0)
Monocytes Absolute: 1 10*3/uL (ref 0.1–1.0)
Monocytes Relative: 6 %
NEUTROS PCT: 90 %
Neutro Abs: 14.1 10*3/uL — ABNORMAL HIGH (ref 1.7–7.7)
PLATELETS: 932 10*3/uL — AB (ref 150–400)
RBC: 3.99 MIL/uL (ref 3.87–5.11)
RDW: 20.6 % — ABNORMAL HIGH (ref 11.5–15.5)
WBC: 15.7 10*3/uL — AB (ref 4.0–10.5)

## 2018-02-15 LAB — COMPREHENSIVE METABOLIC PANEL
ALT: 15 U/L (ref 0–44)
AST: 33 U/L (ref 15–41)
Albumin: 1.8 g/dL — ABNORMAL LOW (ref 3.5–5.0)
Alkaline Phosphatase: 175 U/L — ABNORMAL HIGH (ref 38–126)
Anion gap: 10 (ref 5–15)
BILIRUBIN TOTAL: 0.7 mg/dL (ref 0.3–1.2)
BUN: 28 mg/dL — ABNORMAL HIGH (ref 8–23)
CO2: 24 mmol/L (ref 22–32)
CREATININE: 0.89 mg/dL (ref 0.44–1.00)
Calcium: 9 mg/dL (ref 8.9–10.3)
Chloride: 101 mmol/L (ref 98–111)
Glucose, Bld: 99 mg/dL (ref 70–99)
Potassium: 4.4 mmol/L (ref 3.5–5.1)
Sodium: 135 mmol/L (ref 135–145)
TOTAL PROTEIN: 7.9 g/dL (ref 6.5–8.1)

## 2018-02-15 LAB — MRSA PCR SCREENING: MRSA BY PCR: NEGATIVE

## 2018-02-15 LAB — TROPONIN I: Troponin I: 0.08 ng/mL (ref <0.03)

## 2018-02-15 LAB — MAGNESIUM: Magnesium: 2.5 mg/dL — ABNORMAL HIGH (ref 1.7–2.4)

## 2018-02-15 MED ORDER — FUROSEMIDE 10 MG/ML IJ SOLN
60.0000 mg | Freq: Three times a day (TID) | INTRAMUSCULAR | Status: DC
  Filled 2018-02-15: qty 6

## 2018-02-15 MED ORDER — ORAL CARE MOUTH RINSE
15.0000 mL | Freq: Two times a day (BID) | OROMUCOSAL | Status: DC
  Administered 2018-02-18 – 2018-02-19 (×3): 15 mL via OROMUCOSAL

## 2018-02-15 MED ORDER — VITAMIN B-12 100 MCG PO TABS
100.0000 ug | ORAL_TABLET | Freq: Every day | ORAL | Status: DC
  Administered 2018-02-16 – 2018-02-20 (×5): 100 ug via ORAL
  Filled 2018-02-15 (×5): qty 1

## 2018-02-15 MED ORDER — FERROUS SULFATE 325 (65 FE) MG PO TABS
325.0000 mg | ORAL_TABLET | Freq: Two times a day (BID) | ORAL | Status: DC
  Administered 2018-02-16 – 2018-02-20 (×9): 325 mg via ORAL
  Filled 2018-02-15 (×9): qty 1

## 2018-02-15 MED ORDER — SODIUM CHLORIDE 0.9% FLUSH: 3.0000 mL | INTRAVENOUS | Status: DC | PRN

## 2018-02-15 MED ORDER — CHLORHEXIDINE GLUCONATE 0.12 % MT SOLN
15.0000 mL | Freq: Two times a day (BID) | OROMUCOSAL | Status: DC
  Administered 2018-02-16 – 2018-02-20 (×8): 15 mL via OROMUCOSAL
  Filled 2018-02-15 (×9): qty 15

## 2018-02-15 MED ORDER — FLEET ENEMA 7-19 GM/118ML RE ENEM: 1.0000 | ENEMA | Freq: Once | RECTAL | Status: DC | PRN

## 2018-02-15 MED ORDER — LORATADINE 10 MG PO TABS
10.0000 mg | ORAL_TABLET | Freq: Every day | ORAL | Status: DC
  Administered 2018-02-16 – 2018-02-19 (×5): 10 mg via ORAL
  Filled 2018-02-15 (×5): qty 1

## 2018-02-15 MED ORDER — SODIUM CHLORIDE 0.9 % IV SOLN
250.0000 mL | INTRAVENOUS | Status: DC | PRN
  Administered 2018-02-17: 40 mL via INTRAVENOUS

## 2018-02-15 MED ORDER — ACETAMINOPHEN 650 MG RE SUPP: 650.0000 mg | Freq: Four times a day (QID) | RECTAL | Status: DC | PRN

## 2018-02-15 MED ORDER — ENOXAPARIN SODIUM 40 MG/0.4ML ~~LOC~~ SOLN
40.0000 mg | SUBCUTANEOUS | Status: DC
  Administered 2018-02-15 – 2018-02-19 (×5): 40 mg via SUBCUTANEOUS
  Filled 2018-02-15 (×5): qty 0.4

## 2018-02-15 MED ORDER — SORBITOL 70 % SOLN
30.0000 mL | Freq: Every day | Status: DC | PRN
  Filled 2018-02-15: qty 30

## 2018-02-15 MED ORDER — ALBUMIN HUMAN 25 % IV SOLN: 25.0000 g | Freq: Once | INTRAVENOUS | Status: DC

## 2018-02-15 MED ORDER — FOLIC ACID 1 MG PO TABS
1.0000 mg | ORAL_TABLET | Freq: Every day | ORAL | Status: DC
  Administered 2018-02-16 – 2018-02-20 (×5): 1 mg via ORAL
  Filled 2018-02-15 (×5): qty 1

## 2018-02-15 MED ORDER — TRAMADOL HCL 50 MG PO TABS
100.0000 mg | ORAL_TABLET | Freq: Four times a day (QID) | ORAL | Status: DC | PRN
  Administered 2018-02-19: 100 mg via ORAL
  Filled 2018-02-15: qty 2

## 2018-02-15 MED ORDER — SENNOSIDES-DOCUSATE SODIUM 8.6-50 MG PO TABS: 1.0000 | ORAL_TABLET | Freq: Every evening | ORAL | Status: DC | PRN

## 2018-02-15 MED ORDER — FUROSEMIDE 10 MG/ML IJ SOLN
60.0000 mg | Freq: Two times a day (BID) | INTRAMUSCULAR | Status: DC
  Administered 2018-02-16 (×2): 60 mg via INTRAVENOUS
  Filled 2018-02-15: qty 6

## 2018-02-15 MED ORDER — SODIUM CHLORIDE 0.9% FLUSH
3.0000 mL | Freq: Two times a day (BID) | INTRAVENOUS | Status: DC
  Administered 2018-02-15 – 2018-02-19 (×8): 3 mL via INTRAVENOUS

## 2018-02-15 MED ORDER — ONDANSETRON HCL 4 MG/2ML IJ SOLN: 4.0000 mg | Freq: Four times a day (QID) | INTRAMUSCULAR | Status: DC | PRN

## 2018-02-15 MED ORDER — LIDOCAINE HCL 1 % IJ SOLN
INTRAMUSCULAR | Status: AC
  Filled 2018-02-15: qty 20

## 2018-02-15 MED ORDER — ONDANSETRON HCL 4 MG PO TABS: 4.0000 mg | ORAL_TABLET | Freq: Four times a day (QID) | ORAL | Status: DC | PRN

## 2018-02-15 MED ORDER — ACETAMINOPHEN 325 MG PO TABS: 650.0000 mg | ORAL_TABLET | Freq: Four times a day (QID) | ORAL | Status: DC | PRN

## 2018-02-15 MED ORDER — IBUPROFEN 200 MG PO TABS: 400.0000 mg | ORAL_TABLET | Freq: Every day | ORAL | Status: DC | PRN

## 2018-02-15 MED ORDER — LEVALBUTEROL HCL 0.63 MG/3ML IN NEBU: 0.6300 mg | INHALATION_SOLUTION | RESPIRATORY_TRACT | Status: DC | PRN

## 2018-02-15 NOTE — ED Provider Notes (Signed)
  Physical Exam  BP 126/79 (BP Location: Left Arm)   Pulse (!) 104   Temp 97.7 F (36.5 C) (Oral)   Resp (!) 24   SpO2 92%   Physical Exam  ED Course/Procedures     Procedures  MDM  Care assumed at 4 pm. Patient has ascites, pleural effusion and is currently getting worked up for likely ovarian cancer. Has more SOB today and required oxygen in the ED. Sign out pending thoracentesis and reassessment.   5:37 PM US thoracentesis performed and took out 1 L. CXR afterwards still has large pleural effusion. Tech attempted to ambulate patient and she became tachypneic to 30s, tachycardic to 130s. Unable to walk more than 30 feet. She likely also need paracentesis or repeat thoracentesis or a drain tomorrow. Will admit for respiratory distress from pleural effusion, ascites.    Drenda Freeze, MD 02/15/18 1739

## 2018-02-15 NOTE — ED Notes (Signed)
Pt refused to be placed on 2 L [place]

## 2018-02-15 NOTE — Progress Notes (Signed)
eLink Physician-Brief Progress Note Patient Name: Tracy Flynn DOB: 03/22/1956 MRN: [id number]   Date of Service  02/15/2018  HPI/Events of Note  62 yo female with recurrent L malignant pleural effusion. Had L thoracentesis done in ED (by IR I am told) with removal of 1 liter. Still has residual large volume L pleural effusion. PCCM consulted for further management. VSS.  eICU Interventions  No new orders.      Intervention Category Evaluation Type: New Patient Evaluation  Lysle Dingwall 02/15/2018, 8:15 PM

## 2018-02-15 NOTE — ED Notes (Signed)
ED TO INPATIENT HANDOFF REPORT  Name/Age/Gender Tracy Flynn 62 y.o. female  Code Status Code Status History    Date Active Date Inactive Code Status Order ID Comments User Context   01/12/2018 2232 01/16/2018 1744 Full Code [id number]  Rise Patience, MD Inpatient   11/27/2017 1338 11/28/2017 2027 Full Code [id number]  Shelly Coss, MD ED      Home/SNF/Other Home  Chief Complaint shob  Level of Care/Admitting Diagnosis ED Disposition    ED Disposition Condition [place]: [facility] [[id number]]  Level of Care: Stepdown [14]  Admit to SDU based on following criteria: Respiratory Distress:  Frequent assessment and/or intervention to maintain adequate ventilation/respiration, pulmonary toilet, and respiratory treatment.  Diagnosis: Acute respiratory failure with hypoxia [facility]) [[id number]]  Admitting Physician: Eugenie Filler [place]  Attending Physician: Eugenie Filler [[id number]]  Estimated length of stay: past midnight tomorrow  Certification:: I certify this patient will need inpatient services for at least 2 midnights  PT Class (Do Not Modify): Inpatient [101]  PT Acc Code (Do Not Modify): Private [1]       Medical History Past Medical History:  Diagnosis Date  . Pleural effusion   . Uterine fibroid     Allergies Allergies  Allergen Reactions  . Codeine Rash  . Penicillins Rash    Has patient had a PCN reaction causing immediate rash, facial/tongue/throat swelling, SOB or lightheadedness with hypotension: Yes Has patient had a PCN reaction causing severe rash involving mucus membranes or skin necrosis:No Has patient had a PCN reaction that required hospitalization: No Has patient had a PCN reaction occurring within the last 10 years: No If all of the above answers are "NO", then may proceed with Cephalosporin use.     IV Location/Drains/Wounds Patient Lines/Drains/Airways Status   Active Line/Drains/Airways    Name:    Placement date:   Placement time:   Site:   Days:   Peripheral IV 02/15/18 Right Antecubital   02/15/18    1203    Antecubital   less than 1          Labs/Imaging Results for orders placed or performed during the hospital encounter of 02/15/18 (from the past 48 hour(s))  Comprehensive metabolic panel     Status: Abnormal   Collection Time: 02/15/18 11:23 AM  Result Value Ref Range   Sodium 135 135 - 145 mmol/L   Potassium 4.4 3.5 - 5.1 mmol/L   Chloride 101 98 - 111 mmol/L   CO2 24 22 - 32 mmol/L   Glucose, Bld 99 70 - 99 mg/dL   BUN 28 (H) 8 - 23 mg/dL   Creatinine, Ser 0.89 0.44 - 1.00 mg/dL   Calcium 9.0 8.9 - 10.3 mg/dL   Total Protein 7.9 6.5 - 8.1 g/dL   Albumin 1.8 (L) 3.5 - 5.0 g/dL   AST 33 15 - 41 U/L   ALT 15 0 - 44 U/L   Alkaline Phosphatase 175 (H) 38 - 126 U/L   Total Bilirubin 0.7 0.3 - 1.2 mg/dL   GFR calc non Af Amer >60 >60 mL/min   GFR calc Af Amer >60 >60 mL/min    Comment: (NOTE) The eGFR has been calculated using the CKD EPI equation. This calculation has not been validated in all clinical situations. eGFR's persistently <60 mL/min signify possible Chronic Kidney Disease.    Anion gap 10 5 - 15    Comment: Performed at [facility], [place]  [address], [place], [place] [address]  CBC WITH DIFFERENTIAL     Status: Abnormal   Collection Time: 02/15/18 11:23 AM  Result Value Ref Range   WBC 15.7 (H) 4.0 - 10.5 K/uL   RBC 3.99 3.87 - 5.11 MIL/uL   Hemoglobin 9.7 (L) 12.0 - 15.0 g/dL   HCT 31.7 (L) 36.0 - 46.0 %   MCV 79.4 78.0 - 100.0 fL   MCH 24.3 (L) 26.0 - 34.0 pg   MCHC 30.6 30.0 - 36.0 g/dL   RDW 20.6 (H) 11.5 - 15.5 %   Platelets 932 (HH) 150 - 400 K/uL    Comment: CRITICAL RESULT CALLED TO, READ BACK BY AND VERIFIED WITH: Rosamaria Donn,N RN _0  ON 8.26.19 BY NMCCOY    Neutrophils Relative % 90 %   Neutro Abs 14.1 (H) 1.7 - 7.7 K/uL   Lymphocytes Relative 4 %   Lymphs Abs 0.6 (L) 0.7 - 4.0 K/uL   Monocytes Relative 6 %    Monocytes Absolute 1.0 0.1 - 1.0 K/uL   Eosinophils Relative 0 %   Eosinophils Absolute 0.0 0.0 - 0.7 K/uL   Basophils Relative 0 %   Basophils Absolute 0.0 0.0 - 0.1 K/uL    Comment: Performed at [facility], [place] [address] [place], [place] [address]  Troponin I (MHP)     Status: Abnormal   Collection Time: 02/15/18 11:23 AM  Result Value Ref Range   Troponin I 0.08 (HH) <0.03 ng/mL    Comment: CRITICAL RESULT CALLED TO, READ BACK BY AND VERIFIED WITH: WEST,S. RN AT 1309 02/15/18 MULLINS,T Performed at [facility], [place] [address], [place], [place] [address]   Brain natriuretic peptide     Status: None   Collection Time: 02/15/18 11:23 AM  Result Value Ref Range   B Natriuretic Peptide 58.3 0.0 - 100.0 pg/mL    Comment: Performed at [facility], [place] [address] [place], [place] [address]   Dg Chest 1 View  Result Date: 02/15/2018 CLINICAL DATA:  Thoracentesis EXAM: CHEST  1 VIEW COMPARISON:  Earlier today FINDINGS: Decrease in left pleural fluid, although still large volume. No pneumothorax. Low volume but clear right lung. The heart is obscured. IMPRESSION: 1. No evidence of complication from left thoracentesis. 2. Residual pleural fluid remains large volume. Electronically Signed   By: Monte Fantasia M.D.   On: 02/15/2018 16:14   Dg Chest 2 View  Result Date: 02/15/2018 CLINICAL DATA:  Weakness, shortness of breath, upper back pain, and abdominal distention and tightness for the past several days. History of thoracentesis on the left as recently is January 13, 2018. History of ovarian tumor. EXAM: CHEST - 2 VIEW COMPARISON:  Chest x-ray of February 04, 2018 FINDINGS: There has been slight further accumulation of pleural fluid on the left. Only approximately 10-15% of the left lung remains aerated. There is a small right pleural effusion which is stable. The lung parenchymal on the right is clear. The right heart border and right  pulmonary vascularity is unremarkable. No pulmonary edema on the right is observed. IMPRESSION: Large left pleural effusion resulting in only 10-15% of the left lung being aerated. This is slightly more conspicuous than on the previous study. Stable tiny right pleural effusion. Electronically Signed   By: David  Martinique M.D.   On: 02/15/2018 11:46   US Thoracentesis Asp Pleural Space W/img Guide  Result Date: 02/15/2018 INDICATION: Recurrent symptomatic pleural effusion; history of metastatic ovarian cancer. Request for therapeutic thoracentesis.  EXAM: ULTRASOUND GUIDED LEFT THORACENTESIS MEDICATIONS: 10 mL 1% lidocaine COMPLICATIONS: None immediate. PROCEDURE: An ultrasound guided thoracentesis was thoroughly discussed with the patient and questions answered. The benefits, risks, alternatives and complications were also discussed. The patient understands and wishes to proceed with the procedure. Written consent was obtained. Ultrasound was performed to localize and mark an adequate pocket of fluid in the left chest. The area was then prepped and draped in the normal sterile fashion. 1% Lidocaine was used for local anesthesia. Under ultrasound guidance a 6 Fr Safe-T-Centesis catheter was introduced. Thoracentesis was performed. The catheter was removed and a dressing applied. FINDINGS: A total of approximately 1 L of hazy tan fluid was removed. IMPRESSION: Successful ultrasound guided left thoracentesis yielding 1 L of pleural fluid. Read by Candiss Norse, PA-C Electronically Signed   By: Sandi Mariscal M.D.   On: 02/15/2018 17:06    Pending Labs Unresulted Labs (From admission, onward)    Start     Ordered   02/15/18 1846  Urinalysis, Routine w reflex microscopic  Once,   R     02/15/18 1845   02/15/18 1846  Culture, Urine  Once,   R     02/15/18 1845   02/15/18 1123  Pathologist smear review  Once,   STAT     02/15/18 1123   Signed and Held  Magnesium  Add-on,   R     Signed and Held   Signed and  Held  CBC WITH DIFFERENTIAL  Tomorrow morning,   R     Signed and Held   Signed and Held  Comprehensive metabolic panel  Tomorrow morning,   R     Signed and Held   Signed and Held  Vitamin B12  (Anemia Panel (PNL))  Tomorrow morning,   R     Signed and Held   Signed and Held  Folate  (Anemia Panel (PNL))  Tomorrow morning,   R     Signed and Held   Signed and Held  Iron and TIBC  (Anemia Panel (PNL))  Tomorrow morning,   R     Signed and Held   Signed and Held  Ferritin  (Anemia Panel (PNL))  Tomorrow morning,   R     Signed and Held   Signed and Held  Prealbumin  Once,   R     Signed and Held          Vitals/Pain Today's Vitals   02/15/18 1459 02/15/18 1539 02/15/18 1555 02/15/18 1839  BP: (!) 120/95 122/81 126/79 135/90  Pulse:      Resp: (!) 24   (!) 27  Temp:      TempSrc:      SpO2: 92%     PainSc:        Isolation Precautions No active isolations  Medications Medications  lidocaine (XYLOCAINE) 1 % (with pres) injection (has no administration in time range)  albumin human 25 % solution 25 g (has no administration in time range)    Mobility walks with person assist

## 2018-02-15 NOTE — ED Triage Notes (Signed)
Pt is concerned she has fluid on her lung. Pt states she's felt weak, short of breath, upper back pain, abdominal distension and tightness for the past few days.

## 2018-02-15 NOTE — H&P (Signed)
History and Physical    Tracy Flynn VOJ:[id number] DOB: 20-Feb-1956 DOA: 02/15/2018  PCP: Antony Blackbird, MD  Patient coming from: Home  I have personally briefly reviewed patient's old medical records in [place]  Chief Complaint: Shortness of breath/fluid in legs  HPI: Tracy Flynn is a unfortumate 62 y.o. 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  Patient denies any fevers, no nausea, no vomiting, no chest pain, no diarrhea, no constipation, no dysuria, no hematemesis, no hematochezia, no melena, no syncopal episodes, no focal neurological deficits.  Patient does endorse some difficulty ambulating due to worsening lower extremity edema.  Patient endorses paroxysmal nocturnal dyspnea and orthopnea stating that she pretty much sleeps in the recliner.  ED Course: Patient seen in the ED, compressive metabolic profile obtained with a BUN of 28, alk phosphatase of 175, albumin of 1.8, BNP of 58.3, troponin of 0.08.  CBC had a white count of 15.7, hemoglobin of 9.7, platelet count of 932.  Chest ray obtained showed a large left pleural effusion resulting in only 10 to 15% of left lung being aerated, stable tiny right pleural effusion.  Patient underwent ultrasound-guided thoracentesis per IR on 02/15/2018 with 1 L of hazy tan fluid removed.  Repeat chest x-ray post thoracentesis with no significant improvement.  Tech in the ED attempted to ambulate the patient post thoracentesis  and patient became tachypneic in the 30s, tachycardic in the 130s unable to walk more than 30 feet.  Hospitalist were called to admit the patient for further evaluation and management.  Review of Systems: As per HPI otherwise 10 point review of systems negative.  Past Medical History:  Diagnosis Date  . Pleural effusion   . Uterine fibroid     Past Surgical History:  Procedure Laterality Date  . HERNIA REPAIR    . IR PARACENTESIS  01/15/2018  . IR THORACENTESIS ASP PLEURAL SPACE W/IMG GUIDE  12/11/2017  . IR THORACENTESIS ASP PLEURAL SPACE W/IMG GUIDE  12/29/2017  . IR THORACENTESIS ASP PLEURAL SPACE W/IMG GUIDE  01/13/2018     reports that she has quit smoking. She has never used smokeless tobacco. She reports that she does not drink alcohol or use drugs.  Allergies  Allergen Reactions  . Codeine Rash  . Penicillins Rash    Has patient had a PCN reaction causing immediate rash, facial/tongue/throat swelling, SOB or lightheadedness with hypotension: Yes Has patient had a PCN reaction causing severe rash involving mucus membranes or skin necrosis:No Has patient had a PCN reaction that required hospitalization: No Has patient had a PCN reaction occurring within the last 10 years: No If all of the above answers are "NO", then may proceed with Cephalosporin use.     Family History  Problem Relation Age of Onset  . Cancer Father    Father deceased age 47 from prostate cancer.  Mother alive age 82 and per patient is sick.  Prior to Admission medications   Medication Sig Start Date End Date Taking? Authorizing Provider  cetirizine (ZYRTEC) 10 MG tablet  Take 1 tablet (10 mg total) by mouth daily. Patient taking differently: Take 10 mg by mouth daily as needed for allergies.  12/10/17  Yes Brayton Caves, PA-C  ferrous sulfate 325 (65 FE) MG tablet Take 1 tablet (325 mg total) by mouth 2 (two) times daily with a meal. 01/16/18  Yes Georgette Shell, MD  folic acid (FOLVITE) 1 MG  tablet Take 1 tablet (1 mg total) by mouth daily. 01/17/18  Yes Georgette Shell, MD  furosemide (LASIX) 20 MG tablet Take 20 mg by mouth daily.   Yes [provider]  ibuprofen (ADVIL,MOTRIN) 200 MG tablet Take 400 mg by mouth daily as needed for moderate pain.   Yes [provider]  OVER THE COUNTER MEDICATION Apply 1 application topically 2 (two) times daily as needed (pain).   Yes [provider]  potassium chloride (K-DUR) 10 MEQ tablet Take 1 tablet (10 mEq total) by mouth daily. 01/16/18  Yes Georgette Shell, MD  vitamin B-12 100 MCG tablet Take 1 tablet (100 mcg total) by mouth daily. 01/17/18  Yes Georgette Shell, MD    Physical Exam: Vitals:   02/15/18 1459 02/15/18 1539 02/15/18 1555 02/15/18 1839  BP: (!) 120/95 122/81 126/79 135/90  Pulse:      Resp: (!) 24   (!) 27  Temp:      TempSrc:      SpO2: 92%       Constitutional: Cachectic, temporal wasting, frail, anasarca.  Vitals:   02/15/18 1459 02/15/18 1539 02/15/18 1555 02/15/18 1839  BP: (!) 120/95 122/81 126/79 135/90  Pulse:      Resp: (!) 24   (!) 27  Temp:      TempSrc:      SpO2: 92%      Eyes: PERRLA, lids and conjunctivae normal ENMT: Mucous membranes are moist. Posterior pharynx clear of any exudate or lesions.Normal dentition.  Neck: normal, supple, no masses, no thyromegaly Respiratory: Clear to auscultation right lung, no breath sounds noted in the left lung.  No wheezing, no crackles, no rhonchi.  Speaking in full sentences.   Cardiovascular: Regular rate and rhythm, no murmurs / rubs / gallops.  3-4+ bilateral lower extremity edema up to hips. 2+ pedal pulses. No carotid bruits.  Abdomen: Tight, significantly distended, decreased bowel sounds, no rebound, no guarding, nontender to palpation.   Musculoskeletal: no clubbing / cyanosis. No joint deformity upper and lower extremities. Good ROM, no contractures. Normal muscle tone.  Skin: no rashes, lesions, ulcers. No  induration Neurologic: CN 2-12 grossly intact. Sensation intact, DTR normal. Strength 5/5 in all 4.  Psychiatric: Normal judgment and insight. Alert and oriented x 3. Normal mood.   Labs on Admission: I have personally reviewed following labs and imaging studies  CBC: Recent Labs  Lab 02/15/18 1123  WBC 15.7*  NEUTROABS 14.1*  HGB 9.7*  HCT 31.7*  MCV 79.4  PLT 710*   Basic Metabolic Panel: Recent Labs  Lab 02/15/18 1123  NA 135  K 4.4  CL 101  CO2 24  GLUCOSE 99  BUN 28*  CREATININE 0.89  CALCIUM 9.0   GFR: Estimated Creatinine Clearance: 73.3 mL/min (by C-G formula based on SCr of 0.89 mg/dL). Liver Function Tests: Recent Labs  Lab 02/15/18 1123  AST 33  ALT 15  ALKPHOS 175*  BILITOT 0.7  PROT 7.9  ALBUMIN 1.8*   No results for input(s): LIPASE, AMYLASE in the last 168 hours. No results for input(s): AMMONIA  in the last 168 hours. Coagulation Profile: No results for input(s): INR, PROTIME in the last 168 hours. Cardiac Enzymes: Recent Labs  Lab 02/15/18 1123  TROPONINI 0.08*   BNP (last 3 results) No results for input(s): PROBNP in the last 8760 hours. HbA1C: No results for input(s): HGBA1C in the last 72 hours. CBG: No results for input(s): GLUCAP in the last 168 hours. Lipid Profile: No results for input(s): CHOL, HDL, LDLCALC, TRIG, CHOLHDL, LDLDIRECT in the last 72 hours. Thyroid Function Tests: No results for input(s): TSH, T4TOTAL, FREET4, T3FREE, THYROIDAB in the last 72 hours. Anemia Panel: No results for input(s): VITAMINB12, FOLATE, FERRITIN, TIBC, IRON, RETICCTPCT in the last 72 hours. Urine analysis:    Component Value Date/Time   COLORURINE YELLOW 11/26/2017 1906   APPEARANCEUR CLEAR 11/26/2017 1906   LABSPEC 1.020 11/26/2017 1906   PHURINE 5.5 11/26/2017 1906   GLUCOSEU NEGATIVE 11/26/2017 1906   HGBUR MODERATE (A) 11/26/2017 1906   BILIRUBINUR negative 02/08/2018 0953   KETONESUR small (15) (A) 02/08/2018 0953   KETONESUR 15  (A) 11/26/2017 1906   PROTEINUR NEGATIVE 11/26/2017 1906   UROBILINOGEN 0.2 02/08/2018 0953   NITRITE Negative 02/08/2018 0953   NITRITE NEGATIVE 11/26/2017 1906   LEUKOCYTESUR Large (3+) (A) 02/08/2018 0953    Radiological Exams on Admission: Dg Chest 1 View  Result Date: 02/15/2018 CLINICAL DATA:  Thoracentesis EXAM: CHEST  1 VIEW COMPARISON:  Earlier today FINDINGS: Decrease in left pleural fluid, although still large volume. No pneumothorax. Low volume but clear right lung. The heart is obscured. IMPRESSION: 1. No evidence of complication from left thoracentesis. 2. Residual pleural fluid remains large volume. Electronically Signed   By: Monte Fantasia M.D.   On: 02/15/2018 16:14   Dg Chest 2 View  Result Date: 02/15/2018 CLINICAL DATA:  Weakness, shortness of breath, upper back pain, and abdominal distention and tightness for the past several days. History of thoracentesis on the left as recently is January 13, 2018. History of ovarian tumor. EXAM: CHEST - 2 VIEW COMPARISON:  Chest x-ray of February 04, 2018 FINDINGS: There has been slight further accumulation of pleural fluid on the left. Only approximately 10-15% of the left lung remains aerated. There is a small right pleural effusion which is stable. The lung parenchymal on the right is clear. The right heart border and right pulmonary vascularity is unremarkable. No pulmonary edema on the right is observed. IMPRESSION: Large left pleural effusion resulting in only 10-15% of the left lung being aerated. This is slightly more conspicuous than on the previous study. Stable tiny right pleural effusion. Electronically Signed   By: David  Martinique M.D.   On: 02/15/2018 11:46   US Thoracentesis Asp Pleural Space W/img Guide  Result Date: 02/15/2018 INDICATION: Recurrent symptomatic pleural effusion; history of metastatic ovarian cancer. Request for therapeutic thoracentesis. EXAM: ULTRASOUND GUIDED LEFT THORACENTESIS MEDICATIONS: 10 mL 1% lidocaine  COMPLICATIONS: None immediate. PROCEDURE: An ultrasound guided thoracentesis was thoroughly discussed with the patient and questions answered. The benefits, risks, alternatives and complications were also discussed. The patient understands and wishes to proceed with the procedure. Written consent was obtained. Ultrasound was performed to localize and mark an adequate pocket of fluid in the left chest. The area was then prepped and draped in the normal sterile fashion. 1% Lidocaine was used for local anesthesia. Under ultrasound guidance a 6 Fr Safe-T-Centesis catheter was introduced. Thoracentesis was performed. The catheter was removed and a dressing applied. FINDINGS: A total of approximately 1 L of  hazy tan fluid was removed. IMPRESSION: Successful ultrasound guided left thoracentesis yielding 1 L of pleural fluid. Read by Candiss Norse, PA-C Electronically Signed   By: Sandi Mariscal M.D.   On: 02/15/2018 17:06    EKG: Sinus tachycardia.  Poor R wave progression.  Assessment/Plan Principal Problem:   Acute respiratory failure with hypoxia (HCC) Active Problems:   Ovarian tumor   Thrombocytosis (HCC)   Anasarca   Severe protein-calorie malnutrition (HCC)   Normochromic normocytic anemia   Recurrent pleural effusion on left   Cancer, metastatic (HCC)   Malignant pleural effusion: Left   Ascites   Leukocytosis   #1 acute respiratory failure likely secondary to recurrent malignant pleural effusion with left hemithorax and probable recurrent malignant ascites/anasarca Patient presenting with worsening shortness of breath with lower extremity edema noted to have recurrent malignant pleural effusion with left hemithorax and 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  Patient underwent ultrasound-guided thoracentesis while in the ED with 1 L of fluid removed.  Labs not sent.  Patient noted to have prior thoracentesis 01/13/2018 with cytology consistent with malignant cells consistent with carcinoma.   Patient was seen in consultation during prior hospitalization by oncology and palliative care however at that time did not want chemotherapy.  Will admit patient to stepdown unit.  Will order ultrasound-guided diagnostic and therapeutic paracentesis as patient with complaints of chills noted to have a leukocytosis.  We will give IV albumin prior to paracentesis.  Will place on IV Lasix.  Will consult with pulmonary and critical care medicine on 02/16/2018.  We will inform E link of patient's admission just in case patient decompensates overnight.  Supportive care.  2.  Leukocytosis Questionable etiology.  Check a UA with cultures and sensitivities.  Chest x-ray with no acute infiltrate noted.  Check blood cultures x2.  Hold off on antibiotics at this time and follow.  3.  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 Simeon Craft search of oncology who had a prolonged discussion with patient however at that time patient was not convinced that chemotherapy was the best treatment for her.  At that time patient had not made any decision to proceed with treatment.  Patient had followed up on the day of admission with GYN in the outpatient setting however presented to the ED due to worsening shortness of breath.  Will consult with oncology in the morning for further evaluation and recommendations.  4.  Severe protein calorie malnutrition Likely secondary to problem #3.  Patient cachectic, emaciated with temporal wasting and anasarca noted likely secondary to hypoalbuminemia.  Check a pro albumin level.  Will place on nutritional supplementation.  5.  Anemia Check an anemia panel.  Follow H&H.  Transfuse for hemoglobin less than 7.  6.  Chronic thrombocytosis Consult with oncology in the morning.  Place on Lovenox for DVT prophylaxis.  May need to be placed on aspirin however will defer to oncology.    DVT  prophylaxis: Lovenox Code Status: Stepdown unit Family Communication: Updated patient.  No family at bedside. Disposition Plan: To be determined. Consults called: Palliative care consulted/oncology informed via epic Admission status: Admit to stepdown unit.   Irine Seal MD [facility] Pager [phone]  If 7PM-7AM, please contact night-coverage [email or web address] Password [facility]  02/15/2018, 6:59 PM

## 2018-02-15 NOTE — ED Notes (Signed)
Pt transported for paracentesis  ?

## 2018-02-15 NOTE — ED Notes (Signed)
Pt ambulated over 30 feet.  Pt's heart rate at 136 while ambulating.  Pt's RR were 27 to 36.   Pt did not walk with a steady gait. PT STATED I FEEL A LITTLE BETTER.  WHEN ASKED WHAT IS BETTER THEY COULD NOT EXPLAIN. Could not get an O2 sat on pt.

## 2018-02-15 NOTE — Progress Notes (Addendum)
   Subjective:    Patient ID: Tracy Flynn, female    DOB: [birth year], 62 y.o.   MRN: [id number]  HPI  62 yo single P1 here for abnormal CT and u/s findings done during her hospital admission recently. A very enlarged uterus with a thick endometrium was seen as well as a complex right ovarian cyst. Ascites is also noted.  She was seen by gyn onc during her hospital admission and was told that she would need chemotherapy. She did not follow up. She didn't mention any of this to me during our conversation. I discovered this when the gyn onc secretary called back to tell me this (after I put in an order for an appt with gyn onc).  Review of Systems     Objective:   Physical Exam  Breathing, conversing normally Cachetic Black female, no apparent distress Appears to have some difficulty breathing with exertion. Cerix- prolapse, cervical polyp noted and easily removed I did an EMBX with 3 passes. Please note that the uterus was so large that the pipelle's entire length was in the uterus. I seemed to get a mucoid material with all 3 passes. I am not sure how much tissue was in the specimen. She tolerated the procedure well.     Assessment & Plan:  Probable endometrial cancer with ovarian likely as well Check CA-125 Refer to gyn onc Await pathology results

## 2018-02-15 NOTE — ED Provider Notes (Signed)
[facility] DEPT Provider Note   CSN: [id number] Arrival date & time: 02/15/18  1108     History   Chief Complaint Chief Complaint  Patient presents with  . Shortness of Breath  . Weakness    HPI Tracy Flynn is a 62 y.o. female.  HPI  Patient presents with concern of weakness, dyspnea, abdominal discomfort. Patient has multiple medical issues including ongoing evaluation for possible ovarian malignancy.  Patient had biopsy performed today, and presents for evaluation given the persistency of the after mentioned symptoms.  When she has had these for some time, and this is an acute worsening that occurred over the past day or so. She is here with her brother who assists with the HPI. Patient notes that over the past few months she has felt poorly, has been seen multiple times, has had prior thoracentesis, prior paracentesis. She notes that the symptoms today are similar to those, though more severe than usual. Dyspnea is much worse with ambulation, without falling, syncope. Symptoms are minimally better at rest. Biopsy was performed today, but she has no idea of the results, nor is she conversed with her oncologist about therapeutic options.  Past Medical History:  Diagnosis Date  . Pleural effusion   . Uterine fibroid     Patient Active Problem List   Diagnosis Date Noted  . Cancer, metastatic ([place])   . Goals of care, counseling/discussion   . Palliative care encounter   . Recurrent pleural effusion on left 01/13/2018  . Anasarca 01/12/2018  . Severe protein-calorie malnutrition ([place]) 01/12/2018  . Normochromic normocytic anemia 01/12/2018  . Cyst of ovary   . 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 Mesenteric lymphadenopathy 11/27/2017  . Liver mass 11/27/2017  . Sinus tachycardia 11/27/2017  . Pleural effusion on left 11/27/2017  . Pleural effusion 11/27/2017  . Anemia 11/27/2017  . Thrombocytosis ([place]) 11/27/2017    Past Surgical History:    Procedure Laterality Date  . HERNIA REPAIR    . IR PARACENTESIS  01/15/2018  . IR THORACENTESIS ASP PLEURAL SPACE W/IMG GUIDE  12/11/2017  . IR THORACENTESIS ASP PLEURAL SPACE W/IMG GUIDE  12/29/2017  . IR THORACENTESIS ASP PLEURAL SPACE W/IMG GUIDE  01/13/2018     OB History   None      Home Medications    Prior to Admission medications   Medication Sig Start Date End Date Taking? Authorizing Provider  cetirizine (ZYRTEC) 10 MG tablet Take 1 tablet (10 mg total) by mouth daily. Patient taking differently: Take 10 mg by mouth daily as needed for allergies.  12/10/17  Yes Brayton Caves, PA-C  ferrous sulfate 325 (65 FE) MG tablet Take 1 tablet (325 mg total) by mouth 2 (two) times daily with a meal. 01/16/18  Yes Georgette Shell, MD  folic acid (FOLVITE) 1 MG tablet Take 1 tablet (1 mg total) by mouth daily. 01/17/18  Yes Georgette Shell, MD  furosemide (LASIX) 20 MG tablet Take 20 mg by mouth daily.   Yes [provider]  ibuprofen (ADVIL,MOTRIN) 200 MG tablet Take 400 mg by mouth daily as needed for moderate pain.   Yes [provider]  OVER THE COUNTER MEDICATION Apply 1 application topically 2 (two) times daily as needed (pain).   Yes [provider]  potassium chloride (K-DUR) 10 MEQ tablet Take 1 tablet (10 mEq total) by mouth daily. 01/16/18  Yes Georgette Shell, MD  vitamin B-12 100 MCG tablet Take 1 tablet (100 mcg  total) by mouth daily. 01/17/18  Yes Georgette Shell, MD    Family History Family History  Problem Relation Age of Onset  . Cancer Father     Social History Social History   Tobacco Use  . Smoking status: Former Research scientist (life sciences)  . Smokeless tobacco: Never Used  Substance Use Topics  . Alcohol use: Never    Frequency: Never  . Drug use: Never     Allergies   Codeine and Penicillins   Review of Systems Review of Systems  Constitutional:       Per HPI, otherwise negative  HENT:       Per HPI, otherwise negative   Respiratory:       Per HPI, otherwise negative  Cardiovascular:       Per HPI, otherwise negative  Gastrointestinal: Negative for vomiting.  Endocrine:       Negative aside from HPI  Genitourinary:       Neg aside from HPI   Musculoskeletal:       Per HPI, otherwise negative  Skin: Negative.   Allergic/Immunologic: Positive for immunocompromised state.  Neurological: Positive for weakness. Negative for syncope.     Physical Exam Updated Vital Signs BP (!) 137/96   Pulse (!) 104   Temp 97.7 F (36.5 C) (Oral)   Resp (!) 25   SpO2 93%   Physical Exam  Constitutional: She is oriented to person, place, and time. She appears well-developed and well-nourished. No distress.  HENT:  Head: Normocephalic and atraumatic.  Eyes: Conjunctivae and EOM are normal.  Cardiovascular: Normal rate and regular rhythm.  Pulmonary/Chest: Tachypnea noted. She is in respiratory distress. She has decreased breath sounds in the left upper field, the left middle field and the left lower field.  Abdominal: She exhibits distension and ascites. There is tenderness.  Musculoskeletal: She exhibits no edema.  Neurological: She is alert and oriented to person, place, and time. No cranial nerve deficit.  Skin: Skin is warm and dry.  Psychiatric: She has a normal mood and affect.  Nursing note and vitals reviewed.  Patient requires supplemental oxygen, 2 L nasal cannula to give appropriate saturation.  ED Treatments / Results  Labs (all labs ordered are listed, but only abnormal results are displayed) Labs Reviewed  URINE CULTURE - Abnormal; Notable for the following components:      Result Value   Culture 50,000 COLONIES/mL ESCHERICHIA COLI (*)    All other components within normal limits  COMPREHENSIVE METABOLIC PANEL - Abnormal; Notable for the following components:   BUN 28 (*)    Albumin 1.8 (*)    Alkaline Phosphatase 175 (*)    All other components within normal limits  CBC WITH  DIFFERENTIAL/PLATELET - Abnormal; Notable for the following components:   WBC 15.7 (*)    Hemoglobin 9.7 (*)    HCT 31.7 (*)    MCH 24.3 (*)    RDW 20.6 (*)    Platelets 932 (*)    Neutro Abs 14.1 (*)    Lymphs Abs 0.6 (*)    All other components within normal limits  TROPONIN I - Abnormal; Notable for the following components:   Troponin I 0.08 (*)    All other components within normal limits  URINALYSIS, ROUTINE W REFLEX MICROSCOPIC - Abnormal; Notable for the following components:   Color, Urine AMBER (*)    APPearance HAZY (*)    Ketones, ur 5 (*)    Leukocytes, UA LARGE (*)    WBC, UA >  50 (*)    Bacteria, UA RARE (*)    All other components within normal limits  MAGNESIUM - Abnormal; Notable for the following components:   Magnesium 2.5 (*)    All other components within normal limits  CBC WITH DIFFERENTIAL/PLATELET - Abnormal; Notable for the following components:   WBC 17.8 (*)    RBC 3.62 (*)    Hemoglobin 8.5 (*)    HCT 28.1 (*)    MCV 77.6 (*)    MCH 23.5 (*)    RDW 20.9 (*)    Platelets 793 (*)    Neutro Abs 15.8 (*)    All other components within normal limits  COMPREHENSIVE METABOLIC PANEL - Abnormal; Notable for the following components:   Glucose, Bld 118 (*)    BUN 30 (*)    Calcium 8.4 (*)    Albumin 1.5 (*)    Alkaline Phosphatase 138 (*)    All other components within normal limits  IRON AND TIBC - Abnormal; Notable for the following components:   Iron 9 (*)    TIBC 129 (*)    Saturation Ratios 7 (*)    All other components within normal limits  FERRITIN - Abnormal; Notable for the following components:   Ferritin 450 (*)    All other components within normal limits  PREALBUMIN - Abnormal; Notable for the following components:   Prealbumin 7.2 (*)    All other components within normal limits  COMPREHENSIVE METABOLIC PANEL - Abnormal; Notable for the following components:   Glucose, Bld 106 (*)    BUN 27 (*)    Calcium 8.2 (*)    Total  Protein 6.0 (*)    Albumin 2.6 (*)    All other components within normal limits  CBC WITH DIFFERENTIAL/PLATELET - Abnormal; Notable for the following components:   WBC 15.6 (*)    RBC 3.03 (*)    Hemoglobin 7.2 (*)    HCT 23.7 (*)    MCH 23.8 (*)    RDW 20.9 (*)    Platelets 706 (*)    Neutro Abs 13.6 (*)    Monocytes Absolute 1.1 (*)    All other components within normal limits  GLUCOSE, CAPILLARY - Abnormal; Notable for the following components:   Glucose-Capillary 56 (*)    All other components within normal limits  BODY FLUID CELL COUNT WITH DIFFERENTIAL - Abnormal; Notable for the following components:   Color, Fluid CREAM (*)    Appearance, Fluid TURBID (*)    Monocyte-Macrophage-Serous Fluid 20 (*)    All other components within normal limits  CULTURE, BLOOD (ROUTINE X 2)  CULTURE, BLOOD (ROUTINE X 2)  MRSA PCR SCREENING  BODY FLUID CULTURE  BRAIN NATRIURETIC PEPTIDE  PATHOLOGIST SMEAR REVIEW  VITAMIN B12  FOLATE  MAGNESIUM  PROTEIN, PLEURAL OR PERITONEAL FLUID  GLUCOSE, PLEURAL OR PERITONEAL FLUID  GLUCOSE, CAPILLARY  CYTOLOGY - NON PAP    EKG EKG Interpretation  Date/Time:  Monday February 15 2018 11:16:56 EDT Ventricular Rate:  102 PR Interval:    QRS Duration: 83 QT Interval:  341 QTC Calculation: 445 R Axis:   6 Text Interpretation:  Sinus tachycardia Probable left atrial enlargement Abnormal R-wave progression, early transition Nonspecific T abnormalities, lateral leads Abnormal ekg Confirmed by Carmin Muskrat [phone]) on 02/15/2018 11:27:05 AM   Radiology Dg Chest 2 View  Result Date: 02/15/2018 CLINICAL DATA:  Weakness, shortness of breath, upper back pain, and abdominal distention and tightness for the past several days. History  of thoracentesis on the left as recently is January 13, 2018. History of ovarian tumor. EXAM: CHEST - 2 VIEW COMPARISON:  Chest x-ray of February 04, 2018 FINDINGS: There has been slight further accumulation of pleural fluid on the  left. Only approximately 10-15% of the left lung remains aerated. There is a small right pleural effusion which is stable. The lung parenchymal on the right is clear. The right heart border and right pulmonary vascularity is unremarkable. No pulmonary edema on the right is observed. IMPRESSION: Large left pleural effusion resulting in only 10-15% of the left lung being aerated. This is slightly more conspicuous than on the previous study. Stable tiny right pleural effusion. Electronically Signed   By: David  Martinique M.D.   On: 02/15/2018 11:46    Procedures Procedures (including critical care time)    Initial Impression / Assessment and Plan / ED Course  I have reviewed the triage vital signs and the nursing notes.  Pertinent labs & imaging results that were available during my care of the patient were reviewed by me and considered in my medical decision making (see chart for details).    After initial evaluation I had a chance to review the patient's chart, notable for prior episodes of thoracentesis, paracentesis, ongoing concern for malignancy, suspicion for ovarian primary.  Subsequently, given the patient's respiratory difficulty arranged for thoracentesis.   Update:, Patient remains dyspneic, awaiting thoracentesis, requiring supplemental oxygen for appropriate oxygenation status, via nasal cannula.  This ill-appearing elderly female presents with dyspnea, abdominal pain.  On patient is in the midst of evaluation for cancer, and today's presentation is consistent with recurrent malignant effusion, with respiratory distress. Patient required arrangement for IR thoracentesis, which was performed. Patient was at the procedure at the end of shift, but on chart review is clear the patient had persistent tachypnea, dyspnea, worse with ambulation, requiring admission for further evaluation, following the urgent procedure itself.    Final Clinical Impressions(s) / ED Diagnoses   Final  diagnoses:  Dyspnea  Ascites  Dyspnea  Ascites  Pleural effusion  CRITICAL CARE Performed by: Carmin Muskrat Total critical care time: 35 minutes Critical care time was exclusive of separately billable procedures and treating other patients. Critical care was necessary to treat or prevent imminent or life-threatening deterioration. Critical care was time spent personally by me on the following activities: development of treatment plan with patient and/or surrogate as well as nursing, discussions with consultants, evaluation of patient's response to treatment, examination of patient, obtaining history from patient or surrogate, ordering and performing treatments and interventions, ordering and review of laboratory studies, ordering and review of radiographic studies, pulse oximetry and re-evaluation of patient's condition.    Carmin Muskrat, MD 02/17/18 [phone]

## 2018-02-15 NOTE — Procedures (Signed)
PROCEDURE SUMMARY:  Successful image-guided left thoracentesis. Yielded 1 liters of dark brown fluid. Patient tolerated procedure well. No immediate complications.  Specimen was not sent for labs. CXR ordered.  Joaquim Nam PA-C 02/15/2018 3:56 PM

## 2018-02-16 ENCOUNTER — Inpatient Hospital Stay (HOSPITAL_COMMUNITY): Payer: Self-pay

## 2018-02-16 DIAGNOSIS — J9601 Acute respiratory failure with hypoxia: Principal | ICD-10-CM

## 2018-02-16 DIAGNOSIS — C561 Malignant neoplasm of right ovary: Secondary | ICD-10-CM

## 2018-02-16 DIAGNOSIS — N390 Urinary tract infection, site not specified: Secondary | ICD-10-CM | POA: Diagnosis present

## 2018-02-16 DIAGNOSIS — R531 Weakness: Secondary | ICD-10-CM

## 2018-02-16 DIAGNOSIS — D509 Iron deficiency anemia, unspecified: Secondary | ICD-10-CM

## 2018-02-16 LAB — URINALYSIS, ROUTINE W REFLEX MICROSCOPIC
BILIRUBIN URINE: NEGATIVE
GLUCOSE, UA: NEGATIVE mg/dL
HGB URINE DIPSTICK: NEGATIVE
Ketones, ur: 5 mg/dL — AB
NITRITE: NEGATIVE
PH: 5 (ref 5.0–8.0)
Protein, ur: NEGATIVE mg/dL
SPECIFIC GRAVITY, URINE: 1.026 (ref 1.005–1.030)
WBC, UA: >50 WBC/hpf — ABNORMAL HIGH (ref 0–5)

## 2018-02-16 LAB — COMPREHENSIVE METABOLIC PANEL
ALK PHOS: 138 U/L — AB (ref 38–126)
ALT: 13 U/L (ref 0–44)
AST: 17 U/L (ref 15–41)
Albumin: 1.5 g/dL — ABNORMAL LOW (ref 3.5–5.0)
Anion gap: 7 (ref 5–15)
BUN: 30 mg/dL — AB (ref 8–23)
CALCIUM: 8.4 mg/dL — AB (ref 8.9–10.3)
CHLORIDE: 104 mmol/L (ref 98–111)
CO2: 26 mmol/L (ref 22–32)
CREATININE: 0.86 mg/dL (ref 0.44–1.00)
Glucose, Bld: 118 mg/dL — ABNORMAL HIGH (ref 70–99)
Potassium: 4.6 mmol/L (ref 3.5–5.1)
Sodium: 137 mmol/L (ref 135–145)
Total Bilirubin: 0.4 mg/dL (ref 0.3–1.2)
Total Protein: 6.8 g/dL (ref 6.5–8.1)

## 2018-02-16 LAB — BODY FLUID CELL COUNT WITH DIFFERENTIAL
Lymphs, Fluid: 64 %
MONOCYTE-MACROPHAGE-SEROUS FLUID: 20 % — AB (ref 50–90)
Neutrophil Count, Fluid: 16 % (ref 0–25)
WBC FLUID: 269 uL (ref 0–1000)

## 2018-02-16 LAB — CBC WITH DIFFERENTIAL/PLATELET
BASOS PCT: 0 %
Basophils Absolute: 0 10*3/uL (ref 0.0–0.1)
EOS ABS: 0 10*3/uL (ref 0.0–0.7)
EOS PCT: 0 %
HCT: 28.1 % — ABNORMAL LOW (ref 36.0–46.0)
HEMOGLOBIN: 8.5 g/dL — AB (ref 12.0–15.0)
Lymphocytes Relative: 8 %
Lymphs Abs: 1.4 10*3/uL (ref 0.7–4.0)
MCH: 23.5 pg — AB (ref 26.0–34.0)
MCHC: 30.2 g/dL (ref 30.0–36.0)
MCV: 77.6 fL — AB (ref 78.0–100.0)
MONO ABS: 0.6 10*3/uL (ref 0.1–1.0)
MONOS PCT: 3 %
Neutro Abs: 15.8 10*3/uL — ABNORMAL HIGH (ref 1.7–7.7)
Neutrophils Relative %: 89 %
Platelets: 793 10*3/uL — ABNORMAL HIGH (ref 150–400)
RBC: 3.62 MIL/uL — AB (ref 3.87–5.11)
RDW: 20.9 % — ABNORMAL HIGH (ref 11.5–15.5)
WBC: 17.8 10*3/uL — ABNORMAL HIGH (ref 4.0–10.5)

## 2018-02-16 LAB — IRON AND TIBC
Iron: 9 ug/dL — ABNORMAL LOW (ref 28–170)
SATURATION RATIOS: 7 % — AB (ref 10.4–31.8)
TIBC: 129 ug/dL — ABNORMAL LOW (ref 250–450)
UIBC: 120 ug/dL

## 2018-02-16 LAB — PROTEIN, PLEURAL OR PERITONEAL FLUID: Total protein, fluid: 3.4 g/dL

## 2018-02-16 LAB — VITAMIN B12: VITAMIN B 12: 624 pg/mL (ref 180–914)

## 2018-02-16 LAB — FERRITIN: Ferritin: 450 ng/mL — ABNORMAL HIGH (ref 11–307)

## 2018-02-16 LAB — GLUCOSE, PLEURAL OR PERITONEAL FLUID: GLUCOSE FL: 107 mg/dL

## 2018-02-16 LAB — GLUCOSE, CAPILLARY
GLUCOSE-CAPILLARY: 80 mg/dL (ref 70–99)
Glucose-Capillary: 56 mg/dL — ABNORMAL LOW (ref 70–99)

## 2018-02-16 LAB — FOLATE: FOLATE: 14.8 ng/mL (ref >5.9)

## 2018-02-16 LAB — PREALBUMIN: PREALBUMIN: 7.2 mg/dL — AB (ref 18–38)

## 2018-02-16 LAB — PATHOLOGIST SMEAR REVIEW

## 2018-02-16 MED ORDER — LIDOCAINE HCL 1 % IJ SOLN
INTRAMUSCULAR | Status: AC
  Filled 2018-02-16: qty 20

## 2018-02-16 MED ORDER — SODIUM CHLORIDE 0.9 % IV SOLN
2.0000 g | Freq: Every day | INTRAVENOUS | Status: DC
  Administered 2018-02-16 – 2018-02-18 (×3): 2 g via INTRAVENOUS
  Filled 2018-02-16: qty 20
  Filled 2018-02-16: qty 2
  Filled 2018-02-16: qty 20
  Filled 2018-02-16 (×2): qty 2

## 2018-02-16 MED ORDER — ALBUMIN HUMAN 25 % IV SOLN
25.0000 g | Freq: Four times a day (QID) | INTRAVENOUS | Status: DC
  Administered 2018-02-16 – 2018-02-17 (×5): 25 g via INTRAVENOUS
  Filled 2018-02-16: qty 100
  Filled 2018-02-16: qty 50
  Filled 2018-02-16 (×4): qty 100

## 2018-02-16 NOTE — Consult Note (Signed)
Tracy Flynn  XKG:[id number] DOB: 12-04-1955 DOA: 02/15/2018 PCP: Antony Blackbird, MD    Reason for Consult / Chief Complaint:  Shortness of breath and recurrent malignant effusion.   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  As of July 2019 had refused neoadjuvant chemotherapy due to perceived possible side effects.  Has had at least 3 ER visits since, with significant functional, physical, and performance status decline as well as recurrent malignant left pleural effusion.  She was admitted on 8/26 with approximately 1 week history of shortness of breath, and worsening lower extremity edema with abdominal distention and tightness.  There is also report of chills.  In the emergency room she was found to have leukocytosis at 15.7, large left pleural effusion and small right effusion; therapeutic thoracentesis by interventional radiology on that day yielding 1 L of hazy, tan pleural fluid.  Post-thoracentesis film did not show improvement and during ambulation she became tachycardic and tachypneic, could not walk over 30 feet therefore she was admitted to the hospitalist service.  Critical care asked to evaluate given her recurrent pleural effusion and concern for risk of clinical decline  8/27: Seen in consult by oncology.  Extensive discussion completed.  Family continues to decline chemotherapy, and at this point given decline in performance status, weakness, and protein calorie malnutrition oncology feels not a candidate for treatment at this point.  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  Recurrent left  pleural effusion. Grossly exudate. Likely malignancy but can't r/o infection Ascites Dyspnea Leukocytosis  Severe protein calorie malnutrition    Discussion Recurrent malignant effusion. Little to offer here as it will continue to reoccur w/out therapy and I agree the window for therapy has likely slipped away given her malnutrition and poor performance status. She would be a candidate for left pleur-x tube which would help as far as decreasing the respiratory burden from the effusion. Unfortunately if the ascites is also malignant she will still have that to contend with. At any rate she would be best served by palliative focus in care and we will consult palliative to discuss goals of care. If pleur-x were placed this would likely be permanent and would be palliative in purpose.   Plan/rec  For paracentesis today Palliative care consult Consider IR consult for Pleur-x depending on goals of care discussion results.    Best Practice / Goals of Care / Disposition.   DVT prophylaxis: LMWH GI prophylaxis: na Diet: reg Mobility: w/ assist  Code Status: full code  Family Communication:   Disposition / Summary of Today's Plan 02/16/18   Recurrent malignant left effusion.  She desperately leads goals of care discussion, and is appropriate for palliative care.  It would be reasonable to offer Pleurx tube on the left for palliative management of recurrent effusion especially as she is declined chemotherapy.  We will be available as needed, will defer further discussion to palliative and oncology.  Would consider repeat consultation to interventional radiology pending results of palliative care meeting  Consultants:  Pulmonary IR   Procedures: 6/7: (prior thora) + malignant cells  7/24 (prior admit) left thoracentesis + malignant cells 8/26:  left thora (grossly c/w exudate) 1 liter in volume.   Significant Diagnostic Tests:   Micro Data:   Antimicrobials:  Rocephin  8/27>>>   Objective    Examination: General: Frail 62 year old female, appears malnourished and chronically/critically ill HENT: Marked temporal wasting mucous membranes moist no JVD Lungs: Remarkably diminished on the left no accessory use Cardiovascular: Regular rate and rhythm Abdomen: Distended, shifting dullness to percussion, positive bowel sounds Extremities: Diffuse anasarca Neuro: Awake alert no focal deficits GU: Voiding  Blood pressure 126/79, pulse 94, temperature 98 F (36.7 C), temperature source Oral, resp. rate (Abnormal) 26, height 5\' 11"  (1.803 m), weight 79.2 kg, SpO2 100 %.        Intake/Output Summary (Last 24 hours) at 02/16/2018 1108 Last data filed at 02/15/2018 2343 Gross per 24 hour  Intake no documentation  Output 100 ml  Net -100 ml   Filed Weights   02/15/18 2030 02/16/18 0500  Weight: 80 kg 79.2 kg     Labs   CBC: Recent Labs  Lab 02/15/18 1123 02/16/18 0300  WBC 15.7* 17.8*  NEUTROABS 14.1* 15.8*  HGB 9.7* 8.5*  HCT 31.7* 28.1*  MCV 79.4 77.6*  PLT 932* 017*   Basic Metabolic Panel: Recent Labs  Lab 02/15/18 1123 02/15/18 1216 02/16/18 0300  NA 135  --  137  K 4.4  --  4.6  CL 101  --  104  CO2 24  --  26  GLUCOSE 99  --  118*  BUN 28*  --  30*  CREATININE 0.89  --  0.86  CALCIUM 9.0  --  8.4*  MG  --  2.5*  --    GFR: Estimated Creatinine Clearance: 75.8 mL/min (by C-G formula based on SCr of 0.86 mg/dL). Recent Labs  Lab 02/15/18 1123 02/16/18 0300  WBC 15.7* 17.8*   Liver Function Tests: Recent Labs  Lab 02/15/18 1123 02/16/18 0300  AST 33 17  ALT 15 13  ALKPHOS 175* 138*  BILITOT 0.7 0.4  PROT 7.9 6.8  ALBUMIN 1.8* 1.5*   No results for input(s): LIPASE, AMYLASE in the last 168 hours. No results for input(s): AMMONIA in the last 168 hours. ABG    Component Value Date/Time   TCO2 24 02/04/2018 1000    Coagulation Profile: No results for input(s): INR, PROTIME in the last 168 hours. Cardiac  Enzymes: Recent Labs  Lab 02/15/18 1123  TROPONINI 0.08*   HbA1C: No results found for: HGBA1C CBG: No results for input(s): GLUCAP in the last 168 hours.   Review of Systems:    General: No fever, chills.  Does have diffuse weakness HEENT: No headache, nasal congestion, sore throat pulmonary: Does have shortness of breath, this is improved.  No chest pain no cough cardiac: No chest pain palpitations does have orthopnea extremities: Focal weakness was absent, bilateral lower extremities are weak due to edema no significant pain abdomen: Reports adequate appetite, no nausea vomiting diarrhea.  Has had weight loss.  GU: No dysuria piquancy or hesitancy neuro: No headaches, gait disturbance, memory loss or falls. Past medical history   Past Medical History:  Diagnosis Date  . Pleural effusion   . Uterine fibroid    Social History   Social History   Socioeconomic History  . Marital status: Single    Spouse name: Not on file  . Number of children: Not on file  . Years of education: Not on file  . Highest education level: Not on file  Occupational History  .  Not on file  Social Needs  . Financial resource strain: Not on file  . Food insecurity:    Worry: Not on file    Inability: Not on file  . Transportation needs:    Medical: Not on file    Non-medical: Not on file  Tobacco Use  . Smoking status: Former Research scientist (life sciences)  . Smokeless tobacco: Never Used  Substance and Sexual Activity  . Alcohol use: Never    Frequency: Never  . Drug use: Never  . Sexual activity: Not Currently  Lifestyle  . Physical activity:    Days per week: Not on file    Minutes per session: Not on file  . Stress: Not on file  Relationships  . Social connections:    Talks on phone: Not on file    Gets together: Not on file    Attends religious service: Not on file    Active member of club or organization: Not on file    Attends meetings of clubs or organizations: Not on file    Relationship status:  Not on file  . Intimate partner violence:    Fear of current or ex partner: Not on file    Emotionally abused: Not on file    Physically abused: Not on file    Forced sexual activity: Not on file  Other Topics Concern  . Not on file  Social History Narrative  . Not on file    Family history    Family History  Problem Relation Age of Onset  . Cancer Father     Allergies Allergies  Allergen Reactions  . Codeine Rash  . Penicillins Rash    Has patient had a PCN reaction causing immediate rash, facial/tongue/throat swelling, SOB or lightheadedness with hypotension: Yes Has patient had a PCN reaction causing severe rash involving mucus membranes or skin necrosis:No Has patient had a PCN reaction that required hospitalization: No Has patient had a PCN reaction occurring within the last 10 years: No If all of the above answers are "NO", then may proceed with Cephalosporin use.     Home meds  Prior to Admission medications   Medication Sig Start Date End Date Taking? Authorizing Provider  cetirizine (ZYRTEC) 10 MG tablet Take 1 tablet (10 mg total) by mouth daily. Patient taking differently: Take 10 mg by mouth daily as needed for allergies.  12/10/17  Yes Brayton Caves, PA-C  ferrous sulfate 325 (65 FE) MG tablet Take 1 tablet (325 mg total) by mouth 2 (two) times daily with a meal. 01/16/18  Yes Georgette Shell, MD  folic acid (FOLVITE) 1 MG tablet Take 1 tablet (1 mg total) by mouth daily. 01/17/18  Yes Georgette Shell, MD  furosemide (LASIX) 20 MG tablet Take 20 mg by mouth daily.   Yes [provider]  ibuprofen (ADVIL,MOTRIN) 200 MG tablet Take 400 mg by mouth daily as needed for moderate pain.   Yes [provider]  OVER THE COUNTER MEDICATION Apply 1 application topically 2 (two) times daily as needed (pain).   Yes [provider]  potassium chloride (K-DUR) 10 MEQ tablet Take 1 tablet (10 mEq total) by mouth daily. 01/16/18  Yes Georgette Shell, MD  vitamin B-12 100 MCG tablet Take 1 tablet (100 mcg total) by mouth daily. 01/17/18  Yes Georgette Shell, MD     LOS: 1 day

## 2018-02-16 NOTE — Evaluation (Signed)
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  Patient presented to the ED with worsening shortness of breath x1 week with worsening lower extremity edema, abdominal distention, chest tightness.  Patient underwent ultrasound-guided thoracentesis in the emergency room where 1 L of fluid removed however no significant clinical improvement and patient was noted to be tachypneic, tachycardia.   Clinical Impression   Pt admitted with the above. Pt currently with functional limitations due to the deficits listed below (see OT Problem List).  Pt will benefit from skilled OT to increase their safety and independence with ADL and functional mobility for ADL to facilitate discharge to venue listed below.      Follow Up Recommendations  SNF;Home health OT;Supervision - Intermittent    Equipment Recommendations  None recommended by OT    Recommendations for Other Services       Precautions / Restrictions        Mobility Bed Mobility               General bed mobility comments: pt sitting EOB  Transfers Overall transfer level: Needs assistance Equipment used: Rolling walker (2 wheeled) Transfers: Sit to/from [facility] Sit to Stand: Min assist Stand pivot transfers: Min assist            Balance Overall balance assessment: Mild deficits observed, not formally tested                                         ADL either performed or assessed with clinical judgement   ADL  Overall ADL's : Needs assistance/impaired Eating/Feeding: Set up;Sitting   Grooming: Minimal assistance;Standing   Upper Body Bathing: Minimal assistance;Sitting   Lower Body Bathing: Moderate assistance;Sit to/from stand;Cueing for sequencing;Cueing for safety   Upper Body Dressing : Minimal assistance;Sitting   Lower Body Dressing: Moderate assistance;Sit to/from stand;Cueing for safety;Cueing for sequencing                 General ADL Comments: pt may benefit from AE due to abdominal edema     Vision Patient Visual Report: No change from baseline       Perception     Praxis      Pertinent Vitals/Pain Pain Assessment: 0-10 Pain Score: 3  Pain Location: general Pain Descriptors / Indicators: Sore Pain Intervention(s): Limited activity within patient's tolerance;Monitored during session     Hand Dominance     Extremity/Trunk Assessment Upper Extremity Assessment Upper Extremity Assessment: Generalized weakness           Communication Communication Communication: No difficulties   Cognition Arousal/Alertness: Awake/alert Behavior During Therapy: WFL for tasks assessed/performed Overall Cognitive Status: Within Functional Limits for tasks assessed                                                Home Living Family/patient expects to be discharged  to:: Private residence   Available Help at Discharge: Family;Friend(s);Available PRN/intermittently Type of Home: House Home Access: Stairs to enter     Home Layout: Multi-level         Bathroom Toilet: Standard     Home Equipment: None          Prior Functioning/Environment Level of Independence: Independent                 OT Problem List: Decreased strength;Decreased activity tolerance;Impaired balance (sitting and/or standing);Decreased safety awareness;Decreased knowledge of use of DME or AE      OT Treatment/Interventions: Self-care/ADL training;DME and/or AE  instruction;Patient/family education;Therapeutic exercise;Therapeutic activities    OT Goals(Current goals can be found in the care plan section) Acute Rehab OT Goals Patient Stated Goal: feel better OT Goal Formulation: With patient Time For Goal Achievement: 03/02/18 Potential to Achieve Goals: Good ADL Goals Pt Will Perform Eating: with modified independence;sitting Pt Will Perform Grooming: with modified independence Pt Will Perform Upper Body Dressing: with modified independence;sitting Pt Will Perform Lower Body Dressing: with modified independence;sit to/from stand Pt Will Transfer to Toilet: with modified independence;regular height toilet Pt Will Perform Toileting - Clothing Manipulation and hygiene: with modified independence;sit to/from stand  OT Frequency: Min 2X/week   Barriers to D/C:               AM-PAC PT "6 Clicks" Daily Activity     Outcome Measure Help from another person eating meals?: A Little Help from another person taking care of personal grooming?: A Little Help from another person toileting, which includes using toliet, bedpan, or urinal?: A Lot Help from another person bathing (including washing, rinsing, drying)?: A Lot Help from another person to put on and taking off regular upper body clothing?: A Little Help from another person to put on and taking off regular lower body clothing?: A Lot 6 Click Score: 15   End of Session Nurse Communication: Mobility status  Activity Tolerance: Patient tolerated treatment well Patient left: in chair;with nursing/sitter in room;with call bell/phone within reach  OT Visit Diagnosis: Unsteadiness on feet (R26.81);Other abnormalities of gait and mobility (R26.89);Muscle weakness (generalized) (M62.81);History of falling (Z91.81)                Time: 2993-7169 OT Time Calculation (min): 22 min Charges:  OT General Charges $OT Visit: 1 Visit OT Evaluation $OT Eval Moderate Complexity: Kerr,  [place]  Betsy Pries 02/16/2018, 2:03 PM

## 2018-02-16 NOTE — Progress Notes (Signed)
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  Patient presented to the ED with worsening shortness of breath x1 week with worsening lower extremity edema, abdominal distention, chest tightness.  Patient underwent ultrasound-guided thoracentesis in the emergency room where 1 L of fluid removed however no significant clinical improvement and patient was noted to be tachypneic, tachycardia.  Patient in the ED was ambulated and noted to be tachypneic as well as tachycardic.  Patient admitted for further work-up.   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  Patient underwent ultrasound-guided thoracentesis while in the ED with 1 L of fluid removed.  Labs not sent.  Patient noted to have prior thoracentesis 01/13/2018 with cytology consistent with malignant cells consistent with  carcinoma.  Patient was seen in consultation during prior hospitalization by oncology and palliative care however at that time did not want chemotherapy.    Patient currently in stepdown unit.  Ultrasound-guided diagnostic and therapeutic paracentesis pending.  Placed on IV albumin every 6 hours.  Placed on Lasix 60 mg IV every 12 hours.  Strict I's and O's.  Daily weights.  Pulmonary and critical care medicine consulted and are following.  Patient with a poor prognosis.  Oncology has also been consulted.  Supportive care.  2.  Leukocytosis Questionable etiology.  May be secondary to UTI.  Urinalysis done worrisome for UTI.  Urine cultures pending.  Blood cultures pending.  Chest x-ray with no acute infiltrate.  Place empirically on IV Rocephin and monitor closely as patient with a history of rash to penicillin.  3.  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 Alvy Bimler of oncology who had a prolonged discussion with patient however at that time patient was not convinced that chemotherapy was the best treatment for her.  At that time patient had not made any decision to proceed with treatment.  Patient had followed up on the day of admission with GYN in the outpatient setting however presented to the ED due to worsening shortness of breath.    Oncology has been consulted.  May need GYN on call to see patient as well.  Defer to oncology.  Follow.    4.  Severe protein calorie malnutrition Likely secondary to problem #3.  Patient cachectic, emaciated with temporal wasting and anasarca noted likely secondary to hypoalbuminemia.    Prealbumin level was 7.2.  Place patient on IV albumin x2 to  3 days while being diuresed.  Nutritional supplementation.    5.    Iron deficiency anemia Anemia panel consistent with iron deficiency anemia.  Hemoglobin at 8.5.  Follow H&H.  Transfusion threshold hemoglobin  less than 7.  May need IV iron.  6.  Chronic thrombocytosis Thrombocytosis slowly trending down.  Continue Lovenox.  May need aspirin however will defer to oncology.  Oncology following.  7.  UTI Urine cultures pending.  Placed on IV Rocephin.     DVT prophylaxis: Lovenox Code Status: Full Family Communication: The patient.  No family at bedside. Disposition Plan: To be determined.   Consultants:   PCCM pending  Oncology: Dr. Alvy Bimler 02/16/2018  Palliative care pending  Procedures:   Chest x-ray 02/15/2018  Antimicrobials:   IV Rocephin 02/16/2018   Subjective: Laying on her side in bed.  Denies any chest pain.  Still with shortness of breath and significant abdominal distention.  No nausea or emesis.  Denies any dysuria.  Patient states was being treated prior to admission for what seems like a UTI.  Objective: Vitals:   02/16/18 0400 02/16/18 0500 02/16/18 0740 02/16/18 0800  BP: 128/79   126/79  Pulse: 94     Resp: (!) 23   (!) 26  Temp:   98 F (36.7 C)   TempSrc:   Oral   SpO2: 100%     Weight:  79.2 kg    Height:        Intake/Output Summary (Last 24 hours) at 02/16/2018 7893 Last data filed at 02/15/2018 2343 Gross per 24 hour  Intake -  Output 100 ml  Net -100 ml   Filed Weights   02/15/18 2030 02/16/18 0500  Weight: 80 kg 79.2 kg    Examination:  General exam: Laying on her side. Respiratory system: No breath sounds noted on the left.  Poor air movement.   Cardiovascular system: Tachycardia.  No murmurs rubs or gallops.  No JVD.  3-4+ bilateral lower extremity edema up to hips.   Gastrointestinal system: Abdomen is significantly distended, softer, nontender to palpation, decreased bowel sounds.  No rebound.  No guarding. Central nervous system: Alert and oriented. No focal neurological deficits. Extremities: Symmetric 5 x 5 power. Skin: No rashes, lesions or ulcers Psychiatry: Judgement and insight appear normal. Mood & affect appropriate.      Data Reviewed: I have personally reviewed following labs and imaging studies  CBC: Recent Labs  Lab 02/15/18 1123 02/16/18 0300  WBC 15.7* 17.8*  NEUTROABS 14.1* 15.8*  HGB 9.7* 8.5*  HCT 31.7* 28.1*  MCV 79.4 77.6*  PLT 932* 810*   Basic Metabolic Panel: Recent Labs  Lab 02/15/18 1123 02/15/18 1216 02/16/18 0300  NA 135  --  137  K 4.4  --  4.6  CL 101  --  104  CO2 24  --  26  GLUCOSE 99  --  118*  BUN 28*  --  30*  CREATININE 0.89  --  0.86  CALCIUM 9.0  --  8.4*  MG  --  2.5*  --    GFR: Estimated Creatinine Clearance: 75.8 mL/min (by C-G formula based on SCr of 0.86 mg/dL). Liver Function Tests: Recent Labs  Lab 02/15/18 1123 02/16/18 0300  AST 33 17  ALT 15 13  ALKPHOS 175* 138*  BILITOT 0.7 0.4  PROT 7.9 6.8  ALBUMIN 1.8* 1.5*   No results for input(s): LIPASE, AMYLASE in the last 168 hours. No results for input(s): AMMONIA in the  last 168 hours. Coagulation Profile: No results for input(s): INR, PROTIME in the last 168 hours. Cardiac Enzymes: Recent Labs  Lab 02/15/18 1123  TROPONINI 0.08*   BNP (last 3 results) No results for input(s): PROBNP in the last 8760 hours. HbA1C: No results for input(s): HGBA1C in the last 72 hours. CBG: No results for input(s): GLUCAP in the last 168 hours. Lipid Profile: No results for input(s): CHOL, HDL, LDLCALC, TRIG, CHOLHDL, LDLDIRECT in the last 72 hours. Thyroid Function Tests: No results for input(s): TSH, T4TOTAL, FREET4, T3FREE, THYROIDAB in the last 72 hours. Anemia Panel: Recent Labs    02/16/18 0300  VITAMINB12 624  FOLATE 14.8  FERRITIN 450*  TIBC 129*  IRON 9*   Sepsis Labs: No results for input(s): PROCALCITON, LATICACIDVEN in the last 168 hours.  Recent Results (from the past 240 hour(s))  MRSA PCR Screening     Status: None   Collection Time: 02/15/18  9:42 PM  Result Value Ref Range Status   MRSA by PCR NEGATIVE NEGATIVE Final    Comment:        The GeneXpert MRSA Assay  (FDA approved for NASAL specimens only), is one component of a comprehensive MRSA colonization surveillance program. It is not intended to diagnose MRSA infection nor to guide or monitor treatment for MRSA infections. Performed at [facility], [place] [address], [place], [place] [address]          Radiology Studies: Dg Chest 1 View  Result Date: 02/15/2018 CLINICAL DATA:  Thoracentesis EXAM: CHEST  1 VIEW COMPARISON:  Earlier today FINDINGS: Decrease in left pleural fluid, although still large volume. No pneumothorax. Low volume but clear right lung. The heart is obscured. IMPRESSION: 1. No evidence of complication from left thoracentesis. 2. Residual pleural fluid remains large volume. Electronically Signed   By: Monte Fantasia M.D.   On: 02/15/2018 16:14   Dg Chest 2 View  Result Date: 02/15/2018 CLINICAL DATA:  Weakness, shortness of breath, upper back pain, and abdominal distention and tightness for the past several days. History of thoracentesis on the left as recently is January 13, 2018. History of ovarian tumor. EXAM: CHEST - 2 VIEW COMPARISON:  Chest x-ray of February 04, 2018 FINDINGS: There has been slight further accumulation of pleural fluid on the left. Only approximately 10-15% of the left lung remains aerated. There is a small right pleural effusion which is stable. The lung parenchymal on the right is clear. The right heart border and right pulmonary vascularity is unremarkable. No pulmonary edema on the right is observed. IMPRESSION: Large left pleural effusion resulting in only 10-15% of the left lung being aerated. This is slightly more conspicuous than on the previous study. Stable tiny right pleural effusion. Electronically Signed   By: David  Martinique M.D.   On: 02/15/2018 11:46   US Thoracentesis Asp Pleural Space W/img Guide  Result Date: 02/15/2018 INDICATION: Recurrent symptomatic pleural effusion; history of metastatic ovarian cancer. Request for  therapeutic thoracentesis. EXAM: ULTRASOUND GUIDED LEFT THORACENTESIS MEDICATIONS: 10 mL 1% lidocaine COMPLICATIONS: None immediate. PROCEDURE: An ultrasound guided thoracentesis was thoroughly discussed with the patient and questions answered. The benefits, risks, alternatives and complications were also discussed. The patient understands and wishes to proceed with the procedure. Written consent was obtained. Ultrasound was performed to localize and mark an adequate pocket of fluid in the left chest. The area was then prepped and draped in the normal sterile fashion. 1% Lidocaine was used for local anesthesia. Under ultrasound  guidance a 6 Fr Safe-T-Centesis catheter was introduced. Thoracentesis was performed. The catheter was removed and a dressing applied. FINDINGS: A total of approximately 1 L of hazy tan fluid was removed. IMPRESSION: Successful ultrasound guided left thoracentesis yielding 1 L of pleural fluid. Read by Candiss Norse, PA-C Electronically Signed   By: Sandi Mariscal M.D.   On: 02/15/2018 17:06        Scheduled Meds: . chlorhexidine  15 mL Mouth Rinse BID  . enoxaparin (LOVENOX) injection  40 mg Subcutaneous Q24H  . ferrous sulfate  325 mg Oral BID WC  . folic acid  1 mg Oral Daily  . furosemide  60 mg Intravenous Q12H  . loratadine  10 mg Oral Daily  . mouth rinse  15 mL Mouth Rinse q12n4p  . sodium chloride flush  3 mL Intravenous Q12H  . cyanocobalamin  100 mcg Oral Daily   Continuous Infusions: . sodium chloride    . albumin human 25 g (02/16/18 0844)  . cefTRIAXone (ROCEPHIN)  IV       LOS: 1 day    Time spent: 40 minutes    Irine Seal, MD [facility] Pager [phone]  If 7PM-7AM, please contact night-coverage [email or web address] Password [facility] 02/16/2018, 9:23 AM

## 2018-02-16 NOTE — Procedures (Signed)
PROCEDURE SUMMARY:  Successful US guided paracentesis from left lateral abdomen.  Yielded 9.1 liters of chylous fluid.  No immediate complications.  Patient tolerated well.   Specimen was sent for labs.  Judie Grieve Aking Klabunde PA-C 02/16/2018 4:36 PM

## 2018-02-16 NOTE — Progress Notes (Signed)
Tracy Flynn   DOB:[birth year]   FA#:[id number]    Assessment & Plan:  Primary GYN malignancy, possible right ovarian cancer versus endometrial cancer, stage IV Previously, I had extensive discussion with the patient and her brother about the role of chemotherapy and they are not interested to pursue this due to perceived side effects of treatment Frankly, with the patient decline in performance status, profound weakness, severe protein calorie malnutrition, I am reluctant to give her chemotherapy now as I felt she is too weak to withstand any form of treatment at this point If the patient wants to pursue chemotherapy, I would recommend tertiary referral center  At this point in time, I feel it is appropriate to refer her to palliative care/hospice as I anticipate prognosis less than 3 months without chemotherapy  Malignant pleural effusion along with malignant ascites Probable metastatic disease to the liver She felt better after thoracentesis. Continue supportive care.  Severe protein calorie malnutrition Related to untreated disease.  She will continue increase oral intake as tolerated  Iron deficiency anemia Recommend consider blood transfusion if needed   Goals of care discussion The patient and her brother had been very resistant to the idea of chemotherapy in the past I recommend palliative care consultation to discuss goals of care/hospice  Discharge planning  Will defer to primary service. I will sign off.  Please call if questions arise Heath Lark, MD 02/16/2018  8:01 AM   Subjective:  Patient was last seen on 01/13/2018 as official consult to discuss the role of neoadjuvant chemotherapy for this patient with locally advanced GYN malignancy.  At the time of evaluation, both the patient and her brother are reluctant to accept chemotherapy due to serious side effects.  Palliative care has been consulted and the patient was noncommittal in terms of treatment or plan of  care. The patient had numerous emergency room visits since then including current admission to the hospital for recurrent pleural effusion.  This morning, when I walked into talk to the patient, she felt that she is not ready to discuss chemotherapy. She felt a bit better since thoracentesis  Objective:  Vitals:   02/16/18 0311 02/16/18 0400  BP:  128/79  Pulse:  94  Resp:  (!) 23  Temp: 98.1 F (36.7 C)   SpO2:  100%     Intake/Output Summary (Last 24 hours) at 02/16/2018 0801 Last data filed at 02/15/2018 2343 Gross per 24 hour  Intake -  Output 100 ml  Net -100 ml    GENERAL:alert, no distress and comfortable.  She looks thin and cachectic.  She is not examined due to patient's preference   Labs:  Lab Results  Component Value Date   WBC 17.8 (H) 02/16/2018   HGB 8.5 (L) 02/16/2018   HCT 28.1 (L) 02/16/2018   MCV 77.6 (L) 02/16/2018   PLT 793 (H) 02/16/2018   NEUTROABS 15.8 (H) 02/16/2018    Lab Results  Component Value Date   NA 137 02/16/2018   K 4.6 02/16/2018   CL 104 02/16/2018   CO2 26 02/16/2018    Studies:  Dg Chest 1 View  Result Date: 02/15/2018 CLINICAL DATA:  Thoracentesis EXAM: CHEST  1 VIEW COMPARISON:  Earlier today FINDINGS: Decrease in left pleural fluid, although still large volume. No pneumothorax. Low volume but clear right lung. The heart is obscured. IMPRESSION: 1. No evidence of complication from left thoracentesis. 2. Residual pleural fluid remains large volume. Electronically Signed   By: Neva Seat.D.  On: 02/15/2018 16:14   Dg Chest 2 View  Result Date: 02/15/2018 CLINICAL DATA:  Weakness, shortness of breath, upper back pain, and abdominal distention and tightness for the past several days. History of thoracentesis on the left as recently is January 13, 2018. History of ovarian tumor. EXAM: CHEST - 2 VIEW COMPARISON:  Chest x-ray of February 04, 2018 FINDINGS: There has been slight further accumulation of pleural fluid on the left.  Only approximately 10-15% of the left lung remains aerated. There is a small right pleural effusion which is stable. The lung parenchymal on the right is clear. The right heart border and right pulmonary vascularity is unremarkable. No pulmonary edema on the right is observed. IMPRESSION: Large left pleural effusion resulting in only 10-15% of the left lung being aerated. This is slightly more conspicuous than on the previous study. Stable tiny right pleural effusion. Electronically Signed   By: David  Martinique M.D.   On: 02/15/2018 11:46   US Thoracentesis Asp Pleural Space W/img Guide  Result Date: 02/15/2018 INDICATION: Recurrent symptomatic pleural effusion; history of metastatic ovarian cancer. Request for therapeutic thoracentesis. EXAM: ULTRASOUND GUIDED LEFT THORACENTESIS MEDICATIONS: 10 mL 1% lidocaine COMPLICATIONS: None immediate. PROCEDURE: An ultrasound guided thoracentesis was thoroughly discussed with the patient and questions answered. The benefits, risks, alternatives and complications were also discussed. The patient understands and wishes to proceed with the procedure. Written consent was obtained. Ultrasound was performed to localize and mark an adequate pocket of fluid in the left chest. The area was then prepped and draped in the normal sterile fashion. 1% Lidocaine was used for local anesthesia. Under ultrasound guidance a 6 Fr Safe-T-Centesis catheter was introduced. Thoracentesis was performed. The catheter was removed and a dressing applied. FINDINGS: A total of approximately 1 L of hazy tan fluid was removed. IMPRESSION: Successful ultrasound guided left thoracentesis yielding 1 L of pleural fluid. Read by Candiss Norse, PA-C Electronically Signed   By: Sandi Mariscal M.D.   On: 02/15/2018 17:06

## 2018-02-16 NOTE — Progress Notes (Signed)
PT Cancellation Note  Patient Details Name: Tracy Flynn MRN: [id number] DOB: 17-Aug-1955   Cancelled Treatment:    Reason Eval/Treat Not Completed: Patient declined, no reason specified - Pt declines mobility at this time, stating for PT to "come back later". When asked reason, pt states that her legs "feel too weak" and she does not feel she can mobilize now.   Julien Girt, PT, DPT  Pager # [phone]    [name] 02/16/2018, 11:22 AM

## 2018-02-16 NOTE — Care Management Note (Signed)
Case Management Note  Patient Details  Name: Tracy Flynn MRN: [id number] Date of Birth: [birth year]  Subjective/Objective:                  62 yo female with recurrent L malignant pleural effusion. Had L thoracentesis done in ED (by IR I am told) with removal of 1 liter. Still has residual large volume L pleural effusion.   Action/Plan: Following for progression and cm needs  Expected Discharge Date:  (unknown)               Expected Discharge Plan:  Home/Self Care  In-House Referral:     Discharge planning Services  CM Consult  Post Acute Care Choice:    Choice offered to:     DME Arranged:    DME Agency:     HH Arranged:    [place] Agency:     Status of Service:  In process, will continue to follow  If discussed at Long Length of Stay Meetings, dates discussed:    Additional Comments:  Leeroy Cha, RN 02/16/2018, 9:32 AM

## 2018-02-17 DIAGNOSIS — Z7189 Other specified counseling: Secondary | ICD-10-CM

## 2018-02-17 DIAGNOSIS — Z515 Encounter for palliative care: Secondary | ICD-10-CM

## 2018-02-17 LAB — COMPREHENSIVE METABOLIC PANEL
ALK PHOS: 113 U/L (ref 38–126)
ALT: 13 U/L (ref 0–44)
AST: 19 U/L (ref 15–41)
Albumin: 2.6 g/dL — ABNORMAL LOW (ref 3.5–5.0)
Anion gap: 8 (ref 5–15)
BILIRUBIN TOTAL: 0.3 mg/dL (ref 0.3–1.2)
BUN: 27 mg/dL — ABNORMAL HIGH (ref 8–23)
CALCIUM: 8.2 mg/dL — AB (ref 8.9–10.3)
CO2: 27 mmol/L (ref 22–32)
CREATININE: 0.74 mg/dL (ref 0.44–1.00)
Chloride: 103 mmol/L (ref 98–111)
GFR calc Af Amer: >60 mL/min (ref >60)
GFR calc non Af Amer: >60 mL/min (ref >60)
GLUCOSE: 106 mg/dL — AB (ref 70–99)
Potassium: 3.6 mmol/L (ref 3.5–5.1)
SODIUM: 138 mmol/L (ref 135–145)
Total Protein: 6 g/dL — ABNORMAL LOW (ref 6.5–8.1)

## 2018-02-17 LAB — MAGNESIUM: MAGNESIUM: 2.2 mg/dL (ref 1.7–2.4)

## 2018-02-17 LAB — CBC WITH DIFFERENTIAL/PLATELET
BASOS PCT: 0 %
Basophils Absolute: 0 10*3/uL (ref 0.0–0.1)
EOS ABS: 0 10*3/uL (ref 0.0–0.7)
Eosinophils Relative: 0 %
HCT: 23.7 % — ABNORMAL LOW (ref 36.0–46.0)
Hemoglobin: 7.2 g/dL — ABNORMAL LOW (ref 12.0–15.0)
Lymphocytes Relative: 5 %
Lymphs Abs: 0.8 10*3/uL (ref 0.7–4.0)
MCH: 23.8 pg — ABNORMAL LOW (ref 26.0–34.0)
MCHC: 30.4 g/dL (ref 30.0–36.0)
MCV: 78.2 fL (ref 78.0–100.0)
MONO ABS: 1.1 10*3/uL — AB (ref 0.1–1.0)
MONOS PCT: 7 %
NEUTROS PCT: 88 %
Neutro Abs: 13.6 10*3/uL — ABNORMAL HIGH (ref 1.7–7.7)
PLATELETS: 706 10*3/uL — AB (ref 150–400)
RBC: 3.03 MIL/uL — ABNORMAL LOW (ref 3.87–5.11)
RDW: 20.9 % — AB (ref 11.5–15.5)
WBC: 15.6 10*3/uL — ABNORMAL HIGH (ref 4.0–10.5)

## 2018-02-17 LAB — CYTOLOGY - PAP: HPV (WINDOPATH): NOT DETECTED

## 2018-02-17 MED ORDER — FUROSEMIDE 10 MG/ML IJ SOLN
40.0000 mg | Freq: Two times a day (BID) | INTRAMUSCULAR | Status: DC
  Administered 2018-02-17 – 2018-02-19 (×5): 40 mg via INTRAVENOUS
  Filled 2018-02-17 (×5): qty 4

## 2018-02-17 MED ORDER — ALBUMIN HUMAN 25 % IV SOLN
25.0000 g | Freq: Four times a day (QID) | INTRAVENOUS | Status: DC
  Administered 2018-02-18 – 2018-02-19 (×5): 25 g via INTRAVENOUS
  Filled 2018-02-17 (×7): qty 100

## 2018-02-17 MED ORDER — ALBUMIN HUMAN 25 % IV SOLN
50.0000 g | Freq: Four times a day (QID) | INTRAVENOUS | Status: AC
  Administered 2018-02-17 – 2018-02-18 (×3): 50 g via INTRAVENOUS
  Filled 2018-02-17: qty 200
  Filled 2018-02-17: qty 100
  Filled 2018-02-17: qty 200

## 2018-02-17 NOTE — Progress Notes (Signed)
Transferred to room 1401 via wheelchair .Report given to [employer].

## 2018-02-17 NOTE — Progress Notes (Signed)
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  Patient presented to the ED with worsening shortness of breath x1 week with worsening lower extremity edema, abdominal distention, chest tightness.  Patient underwent ultrasound-guided thoracentesis in the emergency room where 1 L of fluid removed however no significant clinical improvement and patient was noted to be tachypneic, tachycardia.  Patient in the ED was ambulated and noted to be tachypneic as well as tachycardic.  Patient admitted for further work-up.   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  Patient underwent ultrasound-guided thoracentesis while in the ED with 1 L of fluid removed.  Labs not sent.  Patient noted to have prior thoracentesis 01/13/2018 with cytology consistent with malignant cells consistent with  carcinoma.  Patient was seen in consultation during prior hospitalization by oncology and palliative care however at that time did not want chemotherapy.    Patient currently in stepdown unit.  Ultrasound-guided diagnostic and therapeutic paracentesis completed with 9 L removed. Placed on IV albumin every 6 hours.  Placed on Lasix 60 mg IV every 12 hours. 4 02/17/2018 we will give 50 g of IV albumin since the patient had 9 L removed with paracentesis. Strict I's and O's.  Daily weights.  Pulmonary and critical care medicine consulted and are following.  Patient with a poor prognosis.  Oncology has also been consulted.  Supportive care.  2.  Leukocytosis Questionable etiology.  May be secondary to UTI.  Urinalysis done worrisome for UTI.  Urine cultures pending.  Blood cultures pending.  Chest x-ray with no acute infiltrate.  Place empirically on IV Rocephin and monitor closely as patient with a history of rash to penicillin.  3.  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 Alvy Bimler of oncology who had a prolonged discussion with patient however at that time patient was not convinced that chemotherapy was the best treatment for her.  At that time patient had not made any decision to proceed with treatment.  Patient had followed up on the day of admission with GYN in the outpatient setting however presented to the ED due to worsening shortness of breath.    Oncology has been consulted.  Recommend hospice, patient is coming to the terms.  Palliative care consulted for further assistance in goals of care discussion.  4.  Severe protein calorie malnutrition Likely secondary to problem #3.  Patient cachectic, emaciated with temporal  wasting and anasarca noted likely secondary to hypoalbuminemia.    Prealbumin level was 7.2.  Place patient on IV albumin x2 to 3 days while being diuresed.  Nutritional  supplementation.    5.    Iron deficiency anemia Anemia panel consistent with iron deficiency anemia.  Hemoglobin at 8.5.  Follow H&H.  Transfusion threshold hemoglobin less than 7.  May need IV iron.  6.  Chronic thrombocytosis Thrombocytosis slowly trending down.  Continue Lovenox.  May need aspirin however will defer to oncology.  Oncology following.  7.  UTI Urine cultures pending.  Placed on IV Rocephin.     DVT prophylaxis: Lovenox Code Status: Full Family Communication: The patient.  No family at bedside. Disposition Plan: To be determined.   Consultants:   PCCM pending  Oncology: Dr. Alvy Bimler 02/16/2018  Palliative care pending  Procedures:   Chest x-ray 02/15/2018  Antimicrobials:   IV Rocephin 02/16/2018   Subjective: Sitting at the edge of the bed, feeling better.  Abdominal distention is still present.  No nausea no vomiting.  No fever no chills.  Passing gas.  Objective: Vitals:   02/17/18 1000 02/17/18 1200 02/17/18 1244 02/17/18 1411  BP: (!) 118/58 (!) 114/59 111/76 (!) 125/59  Pulse:   90 98  Resp: (!) 23 (!) 26 16 14   Temp:   98.9 F (37.2 C) 98.4 F (36.9 C)  TempSrc:   Axillary Oral  SpO2: 100% 99% 100% 100%  Weight:      Height:        Intake/Output Summary (Last 24 hours) at 02/17/2018 1414 Last data filed at 02/17/2018 1300 Gross per 24 hour  Intake 1294.95 ml  Output 550 ml  Net 744.95 ml   Filed Weights   02/15/18 2030 02/16/18 0500  Weight: 80 kg 79.2 kg    Examination:  General exam: Laying on her side. Respiratory system: No breath sounds noted on the left.  Poor air movement.   Cardiovascular system: Tachycardia.  No murmurs rubs or gallops.  No JVD.  3-4+ bilateral lower extremity edema up to hips.   Gastrointestinal system: Abdomen is significantly distended, softer, nontender to palpation, decreased bowel sounds.  No rebound.  No guarding. Central nervous system: Alert and oriented. No focal neurological  deficits. Extremities: Symmetric 5 x 5 power. Skin: No rashes, lesions or ulcers Psychiatry: Judgement and insight appear normal. Mood & affect appropriate.     Data Reviewed: I have personally reviewed following labs and imaging studies  CBC: Recent Labs  Lab 02/15/18 1123 02/16/18 0300 02/17/18 0315  WBC 15.7* 17.8* 15.6*  NEUTROABS 14.1* 15.8* 13.6*  HGB 9.7* 8.5* 7.2*  HCT 31.7* 28.1* 23.7*  MCV 79.4 77.6* 78.2  PLT 932* 793* 557*   Basic Metabolic Panel: Recent Labs  Lab 02/15/18 1123 02/15/18 1216 02/16/18 0300 02/17/18 0315  NA 135  --  137 138  K 4.4  --  4.6 3.6  CL 101  --  104 103  CO2 24  --  26 27  GLUCOSE 99  --  118* 106*  BUN 28*  --  30* 27*  CREATININE 0.89  --  0.86 0.74  CALCIUM 9.0  --  8.4* 8.2*  MG  --  2.5*  --  2.2   GFR: Estimated Creatinine Clearance: 81.5 mL/min (by C-G formula based on SCr of 0.74 mg/dL). Liver Function Tests: Recent Labs  Lab 02/15/18 1123 02/16/18 0300 02/17/18 0315  AST 33 17 19  ALT 15 13 13  ALKPHOS 175* 138* 113  BILITOT 0.7 0.4 0.3  PROT 7.9 6.8 6.0*  ALBUMIN 1.8* 1.5* 2.6*   No results for input(s): LIPASE, AMYLASE in the last 168 hours. No results for input(s): AMMONIA in the last 168 hours. Coagulation Profile: No results for input(s): INR, PROTIME in the last 168 hours. Cardiac Enzymes: Recent Labs  Lab 02/15/18 1123  TROPONINI 0.08*   BNP (last 3 results) No results for input(s): PROBNP in the last 8760 hours. HbA1C: No results for input(s): HGBA1C in the last 72 hours. CBG: Recent Labs  Lab 02/16/18 1726 02/16/18 1828  GLUCAP 56* 80   Lipid Profile: No results for input(s): CHOL, HDL, LDLCALC, TRIG, CHOLHDL, LDLDIRECT in the last 72 hours. Thyroid Function Tests: No results for input(s): TSH, T4TOTAL, FREET4, T3FREE, THYROIDAB in the last 72 hours. Anemia Panel: Recent Labs    02/16/18 0300  VITAMINB12 624  FOLATE 14.8  FERRITIN 450*  TIBC 129*  IRON 9*   Sepsis  Labs: No results for input(s): PROCALCITON, LATICACIDVEN in the last 168 hours.  Recent Results (from the past 240 hour(s))  Culture, blood (Routine X 2) w Reflex to ID Panel     Status: None (Preliminary result)   Collection Time: 02/15/18  9:07 PM  Result Value Ref Range Status   Specimen Description   Final    BLOOD BLOOD RIGHT FOREARM Performed at [facility] [address] [place], [place] [address]    Special Requests   Final    BOTTLES DRAWN AEROBIC ONLY Blood Culture results may not be optimal due to an inadequate volume of blood received in culture bottles Performed at [facility] [address], [place], [place] [address]    Culture   Final    NO GROWTH 2 DAYS Performed at [facility] [address], [place], [place] [address]    Report Status PENDING  Incomplete  Culture, blood (Routine X 2) w Reflex to ID Panel     Status: None (Preliminary result)   Collection Time: 02/15/18  9:07 PM  Result Value Ref Range Status   Specimen Description   Final    BLOOD LEFT ANTECUBITAL Performed at [facility] [address], [place], [place] [address]    Special Requests   Final    BOTTLES DRAWN AEROBIC ONLY Blood Culture results may not be optimal due to an inadequate volume of blood received in culture bottles Performed at [facility] [address], [place], [place] [address]    Culture   Final    NO GROWTH 2 DAYS Performed at [facility] [address], [place], [place] [address]    Report Status PENDING  Incomplete  MRSA PCR Screening     Status: None   Collection Time: 02/15/18  9:42 PM  Result Value Ref Range Status   MRSA by PCR NEGATIVE NEGATIVE Final    Comment:        The GeneXpert MRSA Assay (FDA approved for NASAL specimens only), is one component of a comprehensive MRSA colonization surveillance program. It is not intended to diagnose MRSA infection nor to  guide or monitor treatment for MRSA infections. Performed at [facility], [place] [address] [place],  [address]   Culture, Urine     Status: Abnormal (Preliminary result)   Collection Time: 02/16/18  4:27 AM  Result Value Ref Range Status   Specimen Description   Final  URINE, CLEAN CATCH Performed at [facility], [place] [address], [place], [place] [address]    Special Requests   Final    NONE Performed at [facility], [place] [address], [place], [place] [address]    Culture 50,000 COLONIES/mL ESCHERICHIA COLI (A)  Final   Report Status PENDING  Incomplete  Body fluid culture     Status: None (Preliminary result)   Collection Time: 02/16/18  5:36 PM  Result Value Ref Range Status   Specimen Description   Final    PERITONEAL Performed at [facility] [address] [place], [place] [address]    Special Requests   Final    NONE Performed at [facility], [place] [address], [place], [place] [address]    Gram Stain   Final    FEW WBC PRESENT, PREDOMINANTLY MONONUCLEAR NO ORGANISMS SEEN    Culture   Final    NO GROWTH < 24 HOURS Performed at [facility] [address], [place], [place] [address]    Report Status PENDING  Incomplete         Radiology Studies: Dg Chest 1 View  Result Date: 02/15/2018 CLINICAL DATA:  Thoracentesis EXAM: CHEST  1 VIEW COMPARISON:  Earlier today FINDINGS: Decrease in left pleural fluid, although still large volume. No pneumothorax. Low volume but clear right lung. The heart is obscured. IMPRESSION: 1. No evidence of complication from left thoracentesis. 2. Residual pleural fluid remains large volume. Electronically Signed   By: Monte Fantasia M.D.   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 Request for diagnostic therapeutic paracentesis. EXAM:  ULTRASOUND GUIDED PARACENTESIS MEDICATIONS: 1% lidocaine 10 mL COMPLICATIONS: None immediate. PROCEDURE: Informed written consent was obtained from the patient after a discussion of the risks, benefits and alternatives to treatment. A timeout was performed prior to the initiation of the procedure. Initial ultrasound scanning demonstrates a large amount of ascites within the left lower abdominal quadrant. The left lower abdomen was prepped and draped in the usual sterile fashion. 1% lidocaine with epinephrine was used for local anesthesia. Following this, a 19 gauge, 7-cm, Yueh catheter was introduced. An ultrasound image was saved for documentation purposes. The paracentesis was performed. The catheter was removed and a dressing was applied. The patient tolerated the procedure well without immediate post procedural complication. FINDINGS: A total of approximately 9.1 L of chylous ascites was removed. Samples were sent to the laboratory as requested by the clinical team. IMPRESSION: Successful ultrasound-guided paracentesis yielding 9.1 liters of peritoneal fluid. Read by: Gareth Eagle, PA-C Electronically Signed   By: Lucrezia Europe M.D.   On: 02/16/2018 17:03   US Thoracentesis Asp Pleural Space W/img Guide  Result Date: 02/15/2018 INDICATION: Recurrent symptomatic pleural effusion; history of metastatic ovarian cancer. Request for therapeutic thoracentesis. EXAM: ULTRASOUND GUIDED LEFT THORACENTESIS MEDICATIONS: 10 mL 1% lidocaine COMPLICATIONS: None immediate. PROCEDURE: An ultrasound guided thoracentesis was thoroughly discussed with the patient and questions answered. The benefits, risks, alternatives and complications were also discussed. The patient understands and wishes to proceed with the procedure. Written consent was obtained. Ultrasound was performed to localize and mark an adequate pocket of fluid in the left chest. The area was then prepped and draped in the normal sterile fashion. 1% Lidocaine was  used for local anesthesia. Under ultrasound guidance a 6 Fr Safe-T-Centesis catheter was introduced. Thoracentesis was performed. The catheter was removed and  a dressing applied. FINDINGS: A total of approximately 1 L of hazy tan fluid was removed. IMPRESSION: Successful ultrasound guided left thoracentesis yielding 1 L of pleural fluid. Read by Candiss Norse, PA-C Electronically Signed   By: Sandi Mariscal M.D.   On: 02/15/2018 17:06        Scheduled Meds: . chlorhexidine  15 mL Mouth Rinse BID  . enoxaparin (LOVENOX) injection  40 mg Subcutaneous Q24H  . ferrous sulfate  325 mg Oral BID WC  . folic acid  1 mg Oral Daily  . furosemide  40 mg Intravenous BID  . loratadine  10 mg Oral Daily  . mouth rinse  15 mL Mouth Rinse q12n4p  . sodium chloride flush  3 mL Intravenous Q12H  . cyanocobalamin  100 mcg Oral Daily   Continuous Infusions: . sodium chloride Stopped (02/17/18 1143)  . [START ON 02/18/2018] albumin human    . albumin human Stopped (02/17/18 1227)  . cefTRIAXone (ROCEPHIN)  IV Stopped (02/17/18 1056)     LOS: 2 days    Time spent: 40 minutes    Berle Mull, MD [facility] If 7PM-7AM, please contact night-coverage [email or web address] Password [facility] 02/17/2018, 2:14 PM

## 2018-02-17 NOTE — Consult Note (Signed)
Consultation Note Date: 02/17/2018   Patient Name: Tracy Flynn  DOB: 30-Oct-1955  MRN: [id number]  Age / Sex: 62 y.o., female  PCP: Antony Blackbird, MD Referring Physician: Lavina Hamman, MD  Reason for Consultation: Establishing goals of care  HPI/Patient Profile: 62 y.o. 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 She has had 1L left thoracentesis 8/26 and 9L paracentesis on 8/27 as well as receiving diuresis and treatment for possible UTI.   Clinical Assessment and Goals of Care: I had a good visit today with Gearldine (who I met ~1 months ago) along with her son, Marshell Levan, this time. They both have full awareness that Levi is nearing death and are clear they have no desire to pursue any type of chemotherapy and that their focus is on continuing to live fully each day while she can.   They believe that we all die and they have no fears or concerns about death or the dying process as they recognize this is a natural part of life. With this belief I did confirm that given her goal "to focus on living" that she would not want resuscitation or life support and her son confirmed this and I have placed DNR order. They explain more about their belief system which is [place] and they believe in [place] but they also believe in consciousness and a deeper meaning in life. This impacts their world views and views on her health. This is why they do not believe in worrying about the future as they do not see the need or help in worrying and have already accepted what is to come in the future - death. Because of this belief and their views Marshell Levan explained that his mother's answers to some of our questions are not  always clear to [place] from a medical perspective. It was helpful to have him part of this conversation today.   We also spoke further about maintaining QOL. We discussed PleurX as well as hospice. She is currently not interested in hospice so we discussed why hospice could be useful to her in the future and that all she has to do is contact any of her providers to help arrange if she changes her mind. I learned at my last visit that Sorayah is an extremely private person and a lot of her own family does not know her diagnosis so I imagine that this is impacting her decision to not have hospice at this time. She did not seem closed off to the possibility of hospice in the future. Marshell Levan supports his mothers decisions and wants to help her "to do this her way" and what she feels is right for her. She did not seem sold on PleurX drain at this time and we also discussed how and why this could be helpful to manage her symptoms and QOL (although  if this is pursued she would need hospice nurses to help manage).   Primary Decision Maker PATIENT (helpful to have son involved in conversations)    SUMMARY OF RECOMMENDATIONS   - DNR decided - She has opted at this time not to pursue hospice (I imagine this is because she is an extremely private person) - Will need further discussion regarding PleurX if indicated but I feel she would need hospice to help manage at home if this is pursued  Code Status/Advance Care Planning:  DNR   Symptom Management:   Relief from thoracentesis and paracentesis.   She indicated trouble sleeping but wants to see if this is improved after having fluid removed.   Denies pain.   Appetite is good and she ate all of her lunch except for part of her side salad. Denies any change in appetite and son confirms.   Receiving albumin for BLE anasarca which is not new.   Palliative Prophylaxis:   Bowel Regimen, Delirium Protocol, Frequent Pain Assessment and Turn  Reposition  Psycho-social/Spiritual:   Desire for further Chaplaincy support:no  Additional Recommendations: Education on Hospice  Prognosis:   < 3 months  Discharge Planning: Anticipate return home. Unsure if she will accept any home services. Would recommend outpatient palliative but she is hesitant.      Primary Diagnoses: Present on Admission: . Acute respiratory failure with hypoxia ([place]) . Ovarian tumor . Thrombocytosis ([place]) . Anasarca . Severe protein-calorie malnutrition ([place]) . Recurrent pleural effusion on left . Cancer, metastatic ([place]) . Malignant pleural effusion: Left . Normochromic normocytic anemia . Ascites . Leukocytosis . Acute lower UTI   I have reviewed the medical record, interviewed the patient and family, and examined the patient. The following aspects are pertinent.  Past Medical History:  Diagnosis Date  . Pleural effusion   . Uterine fibroid    Social History   Socioeconomic History  . Marital status: Single    Spouse name: Not on file  . Number of children: Not on file  . Years of education: Not on file  . Highest education level: Not on file  Occupational History  . Not on file  Social Needs  . Financial resource strain: Not on file  . Food insecurity:    Worry: Not on file    Inability: Not on file  . Transportation needs:    Medical: Not on file    Non-medical: Not on file  Tobacco Use  . Smoking status: Former Research scientist (life sciences)  . Smokeless tobacco: Never Used  Substance and Sexual Activity  . Alcohol use: Never    Frequency: Never  . Drug use: Never  . Sexual activity: Not Currently  Lifestyle  . Physical activity:    Days per week: Not on file    Minutes per session: Not on file  . Stress: Not on file  Relationships  . Social connections:    Talks on phone: Not on file    Gets together: Not on file    Attends religious service: Not on file    Active member of club or organization: Not on file    Attends meetings of  clubs or organizations: Not on file    Relationship status: Not on file  Other Topics Concern  . Not on file  Social History Narrative  . Not on file   Family History  Problem Relation Age of Onset  . Cancer Father    Scheduled Meds: . chlorhexidine  15 mL Mouth Rinse BID  .  enoxaparin (LOVENOX) injection  40 mg Subcutaneous Q24H  . ferrous sulfate  325 mg Oral BID WC  . folic acid  1 mg Oral Daily  . furosemide  40 mg Intravenous BID  . loratadine  10 mg Oral Daily  . mouth rinse  15 mL Mouth Rinse q12n4p  . sodium chloride flush  3 mL Intravenous Q12H  . cyanocobalamin  100 mcg Oral Daily   Continuous Infusions: . sodium chloride Stopped (02/17/18 1025)  . [START ON 02/18/2018] albumin human    . albumin human 50 g (02/17/18 1141)  . cefTRIAXone (ROCEPHIN)  IV Stopped (02/17/18 1056)   PRN Meds:.sodium chloride, acetaminophen **OR** acetaminophen, ibuprofen, levalbuterol, ondansetron **OR** ondansetron (ZOFRAN) IV, senna-docusate, sodium chloride flush, sodium phosphate, sorbitol, traMADol Allergies  Allergen Reactions  . Codeine Rash  . Penicillins Rash    Has patient had a PCN reaction causing immediate rash, facial/tongue/throat swelling, SOB or lightheadedness with hypotension: Yes Has patient had a PCN reaction causing severe rash involving mucus membranes or skin necrosis:No Has patient had a PCN reaction that required hospitalization: No Has patient had a PCN reaction occurring within the last 10 years: No If all of the above answers are "NO", then may proceed with Cephalosporin use.    Review of Systems  Constitutional: Positive for activity change and fatigue. Negative for appetite change.  Respiratory: Positive for shortness of breath.   Gastrointestinal: Positive for abdominal distention.  Neurological: Positive for weakness.  Psychiatric/Behavioral: Positive for sleep disturbance.    Physical Exam  Constitutional: She is oriented to person, place, and  time. She appears well-developed. She appears cachectic. She appears ill.  HENT:  Head: Normocephalic and atraumatic.  Cardiovascular: Normal rate and regular rhythm.  Pulmonary/Chest: Effort normal. No accessory muscle usage. No tachypnea. No respiratory distress.  Abdominal: She exhibits distension.  Neurological: She is alert and oriented to person, place, and time.  Nursing note and vitals reviewed.   Vital Signs: BP 111/76 (BP Location: Left Arm)   Pulse 90   Temp 97.8 F (36.6 C) (Oral)   Resp 16   Ht _0  (1.803 m)   Wt 79.2 kg   SpO2 100%   BMI 24.35 kg/m  Pain Scale: 0-10   Pain Score: 0-No pain   SpO2: SpO2: 100 % O2 Device:SpO2: 100 % O2 Flow Rate: .   IO: Intake/output summary:   Intake/Output Summary (Last 24 hours) at 02/17/2018 1301 Last data filed at 02/17/2018 1026 Gross per 24 hour  Intake 1148.28 ml  Output 550 ml  Net 598.28 ml    LBM: Last BM Date: 02/15/18 Baseline Weight: Weight: 80 kg Most recent weight: Weight: 79.2 kg     Palliative Assessment/Data: 50%     Time In: 1245 Time Out: 1405 Time Total: 80 min  Greater than 50%  of this time was spent counseling and coordinating care related to the above assessment and plan.  Signed by: Vinie Sill, NP Palliative Medicine Team Pager # [phone] (M-F 8a-5p) Team Phone # [phone] (Nights/Weekends)

## 2018-02-17 NOTE — Progress Notes (Signed)
PT Cancellation Note  Patient Details Name: Tracy Flynn MRN: [id number] DOB: June 30, 1955   Cancelled Treatment:     PT attempted but pt declined participation "I just need to rest".  Will follow.   Caisen Mangas 02/17/2018, 10:29 AM

## 2018-02-17 NOTE — Care Management Note (Signed)
Case Management Note  Patient Details  Name: Tracy Flynn MRN: [id number] Date of Birth: [birth year]  Subjective/Objective:                  [id number]-Successful US guided paracentesis from left lateral abdomen.  Yielded 9.1 liters of chylous fluid.  No immediate complications.  Patient tolerated well.  62 year old with stage IV gynecologic cancer (ovarian versus endometrial], patient previously declined chemotherapy Admitted with recurrent malignant pleural effusion, ascites.  Status post thoracentesis yesterday. PCCM consulted for respiratory failure. wcb-15.6, hgb7.2,hct-23.7/ urine culture =gpr/iv ns, iv albumin 25% q6hours, iv rocephone 2 gms q24hrs, Action/Plan: Patient being transferred from SDU to Telemetry bed/following for progression and cm needs.  Expected Discharge Date:  (unknown)               Expected Discharge Plan:  Home/Self Care  In-House Referral:     Discharge planning Services  CM Consult  Post Acute Care Choice:    Choice offered to:     DME Arranged:    DME Agency:     HH Arranged:    HH Agency:     Status of Service:  In process, will continue to follow  If discussed at Long Length of Stay Meetings, dates discussed:    Additional Comments:  Leeroy Cha, RN 02/17/2018, 10:04 AM

## 2018-02-17 NOTE — Progress Notes (Signed)
Patient ID: Tracy Flynn, female   DOB: 1955-10-15, 62 y.o.   MRN: [id number] Aware of request for both left chest Pleurx and tunneled peritoneal drain placement in patient.  Imaging studies have been reviewed by Dr. Annamaria Boots.  He recommends additional 24 hours of antibiotic therapy for UTI prior to tunneled catheter placement.  Left Pleurx catheter could be placed on 8/30 and tunneled peritoneal drain early next week if patient decides to proceed.  At this time she wishes to speak with family members before making final decision to proceed.  Will recheck status tomorrow.

## 2018-02-18 DIAGNOSIS — R0600 Dyspnea, unspecified: Secondary | ICD-10-CM

## 2018-02-18 DIAGNOSIS — Z515 Encounter for palliative care: Secondary | ICD-10-CM

## 2018-02-18 DIAGNOSIS — R0609 Other forms of dyspnea: Secondary | ICD-10-CM

## 2018-02-18 LAB — CBC WITH DIFFERENTIAL/PLATELET
BASOS ABS: 0 10*3/uL (ref 0.0–0.1)
BASOS PCT: 0 %
BASOS PCT: 0 %
Basophils Absolute: 0 10*3/uL (ref 0.0–0.1)
EOS ABS: 0 10*3/uL (ref 0.0–0.7)
Eosinophils Absolute: 0 10*3/uL (ref 0.0–0.7)
Eosinophils Relative: 0 %
Eosinophils Relative: 0 %
HCT: 21.6 % — ABNORMAL LOW (ref 36.0–46.0)
HEMATOCRIT: 21.6 % — AB (ref 36.0–46.0)
HEMOGLOBIN: 6.7 g/dL — AB (ref 12.0–15.0)
Hemoglobin: 6.6 g/dL — CL (ref 12.0–15.0)
LYMPHS PCT: 7 %
Lymphocytes Relative: 6 %
Lymphs Abs: 0.7 10*3/uL (ref 0.7–4.0)
Lymphs Abs: 0.6 10*3/uL — ABNORMAL LOW (ref 0.7–4.0)
MCH: 24 pg — ABNORMAL LOW (ref 26.0–34.0)
MCH: 24.2 pg — ABNORMAL LOW (ref 26.0–34.0)
MCHC: 30.6 g/dL (ref 30.0–36.0)
MCHC: 31 g/dL (ref 30.0–36.0)
MCV: 78.5 fL (ref 78.0–100.0)
MCV: 78 fL (ref 78.0–100.0)
Monocytes Absolute: 0.8 10*3/uL (ref 0.1–1.0)
Monocytes Absolute: 0.9 10*3/uL (ref 0.1–1.0)
Monocytes Relative: 8 %
Monocytes Relative: 9 %
NEUTROS ABS: 8.3 10*3/uL — AB (ref 1.7–7.7)
NEUTROS PCT: 85 %
Neutro Abs: 8.5 10*3/uL — ABNORMAL HIGH (ref 1.7–7.7)
Neutrophils Relative %: 85 %
Platelets: 575 10*3/uL — ABNORMAL HIGH (ref 150–400)
Platelets: 568 10*3/uL — ABNORMAL HIGH (ref 150–400)
RBC: 2.75 MIL/uL — AB (ref 3.87–5.11)
RBC: 2.77 MIL/uL — AB (ref 3.87–5.11)
RDW: 20.7 % — AB (ref 11.5–15.5)
RDW: 20.6 % — ABNORMAL HIGH (ref 11.5–15.5)
WBC: 10.1 10*3/uL (ref 4.0–10.5)
WBC: 9.8 10*3/uL (ref 4.0–10.5)

## 2018-02-18 LAB — COMPREHENSIVE METABOLIC PANEL
ALBUMIN: 3.6 g/dL (ref 3.5–5.0)
ALT: 12 U/L (ref 0–44)
AST: 22 U/L (ref 15–41)
Alkaline Phosphatase: 68 U/L (ref 38–126)
Anion gap: 7 (ref 5–15)
BUN: 20 mg/dL (ref 8–23)
CHLORIDE: 105 mmol/L (ref 98–111)
CO2: 26 mmol/L (ref 22–32)
CREATININE: 0.61 mg/dL (ref 0.44–1.00)
Calcium: 8.4 mg/dL — ABNORMAL LOW (ref 8.9–10.3)
GFR calc Af Amer: >60 mL/min (ref >60)
GFR calc non Af Amer: >60 mL/min (ref >60)
GLUCOSE: 95 mg/dL (ref 70–99)
Potassium: 3.2 mmol/L — ABNORMAL LOW (ref 3.5–5.1)
Sodium: 138 mmol/L (ref 135–145)
Total Bilirubin: 0.5 mg/dL (ref 0.3–1.2)
Total Protein: 5.8 g/dL — ABNORMAL LOW (ref 6.5–8.1)

## 2018-02-18 LAB — ABO/RH: ABO/RH(D): O POS

## 2018-02-18 LAB — RETICULOCYTES
RBC.: 2.77 MIL/uL — ABNORMAL LOW (ref 3.87–5.11)
RETIC COUNT ABSOLUTE: 55.4 10*3/uL (ref 19.0–186.0)
RETIC CT PCT: 2 % (ref 0.4–3.1)

## 2018-02-18 LAB — IRON AND TIBC: IRON: 10 ug/dL — AB (ref 28–170)

## 2018-02-18 LAB — URINE CULTURE

## 2018-02-18 LAB — FIBRINOGEN: FIBRINOGEN: 431 mg/dL (ref 210–475)

## 2018-02-18 LAB — FERRITIN: FERRITIN: 254 ng/mL (ref 11–307)

## 2018-02-18 LAB — PROTIME-INR
INR: 1.25
PROTHROMBIN TIME: 15.6 s — AB (ref 11.4–15.2)

## 2018-02-18 LAB — HEMOGLOBIN AND HEMATOCRIT, BLOOD
HEMATOCRIT: 27.7 % — AB (ref 36.0–46.0)
HEMOGLOBIN: 8.5 g/dL — AB (ref 12.0–15.0)

## 2018-02-18 LAB — VITAMIN B12: VITAMIN B 12: 276 pg/mL (ref 180–914)

## 2018-02-18 LAB — PREPARE RBC (CROSSMATCH)

## 2018-02-18 LAB — FOLATE: Folate: 12.8 ng/mL (ref >5.9)

## 2018-02-18 LAB — MAGNESIUM: Magnesium: 2.1 mg/dL (ref 1.7–2.4)

## 2018-02-18 MED ORDER — POLYETHYLENE GLYCOL 3350 17 G PO PACK
17.0000 g | PACK | Freq: Every day | ORAL | Status: DC
  Administered 2018-02-19 – 2018-02-20 (×2): 17 g via ORAL
  Filled 2018-02-18 (×3): qty 1

## 2018-02-18 MED ORDER — POTASSIUM CHLORIDE CRYS ER 20 MEQ PO TBCR
40.0000 meq | EXTENDED_RELEASE_TABLET | Freq: Once | ORAL | Status: AC
  Administered 2018-02-18: 40 meq via ORAL
  Filled 2018-02-18: qty 2

## 2018-02-18 MED ORDER — SODIUM CHLORIDE 0.9% IV SOLUTION: Freq: Once | INTRAVENOUS | Status: DC

## 2018-02-18 MED ORDER — MAGNESIUM HYDROXIDE 400 MG/5ML PO SUSP
15.0000 mL | Freq: Every day | ORAL | Status: DC
  Administered 2018-02-19 – 2018-02-20 (×2): 15 mL via ORAL
  Filled 2018-02-18 (×3): qty 30

## 2018-02-18 MED ORDER — DOCUSATE SODIUM 100 MG PO CAPS
100.0000 mg | ORAL_CAPSULE | Freq: Two times a day (BID) | ORAL | Status: DC
  Administered 2018-02-18 – 2018-02-20 (×5): 100 mg via ORAL
  Filled 2018-02-18 (×5): qty 1

## 2018-02-18 MED ORDER — LACTULOSE 10 GM/15ML PO SOLN
30.0000 g | Freq: Every day | ORAL | Status: DC
  Administered 2018-02-19 – 2018-02-20 (×2): 30 g via ORAL
  Filled 2018-02-18 (×3): qty 45

## 2018-02-18 MED ORDER — SODIUM CHLORIDE 0.9% IV SOLUTION
Freq: Once | INTRAVENOUS | Status: AC
  Administered 2018-02-18: 14:00:00 via INTRAVENOUS

## 2018-02-18 NOTE — Care Management Note (Signed)
Case Management Note  Patient Details  Name: Florentina Marquart MRN: [id number] Date of Birth: 1956/06/20  Subjective/Objective: Transfer from SDU. Hx: Ovarian Ca. Malignant pleural effusion. Readmit. Palliative care following-recc-DNR,prognosis less 3 months, currently decline hospice, recc otpt palliative services. May need pleurx cath drainage. OT recc SNF. Await PT eval.CSW notified.Noted no health insurance.                   Action/Plan:d/c plan home.   Expected Discharge Date:  (unknown)               Expected Discharge Plan:  Home/Self Care  In-House Referral:     Discharge planning Services  CM Consult  Post Acute Care Choice:    Choice offered to:     DME Arranged:    DME Agency:     HH Arranged:    HH Agency:     Status of Service:  In process, will continue to follow  If discussed at Long Length of Stay Meetings, dates discussed:    Additional Comments:  Dessa Phi, RN 02/18/2018, 12:34 PM

## 2018-02-18 NOTE — Progress Notes (Signed)
Patient ID: Sydell Prowell, female   DOB: February 24, 1956, 62 y.o.   MRN: [id number]  This NP visited patient at the bedside as a follow up to  yesterday's [place] with Vinie Sill NP with PMT.    Patient is sitting up on side of bed, eating breakfast and verbalizes no complaints at this time.  No family at bedside.   This nurse practitioner introduced myself and reason for follow up to [place], no family at bedside.  Offered support and education in decisions regarding possible Pleurx cath placement.  Ms Skalla tells me not made decisions regarding desire for Pleurx.. Discussed the benefits of the Pleurx in regard to symptom management specific to shortness of breath.  We discussed the importance of support in her home if her decision is to move forward with placement of Pleurx catheters.  Differentiated the difference between home health and hospice.  Discussed with patient the importance of continued conversation with family and their  medical providers regarding overall plan of care and treatment options,  ensuring decisions are within the context of the patients values and GOCs.  Questions and concerns addressed   Discussed with Dr  Posey Pronto    PMT will continue to support holistically.    Total time spent on the unit was 25 minutes  Greater than 50% of the time was spent in counseling and coordination of care  Wadie Lessen NP  Palliative Medicine Team Team Phone # [phone] Pager [phone]

## 2018-02-18 NOTE — Progress Notes (Signed)
PT Cancellation Note  Patient Details Name: Symiah Nowotny MRN: [id number] DOB: [birth year]   Cancelled Treatment:    Reason Eval/Treat Not Completed: Medical issues which prohibited therapy(Hgb low and PRBCs ordered)   Cincere Zorn,KATHrine E 02/18/2018, 11:27 AM Carmelia Bake, PT, DPT 02/18/2018 Pager: [phone]

## 2018-02-18 NOTE — Progress Notes (Signed)
Report from [facility]. Care assumed for pt at this time. Pt sitting at eob, eating food son brought in. Son bedside, requesting to speak with MD.  Dr Posey Pronto paged and made aware. Pt with no c/o. PRBC transfusing without difficulty. Assessment unchanged from previous nurse assessment.

## 2018-02-18 NOTE — Progress Notes (Signed)
[facility] Progress Note  Patient: Tracy Flynn SEG:[id number]   PCP: Antony Blackbird, MD DOB: 22-Aug-1955   DOA: 02/15/2018   DOS: 02/18/2018   Date of Service: the patient was seen and examined on 02/18/2018  Subjective: Patient is denying any acute complaints.  Withdrawn not making any eye contact.  No nausea no vomiting.  Unable to get comfortable last night due to constipation.  Passing gas.  No abdominal pain.  No chest pain.  Eating is okay.  Brief hospital course: Pt. 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 Currently further plan is symptom control and treatment of UTI.  Assessment and Plan: 1.  Hypoalbuminemia, third spacing. Recurrent pleural effusion. Malignant ascites. 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 Acute hypoxic respiratory failure secondary to pleural effusion Underwent ultrasound-guided thoracentesis in ED with 1 L fluid removal. Labs were not sent. Last thoracentesis prior to this admission was on 01/13/2018. Pulmonary was consulted recommended Pleurx catheter. Patient placed on IV Lasix 60 mg every 12 hours along with albumin support. Strict ins and outs and daily weight. Also underwent therapeutic paracentesis with 9 L removed of chylous fluid. Received IV albumin support he continues to receive IV albumin for today as well. Renal function appears stable. Abdomen still distended and tight. Patient may benefit from Pleurx catheter as well as peritoneal drainage catheter, interventional radiology consulted patient scheduled for Pleurx during the hospitalization and will most likely get peritoneal drainage catheter placement outpatient.  2.  Goals of care discussion. Appreciate palliative care  assistance. They had extensive discussion with patient yesterday. Also appreciate assistance from oncology. Patient is not a candidate for chemotherapy or any other assistive therapy due to her poor performance status as well as progression of the disease. After discussing with patient's son as well as patient her CODE STATUS was changed from full code to DNR/DNI at the request of the family and patient. If the patient requires Pleurx catheter as well as peritoneal catheter she will require assistance at home most likely from hospice. Patient is still not decided on considering that option but I am highly recommending her to consider the option of this on discharge. She would like to think about it.  3.  UTI. Leukocytosis. Urine culture is growing E. coli. Although suspect that the leukocytosis is more likely stress reaction. Chest x-ray no acute infiltrate. On IV Rocephin we will continue for now until sensitivities are available.  4.  Iron deficiency anemia. Hemoglobin trending down etiology not clear. We will recheck the labs today.  Is a repeat lab for hemoglobin is still less than 7 we will transfuse her 1 unit. So far based on the labs available do not suspect that the patient is hemolyzing. Monitor.  5.  Severe protein calorie malnutrition. Secondary to cancer. Nutrition supplementation. Prealbumin level 7.2.  6.  Chronic thrombocytosis. Stable.  Monitor.   Diet: Regular diet DVT Prophylaxis: subcutaneous Heparin  Advance goals of care discussion: DNR/DNI  Family Communication: no family was present at bedside, at the time of interview.   Disposition:  Discharge to home preferably home hospice.  Consultants: Palliative care, oncology, IR Procedures: Therapeutic paracentesis therapeutic paracentesis.  Antibiotics: Anti-infectives (From admission, onward)   Start     Dose/Rate Route Frequency Ordered Stop   02/16/18 0930  cefTRIAXone (  ROCEPHIN) 2 g in sodium  chloride 0.9 % 100 mL IVPB     2 g 200 mL/hr over 30 Minutes Intravenous Daily 02/16/18 0751         Objective: Physical Exam: Vitals:   02/17/18 1244 02/17/18 1411 02/17/18 2010 02/18/18 0441  BP: 111/76 (!) 125/59 (!) 125/59 (!) 103/56  Pulse: 90 98 (!) 105 89  Resp: 16 14 18 18   Temp: 98.9 F (37.2 C) 98.4 F (36.9 C) 99.5 F (37.5 C) 98.7 F (37.1 C)  TempSrc: Axillary Oral Oral Oral  SpO2: 100% 100% 98%   Weight:      Height:        Intake/Output Summary (Last 24 hours) at 02/18/2018 0831 Last data filed at 02/18/2018 0623 Gross per 24 hour  Intake 1580.9 ml  Output 200 ml  Net 1380.9 ml   Filed Weights   02/15/18 2030 02/16/18 0500  Weight: 80 kg 79.2 kg   General: Alert, Awake and Oriented to Time, Place and Person. Appear in moderate distress, affect blunted Eyes: PERRL, Conjunctiva normal ENT: Oral Mucosa clear moist. Neck: no JVD, no Abnormal Mass Or lumps Cardiovascular: S1 and S2 Present, no Murmur, Peripheral Pulses Present Respiratory: normal respiratory effort, Bilateral Air entry equal and Decreased, no use of accessory muscle, Clear to Auscultation, no Crackles, no wheezes Abdomen: Bowel Sound present, distended, soft and no tenderness, no hernia Skin: on redness, no Rash, no induration Extremities: bilateral  Pedal edema, no calf tenderness Neurologic: Grossly no focal neuro deficit. Bilaterally Equal motor strength  Data Reviewed: CBC: Recent Labs  Lab 02/15/18 1123 02/16/18 0300 02/17/18 0315 02/18/18 0521  WBC 15.7* 17.8* 15.6* 10.1  NEUTROABS 14.1* 15.8* 13.6* 8.5*  HGB 9.7* 8.5* 7.2* 6.6*  HCT 31.7* 28.1* 23.7* 21.6*  MCV 79.4 77.6* 78.2 78.5  PLT 932* 793* 706* 762*   Basic Metabolic Panel: Recent Labs  Lab 02/15/18 1123 02/15/18 1216 02/16/18 0300 02/17/18 0315 02/18/18 0521  NA 135  --  137 138 138  K 4.4  --  4.6 3.6 3.2*  CL 101  --  104 103 105  CO2 24  --  26 27 26   GLUCOSE 99  --  118* 106* 95  BUN 28*  --  30* 27*  20  CREATININE 0.89  --  0.86 0.74 0.61  CALCIUM 9.0  --  8.4* 8.2* 8.4*  MG  --  2.5*  --  2.2 2.1    Liver Function Tests: Recent Labs  Lab 02/15/18 1123 02/16/18 0300 02/17/18 0315 02/18/18 0521  AST 33 17 19 22   ALT 15 13 13 12   ALKPHOS 175* 138* 113 68  BILITOT 0.7 0.4 0.3 0.5  PROT 7.9 6.8 6.0* 5.8*  ALBUMIN 1.8* 1.5* 2.6* 3.6   No results for input(s): LIPASE, AMYLASE in the last 168 hours. No results for input(s): AMMONIA in the last 168 hours. Coagulation Profile: Recent Labs  Lab 02/18/18 0746  INR 1.25   Cardiac Enzymes: Recent Labs  Lab 02/15/18 1123  TROPONINI 0.08*   BNP (last 3 results) No results for input(s): PROBNP in the last 8760 hours. CBG: Recent Labs  Lab 02/16/18 1726 02/16/18 1828  GLUCAP 56* 80   Studies: No results found.  Scheduled Meds: . sodium chloride   Intravenous Once  . chlorhexidine  15 mL Mouth Rinse BID  . docusate sodium  100 mg Oral BID  . enoxaparin (LOVENOX) injection  40 mg Subcutaneous Q24H  . ferrous sulfate  325  mg Oral BID WC  . folic acid  1 mg Oral Daily  . furosemide  40 mg Intravenous BID  . lactulose  30 g Oral Daily  . loratadine  10 mg Oral Daily  . magnesium hydroxide  15 mL Oral Daily  . mouth rinse  15 mL Mouth Rinse q12n4p  . polyethylene glycol  17 g Oral Daily  . potassium chloride  40 mEq Oral Once  . sodium chloride flush  3 mL Intravenous Q12H  . cyanocobalamin  100 mcg Oral Daily   Continuous Infusions: . sodium chloride Stopped (02/17/18 1143)  . albumin human    . cefTRIAXone (ROCEPHIN)  IV Stopped (02/17/18 1056)   PRN Meds: sodium chloride, acetaminophen **OR** acetaminophen, levalbuterol, ondansetron **OR** ondansetron (ZOFRAN) IV, sodium chloride flush, sodium phosphate, sorbitol, traMADol  Time spent: 35 minutes  Author: Berle Mull, MD [facility] Pager: [phone] 02/18/2018 8:31 AM  If 7PM-7AM, please contact night-coverage at [email or web address], password [facility]

## 2018-02-18 NOTE — Progress Notes (Signed)
OT Cancellation Note  Patient Details Name: Tracy Flynn MRN: [id number] DOB: 12-20-1955   Cancelled Treatment:    Reason Eval/Treat Not Completed: Medical issues which prohibited therapy   Medical issues which prohibited therapy(Hgb low and PRBCs ordered) Kari Baars, [place]  Payton Mccallum D 02/18/2018, 12:29 PM

## 2018-02-18 NOTE — Progress Notes (Signed)
IR requested by Dr. Posey Pronto for possible image-guided PleurX catheter insertion in both the left pleural space and peritoneal space.  After multiple discussions with patient and her son, they both decided that patient does not want either procedure at this time.  Discussed with Dr. Barbie Banner who is aware.  IR available in future if needed. Please call IR with questions/concerns.  Bea Graff Ashrith Sagan, PA-C 02/18/2018, 4:08 PM

## 2018-02-19 ENCOUNTER — Inpatient Hospital Stay (HOSPITAL_COMMUNITY): Payer: Self-pay

## 2018-02-19 LAB — CBC
HEMATOCRIT: 28.2 % — AB (ref 36.0–46.0)
HEMOGLOBIN: 8.6 g/dL — AB (ref 12.0–15.0)
MCH: 23.8 pg — AB (ref 26.0–34.0)
MCHC: 30.5 g/dL (ref 30.0–36.0)
MCV: 78.1 fL (ref 78.0–100.0)
Platelets: 600 10*3/uL — ABNORMAL HIGH (ref 150–400)
RBC: 3.61 MIL/uL — AB (ref 3.87–5.11)
RDW: 19.6 % — ABNORMAL HIGH (ref 11.5–15.5)
WBC: 11.2 10*3/uL — ABNORMAL HIGH (ref 4.0–10.5)

## 2018-02-19 LAB — BASIC METABOLIC PANEL
Anion gap: 8 (ref 5–15)
BUN: 16 mg/dL (ref 8–23)
CHLORIDE: 107 mmol/L (ref 98–111)
CO2: 27 mmol/L (ref 22–32)
Calcium: 8.9 mg/dL (ref 8.9–10.3)
Creatinine, Ser: 0.62 mg/dL (ref 0.44–1.00)
GFR calc Af Amer: >60 mL/min (ref >60)
GFR calc non Af Amer: >60 mL/min (ref >60)
Glucose, Bld: 90 mg/dL (ref 70–99)
POTASSIUM: 3.1 mmol/L — AB (ref 3.5–5.1)
SODIUM: 142 mmol/L (ref 135–145)

## 2018-02-19 LAB — BPAM RBC
Blood Product Expiration Date: 201909272359
ISSUE DATE / TIME: 201908291351
Unit Type and Rh: 5100

## 2018-02-19 LAB — TYPE AND SCREEN
ABO/RH(D): O POS
ANTIBODY SCREEN: NEGATIVE
UNIT DIVISION: 0

## 2018-02-19 LAB — HAPTOGLOBIN: Haptoglobin: 246 mg/dL — ABNORMAL HIGH (ref 34–200)

## 2018-02-19 MED ORDER — SODIUM CHLORIDE 0.9 % IV SOLN
510.0000 mg | Freq: Once | INTRAVENOUS | Status: AC
  Administered 2018-02-19: 510 mg via INTRAVENOUS
  Filled 2018-02-19: qty 17

## 2018-02-19 MED ORDER — TORSEMIDE 20 MG PO TABS
40.0000 mg | ORAL_TABLET | Freq: Every day | ORAL | Status: DC
  Administered 2018-02-19 – 2018-02-20 (×2): 40 mg via ORAL
  Filled 2018-02-19 (×2): qty 2

## 2018-02-19 MED ORDER — POTASSIUM CHLORIDE CRYS ER 10 MEQ PO TBCR
40.0000 meq | EXTENDED_RELEASE_TABLET | ORAL | Status: AC
  Administered 2018-02-19 (×2): 40 meq via ORAL
  Filled 2018-02-19: qty 2
  Filled 2018-02-19: qty 4

## 2018-02-19 MED ORDER — CEPHALEXIN 500 MG PO CAPS
500.0000 mg | ORAL_CAPSULE | Freq: Two times a day (BID) | ORAL | Status: DC
  Administered 2018-02-19 – 2018-02-20 (×3): 500 mg via ORAL
  Filled 2018-02-19 (×3): qty 1

## 2018-02-19 NOTE — Evaluation (Signed)
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 All education has been completed and the patient has no further questions.  See below for any follow-up Physical Therapy or equipment needs. PT is signing off. Pt is mobilizing well, mildly fatigued but feeling well after amb; encourage pt to amb with nursing or family as tolerated;  Thank you for this referral.     Follow Up Recommendations No PT follow up;Supervision - Intermittent    Equipment Recommendations  3in1 (PT)    Recommendations for Other Services       Precautions / Restrictions Precautions Precautions: Fall Restrictions Weight Bearing Restrictions: No      Mobility  Bed Mobility Overal bed mobility: Independent             General bed mobility comments: bed flat, no rails  Transfers Overall transfer level: Needs assistance Equipment used: Rolling walker (2 wheeled) Transfers: Sit to/from Stand Sit to Stand: Modified independent (Device/Increase time) Stand pivot transfers: Modified independent (Device/Increase time)          Ambulation/Gait Ambulation/Gait assistance: Independent Gait Distance (Feet): 160 Feet Assistive device: None Gait Pattern/deviations: Step-through pattern     General Gait Details: SpO2=100% on RA, pt denies SOB; no LOb with above distance  Stairs            Wheelchair Mobility    Modified Rankin (Stroke Patients Only)        Balance Overall balance assessment: Mild deficits observed, not formally tested                           High level balance activites: Direction changes;Turns High Level Balance Comments: no LOB with above             Pertinent Vitals/Pain Pain Assessment: No/denies pain    Home Living Family/patient expects to be discharged to:: Private residence Living Arrangements: Alone Available Help at Discharge: Family;Friend(s);Available PRN/intermittently Type of Home: House Home Access: Stairs to enter   [employer] of Steps: 5 Home Layout: Multi-level Home Equipment: None      Prior Function Level of Independence: Independent               Hand Dominance        Extremity/Trunk Assessment   Upper Extremity Assessment Upper Extremity Assessment: Generalized weakness    Lower Extremity Assessment Lower Extremity Assessment: Generalized weakness       Communication   Communication: No difficulties  Cognition Arousal/Alertness: Awake/alert Behavior During Therapy: WFL for tasks assessed/performed Overall Cognitive Status: Within Functional Limits for tasks assessed                                        General Comments      Exercises     Assessment/Plan    PT Assessment Patent does not need any further  PT services  PT Problem List         PT Treatment Interventions      PT Goals (Current goals can be found in the Care Plan section)  Acute Rehab PT Goals Patient Stated Goal: feel better PT Goal Formulation: All assessment and education complete, DC therapy    Frequency     Barriers to discharge        Co-evaluation               AM-PAC PT "6 Clicks" Daily Activity  Outcome Measure Difficulty turning over in bed (including adjusting bedclothes, sheets and blankets)?: None Difficulty moving from lying on back to sitting on the side of the bed? : None Difficulty sitting down on and standing up  from a chair with arms (e.g., wheelchair, bedside commode, etc,.)?: None Help needed moving to and from a bed to chair (including a wheelchair)?: None Help needed walking in hospital room?: None Help needed climbing 3-5 steps with a railing? : None 6 Click Score: 24    End of Session   Activity Tolerance: Patient tolerated treatment well Patient left: in bed;with family/visitor present;with call bell/phone within reach   PT Visit Diagnosis: Other abnormalities of gait and mobility (R26.89)    Time: 6945-0388 PT Time Calculation (min) (ACUTE ONLY): 19 min   Charges:   PT Evaluation $PT Eval Low Complexity: 1 Low          Kenyon Ana, PT Pager: [phone] 02/19/2018   [facility] [phone]   [facility] 02/19/2018, 2:05 PM

## 2018-02-19 NOTE — Progress Notes (Signed)
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; pt was found to be noted to be tachypneic, tachycardic in the ED and was admitted                OT comments  Pt much improved this OT visit  Follow Up Recommendations  Supervision - Intermittent    Equipment Recommendations  None recommended by OT    Recommendations for Other Services      Precautions / Restrictions Precautions Precautions: Fall Restrictions Weight Bearing Restrictions: No       Mobility Bed Mobility Overal bed mobility: Independent             General bed mobility comments: bed flat, no rails  Transfers Overall transfer level: Needs assistance Equipment used: Rolling walker (2 wheeled) Transfers: Sit to/from [facility] Sit to Stand: Min guard Stand pivot transfers: Min guard            Balance Overall balance assessment: Mild deficits observed, not formally tested                           High level balance activites: Direction changes;Turns High Level Balance Comments: no LOB with above           ADL either performed or assessed with clinical judgement   ADL               Lower Body Bathing: Minimal assistance;Sit to/from stand;Cueing for sequencing;Cueing for compensatory techniques;Cueing for safety           Toilet Transfer: Minimal assistance;RW;BSC;Cueing for sequencing;Cueing for safety;Stand-pivot   Toileting- Clothing Manipulation and Hygiene: Minimal assistance;Sit to/from stand;Cueing for safety         General ADL Comments: pt has improved this day.     Vision Baseline  Vision/History: No visual deficits     Perception     Praxis      Cognition Arousal/Alertness: Awake/alert Behavior During Therapy: WFL for tasks assessed/performed Overall Cognitive Status: Within Functional Limits for tasks assessed                                          Exercises     Shoulder Instructions       General Comments      Pertinent Vitals/ Pain       Pain Assessment: (P) No/denies pain  Home Living Family/patient expects to be discharged to:: Private residence Living Arrangements: Alone Available Help at Discharge: Family;Friend(s);Available PRN/intermittently Type of Home: House Home Access: Stairs to enter [employer] of Steps: 5   Home Layout: Multi-level Alternate Level Stairs-Number of Steps: 5-6       Bathroom Toilet: Standard     Home Equipment: None          Prior Functioning/Environment Level of Independence: Independent            Frequency  Min 2X/week        Progress Toward Goals  OT Goals(current goals can now be found in the care plan section)  Progress towards OT goals: Progressing toward goals  Acute Rehab  OT Goals Patient Stated Goal: feel better  Plan Discharge plan needs to be updated       AM-PAC PT "6 Clicks" Daily Activity     Outcome Measure   Help from another person eating meals?: A Little Help from another person taking care of personal grooming?: A Little Help from another person toileting, which includes using toliet, bedpan, or urinal?: A Little Help from another person bathing (including washing, rinsing, drying)?: A Little Help from another person to put on and taking off regular upper body clothing?: A Little Help from another person to put on and taking off regular lower body clothing?: A Little 6 Click Score: 18    End of Session    OT Visit Diagnosis: Unsteadiness on feet (R26.81);Other abnormalities of gait and mobility (R26.89);Muscle weakness  (generalized) (M62.81);History of falling (Z91.81)   Activity Tolerance Patient tolerated treatment well   Patient Left in chair;with nursing/sitter in room;with call bell/phone within reach   Nurse Communication Mobility status        Time: 1115-1130 OT Time Calculation (min): 15 min  Charges: OT General Charges $OT Visit: 1 Visit OT Treatments $Self Care/Home Management : 8-22 mins  [place], [place]   Betsy Pries 02/19/2018, 3:53 PM

## 2018-02-19 NOTE — Progress Notes (Signed)
[facility] Progress Note  Patient: Tracy Flynn KDX:[id number]   PCP: Antony Blackbird, MD DOB: 04/30/1956   DOA: 02/15/2018   DOS: 02/19/2018   Date of Service: the patient was seen and examined on 02/19/2018  Subjective: Eating okay, feels like patient's belly is somewhat more distended as compared to yesterday.  No nausea no vomiting.  Breathing okay.  No cough no chest pain..  Brief hospital course: Pt. 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 Currently further plan is symptom control and treatment of UTI.  Assessment and Plan: 1.  Hypoalbuminemia, third spacing. Recurrent pleural effusion. Malignant ascites. 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 Acute hypoxic respiratory failure secondary to pleural effusion Underwent ultrasound-guided thoracentesis in ED with 1 L fluid removal. Labs were not sent. Last thoracentesis prior to this admission was on 01/13/2018. Pulmonary was consulted recommended Pleurx catheter. Patient placed on IV Lasix 60 mg every 12 hours along with albumin support.  Currently I will change her to oral torsemide and monitor in the hospital for response. Strict ins and outs and daily weight. Also underwent therapeutic paracentesis with 9 L removed of chylous fluid. Received IV albumin support Renal function appears stable. Abdomen still distended and tight. Patient may benefit from Pleurx catheter as well as peritoneal drainage catheter, interventional radiology consulted patient scheduled for Pleurx during the hospitalization and will most likely get peritoneal drainage catheter placement outpatient. And is currently refusing Pleurx catheter placement as well as peritoneal drainage catheter  placement. Based on the chest x-ray today may require a repeat thoracentesis as well as paracentesis based on the clinical examination tomorrow. Will reassess tomorrow.  2.  Goals of care discussion. Appreciate palliative care assistance. They had extensive discussion with patient yesterday. Also appreciate assistance from oncology. Patient is not a candidate for chemotherapy or any other assistive therapy due to her poor performance status as well as progression of the disease. After discussing with patient's son as well as patient her CODE STATUS was changed from full code to DNR/DNI at the request of the family and patient. If the patient requires Pleurx catheter as well as peritoneal catheter she will require assistance at home most likely from hospice. Patient is still not decided on considering that option but I am highly recommending her to consider the option of this on discharge. She would like to think about it.  3.  UTI. Leukocytosis. Urine culture is growing E. coli. Although suspect that the leukocytosis is more likely stress reaction. Chest x-ray no acute infiltrate. On IV Rocephin and the sensitivity will change to oral Keflex.  4.  Iron deficiency anemia. Hemoglobin trending down etiology not clear. IV iron infusion ordered. 1 PRBC also ordered.  H&H stable after transfusion.  5.  Severe protein calorie malnutrition. Secondary to cancer. Nutrition supplementation. Prealbumin level 7.2.  6.  Chronic thrombocytosis. Stable.  Monitor.  Diet: Regular diet DVT Prophylaxis: subcutaneous Heparin  Advance goals of care discussion: DNR/DNI  Family Communication: no family was present at bedside, at the time of interview.  Son was on the phone while interviewing the patient.  Disposition:  Discharge to home preferably home hospice.  Likely tomorrow.  Consultants: Palliative care, oncology, IR Procedures: Therapeutic paracentesis therapeutic  paracentesis.  Antibiotics: Anti-infectives (From admission, onward)   Start  Dose/Rate Route Frequency Ordered Stop   02/19/18 1000  cephALEXin (KEFLEX) capsule 500 mg     500 mg Oral Every 12 hours 02/19/18 0847     02/16/18 0930  cefTRIAXone (ROCEPHIN) 2 g in sodium chloride 0.9 % 100 mL IVPB  Status:  Discontinued     2 g 200 mL/hr over 30 Minutes Intravenous Daily 02/16/18 0751 02/19/18 0846       Objective: Physical Exam: Vitals:   02/18/18 2047 02/19/18 0438 02/19/18 0830 02/19/18 1306  BP: 133/72 123/64 125/62 132/75  Pulse: (!) 111 90 90 (!) 102  Resp: (!) 26 20 20 16   Temp: 100 F (37.8 C) 99.2 F (37.3 C) 99.6 F (37.6 C) 98.6 F (37 C)  TempSrc: Oral Oral Oral Oral  SpO2: 100% 100% 100% 100%  Weight:  70.8 kg    Height:        Intake/Output Summary (Last 24 hours) at 02/19/2018 1548 Last data filed at 02/19/2018 1327 Gross per 24 hour  Intake 1373.55 ml  Output -  Net 1373.55 ml   Filed Weights   02/15/18 2030 02/16/18 0500 02/19/18 0438  Weight: 80 kg 79.2 kg 70.8 kg   General: Alert, Awake and Oriented to Time, Place and Person. Appear in moderate distress, affect blunted Eyes: PERRL, Conjunctiva normal ENT: Oral Mucosa clear moist. Neck: no JVD, no Abnormal Mass Or lumps Cardiovascular: S1 and S2 Present, no Murmur, Peripheral Pulses Present Respiratory: normal respiratory effort, Bilateral Air entry equal and Decreased, no use of accessory muscle, Clear to Auscultation, no Crackles, no wheezes Abdomen: Bowel Sound present, distended, soft and no tenderness, no hernia Skin: on redness, no Rash, no induration Extremities: bilateral  Pedal edema, no calf tenderness Neurologic: Grossly no focal neuro deficit. Bilaterally Equal motor strength  Data Reviewed: CBC: Recent Labs  Lab 02/15/18 1123 02/16/18 0300 02/17/18 0315 02/18/18 0521 02/18/18 0746 02/18/18 1759 02/19/18 0741  WBC 15.7* 17.8* 15.6* 10.1 9.8  --  11.2*  NEUTROABS 14.1* 15.8*  13.6* 8.5* 8.3*  --   --   HGB 9.7* 8.5* 7.2* 6.6* 6.7* 8.5* 8.6*  HCT 31.7* 28.1* 23.7* 21.6* 21.6* 27.7* 28.2*  MCV 79.4 77.6* 78.2 78.5 78.0  --  78.1  PLT 932* 793* 706* 575* 568*  --  762*   Basic Metabolic Panel: Recent Labs  Lab 02/15/18 1123 02/15/18 1216 02/16/18 0300 02/17/18 0315 02/18/18 0521 02/19/18 0741  NA 135  --  137 138 138 142  K 4.4  --  4.6 3.6 3.2* 3.1*  CL 101  --  104 103 105 107  CO2 24  --  26 27 26 27   GLUCOSE 99  --  118* 106* 95 90  BUN 28*  --  30* 27* 20 16  CREATININE 0.89  --  0.86 0.74 0.61 0.62  CALCIUM 9.0  --  8.4* 8.2* 8.4* 8.9  MG  --  2.5*  --  2.2 2.1  --     Liver Function Tests: Recent Labs  Lab 02/15/18 1123 02/16/18 0300 02/17/18 0315 02/18/18 0521  AST 33 17 19 22   ALT 15 13 13 12   ALKPHOS 175* 138* 113 68  BILITOT 0.7 0.4 0.3 0.5  PROT 7.9 6.8 6.0* 5.8*  ALBUMIN 1.8* 1.5* 2.6* 3.6   No results for input(s): LIPASE, AMYLASE in the last 168 hours. No results for input(s): AMMONIA in the last 168 hours. Coagulation Profile: Recent Labs  Lab 02/18/18 0746  INR 1.25   Cardiac Enzymes:  Recent Labs  Lab 02/15/18 1123  TROPONINI 0.08*   BNP (last 3 results) No results for input(s): PROBNP in the last 8760 hours. CBG: Recent Labs  Lab 02/16/18 1726 02/16/18 1828  GLUCAP 56* 80   Studies: Dg Chest Port 1 View  Result Date: 02/19/2018 CLINICAL DATA:  Shortness of breath. EXAM: PORTABLE CHEST 1 VIEW COMPARISON:  One-view chest x-ray 02/15/2018 FINDINGS: The heart is obscured by left hemithorax opacification. The left hemithorax is near completely opacified, slightly worse than on the prior study. The right hemithorax is clear. IMPRESSION: 1. Increased left pleural effusion and airspace disease since the prior study. 2. Right lung remains clear. Electronically Signed   By: San Morelle M.D.   On: 02/19/2018 11:30    Scheduled Meds: . cephALEXin  500 mg Oral Q12H  . chlorhexidine  15 mL Mouth Rinse BID  .  docusate sodium  100 mg Oral BID  . enoxaparin (LOVENOX) injection  40 mg Subcutaneous Q24H  . ferrous sulfate  325 mg Oral BID WC  . folic acid  1 mg Oral Daily  . lactulose  30 g Oral Daily  . loratadine  10 mg Oral Daily  . magnesium hydroxide  15 mL Oral Daily  . mouth rinse  15 mL Mouth Rinse q12n4p  . polyethylene glycol  17 g Oral Daily  . sodium chloride flush  3 mL Intravenous Q12H  . torsemide  40 mg Oral Daily  . cyanocobalamin  100 mcg Oral Daily   Continuous Infusions: . sodium chloride Stopped (02/18/18 1342)   PRN Meds: sodium chloride, acetaminophen **OR** acetaminophen, levalbuterol, ondansetron **OR** ondansetron (ZOFRAN) IV, sodium chloride flush, sodium phosphate, sorbitol, traMADol  Time spent: 35 minutes  Author: Berle Mull, MD [facility] Pager: [phone] 02/19/2018 3:48 PM  If 7PM-7AM, please contact night-coverage at [email or web address], password [facility]

## 2018-02-19 NOTE — Progress Notes (Signed)
Palliative care progress note  Reason for consult: Goals of care in light of metastatic disease  Patient ID: Tracy Flynn, female   DOB: 11-20-1955, 62 y.o.   MRN: [id number]   Discussed case with Dr. Posey Pronto and met with Tracy Flynn as she was sitting in bedside chair as a follow up to prior meetings with with PMT.     She verbalizes no complaints at this time.  No family at bedside.   Discussed again her clinical course as well as options for pleurex catheter placement as well as potential benefits of having home hospice on discharge.   Tracy Flynn tells me she has made decision to forgo pleurex placement and also desires to forgo hospice at this time.  Reviewed the difference in care plan between home health and hospice.  Discussed how to access hospice services from home if desired in the future.  Questions and concerns addressed   Discussed with Dr  Posey Pronto    PMT will continue to support holistically.    Total time: 30 minutes  Greater than 50% of the time was spent in counseling and coordination of care  [name], MD [place] [facility] [phone]

## 2018-02-20 ENCOUNTER — Inpatient Hospital Stay (HOSPITAL_COMMUNITY): Payer: Self-pay

## 2018-02-20 LAB — BASIC METABOLIC PANEL
Anion gap: 13 (ref 5–15)
BUN: 18 mg/dL (ref 8–23)
CHLORIDE: 102 mmol/L (ref 98–111)
CO2: 24 mmol/L (ref 22–32)
CREATININE: 0.82 mg/dL (ref 0.44–1.00)
Calcium: 8.7 mg/dL — ABNORMAL LOW (ref 8.9–10.3)
GFR calc non Af Amer: >60 mL/min (ref >60)
Glucose, Bld: 220 mg/dL — ABNORMAL HIGH (ref 70–99)
Potassium: 3.3 mmol/L — ABNORMAL LOW (ref 3.5–5.1)
Sodium: 139 mmol/L (ref 135–145)

## 2018-02-20 LAB — CULTURE, BLOOD (ROUTINE X 2)
CULTURE: NO GROWTH
Culture: NO GROWTH

## 2018-02-20 LAB — BODY FLUID CULTURE: Culture: NO GROWTH

## 2018-02-20 LAB — CBC
HCT: 34.2 % — ABNORMAL LOW (ref 36.0–46.0)
Hemoglobin: 10.4 g/dL — ABNORMAL LOW (ref 12.0–15.0)
MCH: 24.1 pg — ABNORMAL LOW (ref 26.0–34.0)
MCHC: 30.4 g/dL (ref 30.0–36.0)
MCV: 79.4 fL (ref 78.0–100.0)
Platelets: 653 10*3/uL — ABNORMAL HIGH (ref 150–400)
RBC: 4.31 MIL/uL (ref 3.87–5.11)
RDW: 19.7 % — AB (ref 11.5–15.5)
WBC: 13.5 10*3/uL — ABNORMAL HIGH (ref 4.0–10.5)

## 2018-02-20 MED ORDER — LIDOCAINE HCL 1 % IJ SOLN
INTRAMUSCULAR | Status: AC
  Filled 2018-02-20: qty 10

## 2018-02-20 MED ORDER — DOCUSATE SODIUM 100 MG PO CAPS: 100.0000 mg | ORAL_CAPSULE | Freq: Two times a day (BID) | ORAL | 0 refills | Status: AC

## 2018-02-20 MED ORDER — POLYETHYLENE GLYCOL 3350 17 G PO PACK: 17.0000 g | PACK | Freq: Every day | ORAL | 0 refills | Status: DC

## 2018-02-20 MED ORDER — CEPHALEXIN 500 MG PO CAPS: 500.0000 mg | ORAL_CAPSULE | Freq: Two times a day (BID) | ORAL | 0 refills | Status: AC

## 2018-02-20 MED ORDER — TORSEMIDE 20 MG PO TABS: 40.0000 mg | ORAL_TABLET | Freq: Every day | ORAL | 0 refills | Status: DC

## 2018-02-20 MED ORDER — ALBUMIN HUMAN 25 % IV SOLN
50.0000 g | Freq: Once | INTRAVENOUS | Status: AC
  Administered 2018-02-20: 50 g via INTRAVENOUS
  Filled 2018-02-20: qty 200

## 2018-02-20 MED ORDER — TRAMADOL HCL 50 MG PO TABS: 100.0000 mg | ORAL_TABLET | Freq: Four times a day (QID) | ORAL | 0 refills | Status: AC | PRN

## 2018-02-20 MED ORDER — POTASSIUM CHLORIDE ER 10 MEQ PO TBCR: 20.0000 meq | EXTENDED_RELEASE_TABLET | Freq: Every day | ORAL | 0 refills | Status: DC

## 2018-02-20 MED ORDER — LACTULOSE 10 GM/15ML PO SOLN: 30.0000 g | Freq: Every day | ORAL | 0 refills | Status: AC

## 2018-02-20 MED ORDER — TORSEMIDE 20 MG PO TABS: ORAL_TABLET | ORAL | 0 refills | Status: DC

## 2018-02-20 NOTE — Progress Notes (Signed)
Patient remained A&Ox4, ambulatory without assistance. Discharge instructions reviewed with patient and son. Questions concerns denied

## 2018-02-20 NOTE — Procedures (Signed)
PROCEDURE SUMMARY:  Successful US guided paracentesis from [facility].  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  No immediate complications.  Pt tolerated well.   Specimen was not sent for labs.  Ascencion Dike PA-C 02/20/2018 11:10 AM

## 2018-02-20 NOTE — Care Management Note (Addendum)
Case Management Note  Patient Details  Name: Tracy Flynn MRN: [id number] Date of Birth: [birth year]  Subjective/Objective:    Malignant ascites, ovarian cancer vs endometrial cancer                Action/Plan: NCM spoke to pt and explained charity program with [facility] for [facility]. Contacted [facility] for RW and 3n1 bedside commode for home. Will fax referral for Palliative Services to [facility].   Expected Discharge Date:  02/20/18               Expected Discharge Plan:  [place]  In-House Referral:  NA  Discharge planning Services  CM Consult  Post Acute Care Choice:  Home Health Choice offered to:  Patient  DME Arranged:  3-N-1, Walker rolling DME Agency:  [place]:  RN [facility] Agency:  [place]  Status of Service:  Completed, signed off  If discussed at [place] of Stay Meetings, dates discussed:    Additional Comments:  Erenest Rasher, RN 02/20/2018, 2:46 PM

## 2018-02-20 NOTE — Progress Notes (Signed)
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  Education provided  Follow Up Recommendations  Supervision - Intermittent    Equipment Recommendations  None recommended by OT    Recommendations for Other Services      Precautions / Restrictions Precautions Precautions: Fall Restrictions Weight Bearing Restrictions: No       Mobility Bed Mobility Overal bed mobility: Independent             General bed mobility comments: bed flat, no rails  Transfers Overall transfer level: Needs assistance Equipment used: Rolling walker (2 wheeled) Transfers: Sit to/from [facility] Sit to Stand: Min guard Stand pivot transfers: Min guard            Balance Overall balance assessment: Mild deficits observed, not formally tested                                         ADL either performed or assessed with clinical judgement   ADL                       Lower Body Dressing: Sit to/from stand;Cueing for safety;Cueing for sequencing;Minimal assistance;With adaptive equipment Lower Body Dressing Details (indicate cue type and reason): AE issued and education provided.  Pt thankful.                 General ADL Comments: pt Fatigued this day . Pt did agree to AE education for LB ADL's as well as fxal activity in which bending over hard for pt.  Pt able to return demonstrate use of reacher for OT      Vision Patient  Visual Report: No change from baseline            Cognition Arousal/Alertness: Awake/alert Behavior During Therapy: WFL for tasks assessed/performed;Flat affect Overall Cognitive Status: Within Functional Limits for tasks assessed                                                     Pertinent Vitals/ Pain       Pain Score: 3  Pain Location: general Pain Descriptors / Indicators: Sore         Frequency  Min 2X/week        Progress Toward Goals  OT Goals(current goals can now be found in the care plan section)  Progress towards OT goals: Progressing toward goals     Plan Discharge plan needs to be updated       AM-PAC PT "6 Clicks" Daily Activity     Outcome Measure   Help from another person eating meals?: None Help from another person taking care of personal grooming?: None Help from another person toileting,  which includes using toliet, bedpan, or urinal?: A Little Help from another person bathing (including washing, rinsing, drying)?: A Little Help from another person to put on and taking off regular upper body clothing?: None Help from another person to put on and taking off regular lower body clothing?: A Little 6 Click Score: 21    End of Session    OT Visit Diagnosis: Unsteadiness on feet (R26.81);Other abnormalities of gait and mobility (R26.89);Muscle weakness (generalized) (M62.81);History of falling (Z91.81)   Activity Tolerance Patient tolerated treatment well   Patient Left with nursing/sitter in room;with call bell/phone within reach;in bed   Nurse Communication Mobility status        Time: 1898-4210 OT Time Calculation (min): 17 min  Charges: OT General Charges $OT Visit: 1 Visit OT Treatments $Self Care/Home Management : 8-22 mins  [place], [place]   Betsy Pries 02/20/2018, 3:09 PM

## 2018-02-20 NOTE — Discharge Summary (Signed)
[facility] Discharge Summary   Patient: Tracy Flynn DJM:[id number]   PCP: Antony Blackbird, MD DOB: [birth year]   Date of admission: 02/15/2018   Date of discharge: 02/20/2018     Discharge Diagnoses:  Principal Problem:   Acute respiratory failure with hypoxia ([place]) Active Problems:   Ovarian tumor   Thrombocytosis (HCC)   Anasarca   Severe protein-calorie malnutrition (HCC)   Normochromic normocytic anemia   Recurrent pleural effusion on left   Cancer, metastatic (HCC)   Malignant pleural effusion: Left   Ascites   Leukocytosis   Acute lower UTI   Dyspnea   Palliative care by specialist   Admitted From: home Disposition:  Home with home health  Recommendations for Outpatient Follow-up:  1. Please follow-up with PCP in 1 week  Follow-up Information    Fulp, Cammie, MD. Schedule an appointment as soon as possible for a visit in 1 week(s).   Specialty:  Family Medicine Contact information: [place] [address] [phone]        [facility], [facility] Follow up.   Specialty:  [place] Why:  Home Health RN -agency will call to arrange visit Contact information: [address] [place] [address] [phone]          Diet recommendation: Cardiac diet  Activity: The patient is advised to gradually reintroduce usual activities.  Discharge Condition: good  Code Status: DNR/DNI  History of present illness: As per the H and P dictated on admission, "Tracy Flynn is a unfortumate 62 y.o. 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  Patient denies any fevers, no nausea, no vomiting, no chest pain, no diarrhea, no constipation, no dysuria, no hematemesis, no hematochezia, no melena, no syncopal episodes, no focal neurological deficits.  Patient does endorse some difficulty ambulating due to worsening lower extremity edema.  Patient endorses paroxysmal nocturnal dyspnea and orthopnea stating that she pretty much sleeps in the recliner.  ED Course: Patient seen in the ED, compressive metabolic profile obtained with a BUN of 28, alk phosphatase of 175, albumin of 1.8, BNP of 58.3, troponin of 0.08.  CBC had a white count of 15.7, hemoglobin of 9.7, platelet count of 932.  Chest ray obtained showed a large left pleural effusion resulting in only 10 to 15% of left lung being aerated, stable tiny right pleural effusion.  Patient underwent ultrasound-guided thoracentesis per IR on 02/15/2018 with 1 L of hazy tan fluid removed.  Repeat chest x-ray post thoracentesis with no significant improvement.  Tech in the ED attempted to ambulate the patient post thoracentesis and patient became tachypneic in the 30s, tachycardic in the 130s unable to walk more than 30 feet.  Hospitalist were called to admit the patient for further evaluation and management."  Hospital Course:  Summary of her active problems in the hospital is as following. 1.  Hypoalbuminemia, third spacing. Recurrent pleural effusion. Malignant ascites. 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 Acute hypoxic respiratory failure secondary to pleural effusion Underwent ultrasound-guided thoracentesis in  ED with 1 L fluid removal. Labs were not sent. Last thoracentesis prior to this admission was on 01/13/2018. Pulmonary was consulted recommended Pleurx catheter. Patient placed on IV Lasix 60 mg every 12 hours along with albumin  support.  Currently I will change her to oral torsemide Strict ins and outs and daily weight. Also underwent therapeutic paracentesis with 9 L removed of chylous fluid. Received IV albumin support Renal function appears stable. Abdomen still distended and tight. Patient may benefit from Pleurx catheter as well as peritoneal drainage catheter, interventional radiology consulted patient scheduled for Pleurx during the hospitalization and will most likely get peritoneal drainage catheter placement outpatient. And is currently refusing Pleurx catheter placement as well as peritoneal drainage catheter placement. Repeat paracentesis was performed before discharge 5.5 L was removed patient was given IV albumin.  2.  Goals of care discussion. Appreciate palliative care assistance. They had extensive discussion with patient yesterday. Also appreciate assistance from oncology. Patient is not a candidate for chemotherapy or any other assistive therapy due to her poor performance status as well as progression of the disease. After discussing with patient's son as well as patient her CODE STATUS was changed from full code to DNR/DNI at the request of the family and patient. If the patient requires Pleurx catheter as well as peritoneal catheter she will require assistance at home most likely from hospice. Patient is still not decided on considering that option.  3.  UTI. Leukocytosis. Urine culture is growing E. coli. Although suspect that the leukocytosis is more likely stress reaction. Chest x-ray no acute infiltrate. On IV Rocephin and based on the sensitivity will change to oral Keflex.  4.  Iron deficiency anemia. Hemoglobin trending down etiology not clear. IV iron infusion given. 1 PRBC also ordered.  H&H stable after transfusion.  5.  Severe protein calorie malnutrition. Secondary to cancer. Nutrition supplementation. Prealbumin level 7.2.  6.  Chronic thrombocytosis. Stable.   Monitor.  All other chronic medical condition were stable during the hospitalization.  Patient was seen by physical therapy, who recommended no physical therapy or occupational therapy on discharge.  Home health and care connection was arranged by social worker and case Freight forwarder. On the day of the discharge the patient's vitals were stable , and no other acute medical condition were reported by patient. the patient was felt safe to be discharge at home with home health.  Consultants: Palliative care  Oncology Procedures: Therapeutic paracentesis x2 Therapeutic paracentesis x1 PRBC transfusion x1  DISCHARGE MEDICATION: Allergies as of 02/20/2018      Reactions   Codeine Rash   Penicillins Rash   Has patient had a PCN reaction causing immediate rash, facial/tongue/throat swelling, SOB or lightheadedness with hypotension: Yes Has patient had a PCN reaction causing severe rash involving mucus membranes or skin necrosis:No Has patient had a PCN reaction that required hospitalization: No Has patient had a PCN reaction occurring within the last 10 years: No If all of the above answers are "NO", then may proceed with Cephalosporin use.      Medication List    STOP taking these medications   furosemide 20 MG tablet Commonly known as:  LASIX   ibuprofen 200 MG tablet Commonly known as:  ADVIL,MOTRIN     TAKE these medications   cephALEXin 500 MG capsule Commonly known as:  KEFLEX Take 1 capsule (500 mg total) by mouth 2 (two) times daily for 2 days.   cetirizine 10 MG tablet Commonly known as:  ZYRTEC Take  1 tablet (10 mg total) by mouth daily. What changed:    when to take this  reasons to take this   cyanocobalamin 100 MCG tablet Take 1 tablet (100 mcg total) by mouth daily.   docusate sodium 100 MG capsule Commonly known as:  COLACE Take 1 capsule (100 mg total) by mouth 2 (two) times daily.   ferrous sulfate 325 (65 FE) MG tablet Take 1 tablet (325 mg total) by mouth 2  (two) times daily with a meal.   folic acid 1 MG tablet Commonly known as:  FOLVITE Take 1 tablet (1 mg total) by mouth daily.   lactulose 10 GM/15ML solution Commonly known as:  CHRONULAC Take 45 mLs (30 g total) by mouth daily. Start taking on:  02/21/2018   OVER THE COUNTER MEDICATION Apply 1 application topically 2 (two) times daily as needed (pain).   polyethylene glycol packet Commonly known as:  MIRALAX / GLYCOLAX Take 17 g by mouth daily. Start taking on:  02/21/2018   potassium chloride 10 MEQ tablet Commonly known as:  K-DUR Take 2 tablets (20 mEq total) by mouth daily. What changed:  how much to take   torsemide 20 MG tablet Commonly known as:  DEMADEX Take 40 mg daily till 02/25/2018, take 20 mg daily after that and can take 20 mg extra for weight gain of 3 lbs in 1 day.   traMADol 50 MG tablet Commonly known as:  ULTRAM Take 2 tablets (100 mg total) by mouth every 6 (six) hours as needed for up to 5 days for moderate pain.            Durable Medical Equipment  (From admission, onward)         Start     Ordered   02/20/18 1444  For home use only DME 3 n 1  Once     02/20/18 1444   02/20/18 1444  For home use only DME Walker rolling  Once    Question:  Patient needs a walker to treat with the following condition  Answer:  Malignant ascites   02/20/18 1444         Allergies  Allergen Reactions  . Codeine Rash  . Penicillins Rash    Has patient had a PCN reaction causing immediate rash, facial/tongue/throat swelling, SOB or lightheadedness with hypotension: Yes Has patient had a PCN reaction causing severe rash involving mucus membranes or skin necrosis:No Has patient had a PCN reaction that required hospitalization: No Has patient had a PCN reaction occurring within the last 10 years: No If all of the above answers are "NO", then may proceed with Cephalosporin use.    Discharge Instructions    Diet - low sodium heart healthy   Complete by:  As  directed    Discharge instructions   Complete by:  As directed    It is important that you read following instructions as well as go over your medication list with RN to help you understand your care after this hospitalization.  Discharge Instructions: Please follow-up with PCP in one week  Please request your primary care physician to go over all Hospital Tests and Procedure/Radiological results at the follow up,  Please get all Hospital records sent to your PCP by signing hospital release before you go home.   Do not take more than prescribed Pain, Sleep and Anxiety Medications. You were cared for by a hospitalist during your hospital stay. If you have any questions about your discharge medications or  the care you received while you were in the hospital after you are discharged, you can call the unit and ask to speak with the hospitalist on call if the hospitalist that took care of you is not available.  Once you are discharged, your primary care physician will handle any further medical issues. Please note that NO REFILLS for any discharge medications will be authorized once you are discharged, as it is imperative that you return to your primary care physician (or establish a relationship with a primary care physician if you do not have one) for your aftercare needs so that they can reassess your need for medications and monitor your lab values. You Must read complete instructions/literature along with all the possible adverse reactions/side effects for all the Medicines you take and that have been prescribed to you. Take any new Medicines after you have completely understood and accept all the possible adverse reactions/side effects. Wear Seat belts while driving. If you have smoked or chewed Tobacco in the last 2 yrs please stop smoking and/or stop any Recreational drug use.   Increase activity slowly   Complete by:  As directed      Discharge Exam: Filed Weights   02/16/18 0500  02/19/18 0438 02/20/18 0548  Weight: 79.2 kg 70.8 kg 68.5 kg   Vitals:   02/20/18 1200 02/20/18 1248  BP: 108/71 120/79  Pulse: (!) 103 (!) 102  Resp: 16 16  Temp: 97.6 F (36.4 C) 97.6 F (36.4 C)  SpO2: 100% 100%   General: Appear in mild distress, no Rash; Oral Mucosa moist. Cardiovascular: S1 and S2 Present, no Murmur, no JVD Respiratory: Bilateral Air entry present and Clear to Auscultation, no Crackles, no wheezes Abdomen: Bowel Sound present, Soft and no tenderness Extremities: bilateral Pedal edema, no calf tenderness Neurology: Grossly no focal neuro deficit.  The results of significant diagnostics from this hospitalization (including imaging, microbiology, ancillary and laboratory) are listed below for reference.    Significant Diagnostic Studies: Dg Chest 1 View  Result Date: 02/15/2018 CLINICAL DATA:  Thoracentesis EXAM: CHEST  1 VIEW COMPARISON:  Earlier today FINDINGS: Decrease in left pleural fluid, although still large volume. No pneumothorax. Low volume but clear right lung. The heart is obscured. IMPRESSION: 1. No evidence of complication from left thoracentesis. 2. Residual pleural fluid remains large volume. Electronically Signed   By: Monte Fantasia M.D.   On: 02/15/2018 16:14   Dg Chest 1 View  Result Date: 01/29/2018 CLINICAL DATA:  Status post left thoracentesis. EXAM: CHEST  1 VIEW COMPARISON:  One-view chest x-ray 01/29/2018 at 12:40 p.m. FINDINGS: The left pleural effusion has decreased since the prior exam. A large left pleural effusion remains. No pneumothorax is present. Left lower lobe opacity remains. Small right pleural effusion is noted. IMPRESSION: 1. Slight decrease in left pleural effusion without pneumothorax following thoracentesis. 2. Diffuse left-sided airspace disease remains concerning for pneumonia. 3. Small right pleural effusion. Electronically Signed   By: San Morelle M.D.   On: 01/29/2018 17:57   Dg Chest 2 View  Result Date:  02/15/2018 CLINICAL DATA:  Weakness, shortness of breath, upper back pain, and abdominal distention and tightness for the past several days. History of thoracentesis on the left as recently is January 13, 2018. History of ovarian tumor. EXAM: CHEST - 2 VIEW COMPARISON:  Chest x-ray of February 04, 2018 FINDINGS: There has been slight further accumulation of pleural fluid on the left. Only approximately 10-15% of the left lung remains aerated. There is  a small right pleural effusion which is stable. The lung parenchymal on the right is clear. The right heart border and right pulmonary vascularity is unremarkable. No pulmonary edema on the right is observed. IMPRESSION: Large left pleural effusion resulting in only 10-15% of the left lung being aerated. This is slightly more conspicuous than on the previous study. Stable tiny right pleural effusion. Electronically Signed   By: David  Martinique M.D.   On: 02/15/2018 11:46   Dg Chest 2 View  Result Date: 02/04/2018 CLINICAL DATA:  Shortness of breath. EXAM: CHEST - 2 VIEW COMPARISON:  Radiograph of January 29, 2018. FINDINGS: Large left pleural effusion appears to be slightly increased in size. Underlying atelectasis or infiltrate cannot be excluded. No pneumothorax is noted. Right lung is unremarkable. Bony thorax is unremarkable. IMPRESSION: Large left pleural effusion is noted which appears to be slightly increased in size. Underlying atelectasis or infiltrate may be present. Electronically Signed   By: Marijo Conception, M.D.   On: 02/04/2018 09:32   US Paracentesis  Result Date: 02/20/2018 INDICATION: Metastatic ovarian cancer. Recurrent ascites. Request for therapeutic paracentesis. EXAM: ULTRASOUND GUIDED LEFT LOWER QUADRANT PARACENTESIS MEDICATIONS: None. COMPLICATIONS: None immediate. PROCEDURE: Informed written consent was obtained from the patient after a discussion of the risks, benefits and alternatives to treatment. A timeout was performed prior to the  initiation of the procedure. Initial ultrasound scanning demonstrates a large amount of ascites within the left lower abdominal quadrant. The left lower abdomen was prepped and draped in the usual sterile fashion. 1% lidocaine with epinephrine was used for local anesthesia. Following this, a 19 gauge, 10-cm, Yueh catheter was introduced. An ultrasound image was saved for documentation purposes. The paracentesis was performed. The catheter was removed and a dressing was applied. The patient tolerated the procedure well without immediate post procedural complication. FINDINGS: A total of approximately 5.5 L of milky, chylous fluid was removed. IMPRESSION: Successful ultrasound-guided paracentesis yielding 5.5 liters of peritoneal fluid. Read by: Ascencion Dike PA-C Electronically Signed   By: Jerilynn Mages.  Shick M.D.   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 Request for diagnostic therapeutic paracentesis. EXAM: ULTRASOUND GUIDED PARACENTESIS MEDICATIONS: 1% lidocaine 10 mL COMPLICATIONS: None immediate. PROCEDURE: Informed written consent was obtained from the patient after a discussion of the risks, benefits and alternatives to treatment. A timeout was performed prior to the initiation of the procedure. Initial ultrasound scanning demonstrates a large amount of ascites within the left lower abdominal quadrant. The left lower abdomen was prepped and draped in the usual sterile fashion. 1% lidocaine with epinephrine was used for local anesthesia. Following this, a 19 gauge, 7-cm, Yueh catheter was introduced. An ultrasound image was saved for documentation purposes. The paracentesis was performed. The catheter was removed and a dressing was applied. The patient tolerated the procedure well without immediate post procedural complication. FINDINGS: A total of approximately 9.1 L of chylous ascites was removed. Samples were sent to the  laboratory as requested by the clinical team. IMPRESSION: Successful ultrasound-guided paracentesis yielding 9.1 liters of peritoneal fluid. Read by: Gareth Eagle, PA-C Electronically Signed   By: Lucrezia Europe M.D.   On: 02/16/2018 17:03   US Paracentesis  Result Date: 02/04/2018 INDICATION: Patient with history of metastatic ovarian cancer and recurrent ascites. Request is made for therapeutic paracentesis. EXAM: ULTRASOUND GUIDED THERAPEUTIC PARACENTESIS MEDICATIONS: 10 mL 1% lidocaine COMPLICATIONS: None immediate. PROCEDURE: Informed written consent was obtained from the  patient after a discussion of the risks, benefits and alternatives to treatment. A timeout was performed prior to the initiation of the procedure. Initial ultrasound scanning demonstrates a moderate amount of ascites within the left lower abdominal quadrant. The left lower abdomen was prepped and draped in the usual sterile fashion. 1% lidocaine was used for local anesthesia. Following this, a 19 gauge, 7-cm, Yueh catheter was introduced. An ultrasound image was saved for documentation purposes. The paracentesis was performed. The catheter was removed and a dressing was applied. The patient tolerated the procedure well without immediate post procedural complication. FINDINGS: A total of approximately 8 L of milky white fluid was removed. IMPRESSION: Successful ultrasound-guided paracentesis yielding 8 L of peritoneal fluid. Read by: Earley Abide, PA-C Electronically Signed   By: Sandi Mariscal M.D.   On: 02/04/2018 13:02   Dg Chest Port 1 View  Result Date: 02/19/2018 CLINICAL DATA:  Shortness of breath. EXAM: PORTABLE CHEST 1 VIEW COMPARISON:  One-view chest x-ray 02/15/2018 FINDINGS: The heart is obscured by left hemithorax opacification. The left hemithorax is near completely opacified, slightly worse than on the prior study. The right hemithorax is clear. IMPRESSION: 1. Increased left pleural effusion and airspace disease since the prior  study. 2. Right lung remains clear. Electronically Signed   By: San Morelle M.D.   On: 02/19/2018 11:30   Dg Chest Port 1 View  Result Date: 01/29/2018 CLINICAL DATA:  Shortness of breath EXAM: PORTABLE CHEST 1 VIEW COMPARISON:  01/13/2018 and earlier FINDINGS: Large left pleural effusion, reaching the apex. The appearance is similar to 01/12/2018 and increased from a post thoracentesis radiograph 01/13/2018. The right lung is clear. The heart is largely obscured. IMPRESSION: Recurrent left pleural effusion with large volume, as large as seen on the 01/12/2018 exam. Electronically Signed   By: Monte Fantasia M.D.   On: 01/29/2018 13:19   US Thoracentesis Asp Pleural Space W/img Guide  Result Date: 02/15/2018 INDICATION: Recurrent symptomatic pleural effusion; history of metastatic ovarian cancer. Request for therapeutic thoracentesis. EXAM: ULTRASOUND GUIDED LEFT THORACENTESIS MEDICATIONS: 10 mL 1% lidocaine COMPLICATIONS: None immediate. PROCEDURE: An ultrasound guided thoracentesis was thoroughly discussed with the patient and questions answered. The benefits, risks, alternatives and complications were also discussed. The patient understands and wishes to proceed with the procedure. Written consent was obtained. Ultrasound was performed to localize and mark an adequate pocket of fluid in the left chest. The area was then prepped and draped in the normal sterile fashion. 1% Lidocaine was used for local anesthesia. Under ultrasound guidance a 6 Fr Safe-T-Centesis catheter was introduced. Thoracentesis was performed. The catheter was removed and a dressing applied. FINDINGS: A total of approximately 1 L of hazy tan fluid was removed. IMPRESSION: Successful ultrasound guided left thoracentesis yielding 1 L of pleural fluid. Read by Candiss Norse, PA-C Electronically Signed   By: Sandi Mariscal M.D.   On: 02/15/2018 17:06   US Thoracentesis Asp Pleural Space W/img Guide  Result Date:  01/29/2018 INDICATION: Patient with history of metastatic ovarian cancer, dyspnea, recurrent left pleural effusion. Request made for therapeutic left thoracentesis. EXAM: ULTRASOUND GUIDED THERAPEUTIC LEFT THORACENTESIS MEDICATIONS: None COMPLICATIONS: None immediate. PROCEDURE: An ultrasound guided thoracentesis was thoroughly discussed with the patient and questions answered. The benefits, risks, alternatives and complications were also discussed. The patient understands and wishes to proceed with the procedure. Written consent was obtained. Ultrasound was performed to localize and mark an adequate pocket of fluid in the left chest. The area was then prepped and  draped in the normal sterile fashion. 1% Lidocaine was used for local anesthesia. Under ultrasound guidance a 6 Fr Safe-T-Centesis catheter was introduced. Thoracentesis was performed. The catheter was removed and a dressing applied. FINDINGS: A total of approximately 1.6 liters of dark, bloody fluid was removed. IMPRESSION: Successful ultrasound guided therapeutic left thoracentesis yielding 1.6 liters of pleural fluid. Read by: Rowe Robert, PA-C Electronically Signed   By: Lucrezia Europe M.D.   On: 01/29/2018 17:27    Microbiology: Recent Results (from the past 240 hour(s))  Culture, blood (Routine X 2) w Reflex to ID Panel     Status: None   Collection Time: 02/15/18  9:07 PM  Result Value Ref Range Status   Specimen Description   Final    BLOOD BLOOD RIGHT FOREARM Performed at [facility] [address] [place], [place] [address]    Special Requests   Final    BOTTLES DRAWN AEROBIC ONLY Blood Culture results may not be optimal due to an inadequate volume of blood received in culture bottles Performed at [facility] [address], [place], [place] [address]    Culture   Final    NO GROWTH 5 DAYS Performed at [facility], [place] [address], [place], [place] [address]    Report Status  02/20/2018 FINAL  Final  Culture, blood (Routine X 2) w Reflex to ID Panel     Status: None   Collection Time: 02/15/18  9:07 PM  Result Value Ref Range Status   Specimen Description   Final    BLOOD LEFT ANTECUBITAL Performed at [facility] [address] [place], [place] [address]    Special Requests   Final    BOTTLES DRAWN AEROBIC ONLY Blood Culture results may not be optimal due to an inadequate volume of blood received in culture bottles Performed at [facility] [address] [place], [place] [address]    Culture   Final    NO GROWTH 5 DAYS Performed at [facility], [place] [address], [place], [place] [address]    Report Status 02/20/2018 FINAL  Final  MRSA PCR Screening     Status: None   Collection Time: 02/15/18  9:42 PM  Result Value Ref Range Status   MRSA by PCR NEGATIVE NEGATIVE Final    Comment:        The GeneXpert MRSA Assay (FDA approved for NASAL specimens only), is one component of a comprehensive MRSA colonization surveillance program. It is not intended to diagnose MRSA infection nor to guide or monitor treatment for MRSA infections. Performed at [facility], [place] [address], [place], [place] [address]   Culture, Urine     Status: Abnormal   Collection Time: 02/16/18  4:27 AM  Result Value Ref Range Status   Specimen Description   Final    URINE, CLEAN CATCH Performed at [facility], [place] [address], [place], [place] [address]    Special Requests   Final    NONE Performed at [facility], [place] [address], [place], [place] [address]    Culture 50,000 COLONIES/mL ESCHERICHIA COLI (A)  Final   Report Status 02/18/2018 FINAL  Final   Organism ID, Bacteria ESCHERICHIA COLI (A)  Final      Susceptibility   Escherichia coli - MIC*    AMPICILLIN >=32 RESISTANT Resistant     CEFAZOLIN <=4 SENSITIVE Sensitive     CEFTRIAXONE <=1  SENSITIVE Sensitive      CIPROFLOXACIN <=0.25 SENSITIVE Sensitive     GENTAMICIN <=1 SENSITIVE Sensitive     IMIPENEM <=0.25 SENSITIVE Sensitive     NITROFURANTOIN <=16 SENSITIVE Sensitive     TRIMETH/SULFA <=20 SENSITIVE Sensitive     AMPICILLIN/SULBACTAM >=32 RESISTANT Resistant     PIP/TAZO <=4 SENSITIVE Sensitive     Extended ESBL NEGATIVE Sensitive     * 50,000 COLONIES/mL ESCHERICHIA COLI  Body fluid culture     Status: None   Collection Time: 02/16/18  5:36 PM  Result Value Ref Range Status   Specimen Description   Final    PERITONEAL Performed at [place] [address], [place], [place] [address]    Special Requests   Final    NONE Performed at [facility], [place] [address], [place], [place] [address]    Gram Stain   Final    FEW WBC PRESENT, PREDOMINANTLY MONONUCLEAR NO ORGANISMS SEEN    Culture   Final    NO GROWTH 3 DAYS Performed at [place] [address], [place],  [address]    Report Status 02/20/2018 FINAL  Final     Labs: CBC: Recent Labs  Lab 02/15/18 1123 02/16/18 0300 02/17/18 0315 02/18/18 0521 02/18/18 0746 02/18/18 1759 02/19/18 0741 02/20/18 0929  WBC 15.7* 17.8* 15.6* 10.1 9.8  --  11.2* 13.5*  NEUTROABS 14.1* 15.8* 13.6* 8.5* 8.3*  --   --   --   HGB 9.7* 8.5* 7.2* 6.6* 6.7* 8.5* 8.6* 10.4*  HCT 31.7* 28.1* 23.7* 21.6* 21.6* 27.7* 28.2* 34.2*  MCV 79.4 77.6* 78.2 78.5 78.0  --  78.1 79.4  PLT 932* 793* 706* 575* 568*  --  600* 086*   Basic Metabolic Panel: Recent Labs  Lab 02/15/18 1216 02/16/18 0300 02/17/18 0315 02/18/18 0521 02/19/18 0741 02/20/18 0929  NA  --  137 138 138 142 139  K  --  4.6 3.6 3.2* 3.1* 3.3*  CL  --  104 103 105 107 102  CO2  --  26 27 26 27 24   GLUCOSE  --  118* 106* 95 90 220*  BUN  --  30* 27* 20 16 18   CREATININE  --  0.86 0.74 0.61 0.62 0.82  CALCIUM  --  8.4* 8.2* 8.4* 8.9 8.7*  MG 2.5*  --  2.2 2.1  --   --    Liver Function Tests: Recent Labs  Lab  02/15/18 1123 02/16/18 0300 02/17/18 0315 02/18/18 0521  AST 33 17 19 22   ALT 15 13 13 12   ALKPHOS 175* 138* 113 68  BILITOT 0.7 0.4 0.3 0.5  PROT 7.9 6.8 6.0* 5.8*  ALBUMIN 1.8* 1.5* 2.6* 3.6   No results for input(s): LIPASE, AMYLASE in the last 168 hours. No results for input(s): AMMONIA in the last 168 hours. Cardiac Enzymes: Recent Labs  Lab 02/15/18 1123  TROPONINI 0.08*   BNP (last 3 results) Recent Labs    12/28/17 1709 01/12/18 1722 02/15/18 1123  BNP 34.5 67.8 58.3   CBG: Recent Labs  Lab 02/16/18 1726 02/16/18 1828  GLUCAP 56* 80   Time spent: 35 minutes  Signed:  Berle Mull  [facility] 02/20/2018 , 6:12 PM

## 2018-02-25 ENCOUNTER — Ambulatory Visit: Payer: Self-pay | Attending: Family Medicine | Admitting: Family Medicine

## 2018-02-25 ENCOUNTER — Ambulatory Visit: Payer: Self-pay | Admitting: Family Medicine

## 2018-02-25 ENCOUNTER — Telehealth: Payer: Self-pay | Admitting: Family Medicine

## 2018-02-25 ENCOUNTER — Other Ambulatory Visit: Payer: Self-pay

## 2018-02-25 ENCOUNTER — Encounter: Payer: Self-pay | Admitting: Family Medicine

## 2018-02-25 VITALS — BP 128/87 | HR 105 | Temp 98.2°F | Resp 20 | Ht 71.0 in | Wt 145.8 lb

## 2018-02-25 DIAGNOSIS — R180 Malignant ascites: Secondary | ICD-10-CM

## 2018-02-25 DIAGNOSIS — Z79899 Other long term (current) drug therapy: Secondary | ICD-10-CM

## 2018-02-25 DIAGNOSIS — R350 Frequency of micturition: Secondary | ICD-10-CM

## 2018-02-25 DIAGNOSIS — C799 Secondary malignant neoplasm of unspecified site: Secondary | ICD-10-CM

## 2018-02-25 DIAGNOSIS — E43 Unspecified severe protein-calorie malnutrition: Secondary | ICD-10-CM

## 2018-02-25 DIAGNOSIS — D509 Iron deficiency anemia, unspecified: Secondary | ICD-10-CM

## 2018-02-25 DIAGNOSIS — Z66 Do not resuscitate: Secondary | ICD-10-CM

## 2018-02-25 DIAGNOSIS — N390 Urinary tract infection, site not specified: Secondary | ICD-10-CM

## 2018-02-25 DIAGNOSIS — J910 Malignant pleural effusion: Secondary | ICD-10-CM

## 2018-02-25 DIAGNOSIS — C801 Malignant (primary) neoplasm, unspecified: Secondary | ICD-10-CM | POA: Insufficient documentation

## 2018-02-25 LAB — POCT URINALYSIS DIP (CLINITEK)
Bilirubin, UA: NEGATIVE
Blood, UA: NEGATIVE
Glucose, UA: NEGATIVE mg/dL
Ketones, POC UA: NEGATIVE mg/dL
Leukocytes, UA: NEGATIVE
Nitrite, UA: NEGATIVE
Spec Grav, UA: 1.02
Urobilinogen, UA: 0.2 U/dL
pH, UA: 5

## 2018-02-25 MED ORDER — FERROUS SULFATE 325 (65 FE) MG PO TABS: 325.0000 mg | ORAL_TABLET | Freq: Two times a day (BID) | ORAL | 6 refills | Status: AC

## 2018-02-25 MED ORDER — CEPHALEXIN 500 MG PO CAPS: 500.0000 mg | ORAL_CAPSULE | Freq: Two times a day (BID) | ORAL | 0 refills | Status: AC

## 2018-02-25 NOTE — Progress Notes (Signed)
2 week follow up. Patient declined a flu shot.

## 2018-02-25 NOTE — Telephone Encounter (Signed)
After her visit, pt filled out a Parking placecard and was left in PCP inbox for completion, please follow up when ready for pickup

## 2018-02-26 ENCOUNTER — Telehealth: Payer: Self-pay | Admitting: Family Medicine

## 2018-02-26 LAB — CBC WITH DIFFERENTIAL/PLATELET
Basophils Absolute: 0.1 x10E3/uL (ref 0.0–0.2)
Basos: 1 %
EOS (ABSOLUTE): 0 x10E3/uL (ref 0.0–0.4)
Eos: 0 %
Hematocrit: 38.4 % (ref 34.0–46.6)
Hemoglobin: 11.7 g/dL (ref 11.1–15.9)
Immature Grans (Abs): 0.1 x10E3/uL (ref 0.0–0.1)
Immature Granulocytes: 1 %
Lymphocytes Absolute: 0.6 x10E3/uL — ABNORMAL LOW (ref 0.7–3.1)
Lymphs: 4 %
MCH: 24.1 pg — ABNORMAL LOW (ref 26.6–33.0)
MCHC: 30.5 g/dL — ABNORMAL LOW (ref 31.5–35.7)
MCV: 79 fL (ref 79–97)
Monocytes Absolute: 0.8 x10E3/uL (ref 0.1–0.9)
Monocytes: 6 %
Neutrophils Absolute: 11.5 x10E3/uL — ABNORMAL HIGH (ref 1.4–7.0)
Neutrophils: 88 %
Platelets: 658 x10E3/uL — ABNORMAL HIGH (ref 150–450)
RBC: 4.85 x10E6/uL (ref 3.77–5.28)
RDW: 17.2 % — ABNORMAL HIGH (ref 12.3–15.4)
WBC: 13 x10E3/uL — ABNORMAL HIGH (ref 3.4–10.8)

## 2018-02-26 LAB — BASIC METABOLIC PANEL WITH GFR
BUN/Creatinine Ratio: 34 — ABNORMAL HIGH (ref 12–28)
BUN: 27 mg/dL (ref 8–27)
CO2: 24 mmol/L (ref 20–29)
Calcium: 9.1 mg/dL (ref 8.7–10.3)
Chloride: 98 mmol/L (ref 96–106)
Creatinine, Ser: 0.8 mg/dL (ref 0.57–1.00)
GFR calc Af Amer: 91 mL/min/1.73
GFR calc non Af Amer: 79 mL/min/1.73
Glucose: 92 mg/dL (ref 65–99)
Potassium: 4.1 mmol/L (ref 3.5–5.2)
Sodium: 140 mmol/L (ref 134–144)

## 2018-02-26 NOTE — Telephone Encounter (Signed)
Cecille Rubin from [facility] is requesting verbal authorization for nursing home health visits. Would also like to speak about medication refills. Please follow up with Cecille Rubin [phone].

## 2018-02-27 NOTE — Progress Notes (Signed)
Subjective:    Patient ID: Tracy Flynn, female    DOB: July 18, 1955, 62 y.o.   MRN: [id number]  HPI       Patient is a 62 year old female who is seen status post recent hospital discharge on 02/20/2018.  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  Patient also with ascites, thrombocytosis, anasarca, severe protein calorie nutrition, normocytic anemia, leukocytosis, acute UTI in addition to her shortness of breath.  At today's visit, patient states that she feels much better status post removal of fluid from her lungs and abdomen during her recent hospitalization.  Patient states that she did not wish to have catheters placed to help with drainage from her lungs or abdomen.  Patient is accompanied by her son at today's visit and patient and son state that they did talk to palliative care during her hospitalization.  Per son, he wonders if I am on the same team as the people in the hospital as he states that everyone seems to be indicating a negative outcome for his mother.  Both mother and son state that they were told during the hospitalization that chemotherapy would no longer be helpful to treat patient's metastatic cancer.  Son and patient feel that patient will be healed naturally.      Patient requests a refill of antibiotics that she was prescribed for treatment of a bladder infection.  Patient states that she continues to have urinary frequency.  Patient did not seem to realize that she is also on a fluid pill, torsemide, which she was started on at hospital discharge in place of prior Lasix/furosemide.  Patient denies any current fever or chills.  Patient continues to have generalized abdominal discomfort which she attributes to ascites.  Patient states however this is  much improved since a great deal of fluid was removed during her hospitalization.  Patient also feels less short of breath than she did prior to her hospitalization and patient states that there is now minimal swelling in her legs.  Patient denies any headache or dizziness.  Patient does have some fatigue.  Patient does feel more energetic since receiving an iron transfusion during her hospitalization. Past Medical History:  Diagnosis Date  . Pleural effusion   . Uterine fibroid   Per hospital discharge, patient also has metastatic cancer, recurrent left-sided effusion secondary to malignancy, malignant ascites, recent acute hypoxic respiratory failure secondary to pleural effusion, leukocytosis and acute UTI, iron deficiency anemia, severe protein calorie malnutrition, chronic thrombocytosis and anasarca/third spacing secondary to hypoalbuminemia.  Review of Systems  Constitutional: Positive for fatigue. Negative for chills and fever.  Cardiovascular: Positive for chest pain, palpitations and leg swelling (Improved).  Gastrointestinal: Positive for abdominal distention (Improved status post removal of fluid) and abdominal pain (Improved).  Genitourinary: Positive for frequency. Negative for dysuria.  Musculoskeletal: Positive for arthralgias. Negative for back pain and gait problem.  Neurological: Negative for dizziness and headaches.       Objective:   Physical Exam  BP 128/87 (BP Location: Left Arm, Patient Position: Sitting, Cuff Size: Normal)   Pulse (!) 105   Temp 98.2 F (36.8 C) (Oral)   Resp 20   Ht 5\' 11"  (1.803 m)   Wt 145 lb 12.8 oz (66.1 kg)   SpO2 100%   BMI 20.33 kg/m  Vital  signs and nurse's note reviewed General- thin appearing female who appears slightly older than stated age in no acute distress.  Patient is accompanied her adult son and granddaughter at today's visit Neck-supple, no lymphadenopathy, no JVD Lungs- clear to auscultation  bilaterally Cardiovascular-regular rate and rhythm Abdomen- mild abdominal distention, soft, nontender, presence of caput medusa Back-no CVA tenderness Extremities- patient with bilateral nonpitting lower extremity edema Mood- normal mood and judgment, patient seems happy at today's visit     Assessment & Plan:  1. Cancer, metastatic [facility]) Patient's hospital notes from her recent hospitalization as well as labs and imaging were reviewed and these were discussed with the patient and her son.  Patient's son was not aware that patient did have endometrial biopsy done per her gynecologist shortly before her hospital admission.  Also discussed with the son that the fluid removed from the lungs and abdomen did contain malignant cells.  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  Patient again is not interested in either procedure at this time.  I will schedule patient for consultation by IR in the next 1 to 2 weeks so that she can have removal of fluid if significant fluid his reaccumulated at that time.  Patient was also not  interested in hospice referral or even having someone from hospice contact her to discuss the program.  Patient will have labs including BMP and CBC in follow-up of her recent hospitalization and her long-term use of high-risk medications including her torsemide to help with the pleural effusion and ascites as well as lower extremity edema. - Basic Metabolic Panel - CBC with Differential  2. Malignant pleural effusion: Left Will make referral to interventional radiology as patient will likely need intermittent thoracentesis for removal of lung fluid as well as paracentesis for removal of malignant ascitic fluid. - Basic Metabolic Panel  3. Acute lower UTI Patient was treated during hospitalization and afterwards with Keflex for urinary tract infection.  Patient states that she feels as if she needs additional antibiotic.  Patient was asked to give sample for urinalysis and this will be sent for culture to see if a change in antibiotic therapy as needed but patient was given refill of Keflex in the interim. - POCT URINALYSIS DIP (CLIN[facility]K)  4. Iron deficiency anemia, unspecified iron deficiency anemia type Patient will have CBC done in follow-up of iron deficiency anemia status post iron transfusion during her recent hospitalization. - CBC with Differential  5. Encounter for long-term (current) use of medications Patient will have BMP to monitor electrolyte status with her use of torsemide to help with edema - Basic  Metabolic Panel  6. Urinary frequency Patient with complaint of urinary frequency but she is also on diuretic medication.  Patient however has had recent urinary tract infection.  Patient was asked to give sample for urinalysis and this will be sent for culture to see if any additional antibiotic therapy as needed.  Patient will also have BMP to look for elevated glucose or other abnormality that may be contributing to her urinary frequency or may arise as a result of her urinary  frequency such as hyponatremia or hypokalemia - POCT URINALYSIS DIP (CLINITEK) - Basic Metabolic Panel - Urine Culture  7.  Severe protein calorie malnutrition Per hospital discharge summary, nutritional supplements were advised and patient may possibly be receiving these through home health but if not, will see what resources are available to help patient obtain the supplements.  8.  Malignant ascites Patient with recurrent malignant ascites of the abdomen due to metastatic cancer which is thought to be ovarian or uterine in origin.  Patient did have abnormal endometrial biopsy of the uterus done per her GYN consistent with uterine cancer and findings on imaging also suggest ovarian malignancy.  Patient is being referred to interventional radiology for periodic paracentesis for removal of fluid for patient comfort.  9.  DNR Per palliative care consultation, hospital discharge records indicate that patient is now DNR.  Will check with home health to make sure that patient has proper documentation that is in a visible location to healthcare providers/first responders.  An After Visit Summary was printed and given to the patient.  Return in about 3 weeks (around 03/18/2018).

## 2018-03-01 ENCOUNTER — Encounter (HOSPITAL_COMMUNITY): Payer: Self-pay | Admitting: *Deleted

## 2018-03-01 ENCOUNTER — Other Ambulatory Visit: Payer: Self-pay

## 2018-03-01 ENCOUNTER — Emergency Department (HOSPITAL_COMMUNITY): Payer: Self-pay

## 2018-03-01 ENCOUNTER — Other Ambulatory Visit: Payer: Self-pay | Admitting: Family Medicine

## 2018-03-01 ENCOUNTER — Emergency Department (HOSPITAL_COMMUNITY)
Admission: EM | Admit: 2018-03-01 | Discharge: 2018-03-01 | Disposition: A | Discharge status: Home / Self Care | Payer: Self-pay | Attending: Emergency Medicine | Admitting: Emergency Medicine

## 2018-03-01 DIAGNOSIS — R0602 Shortness of breath: Secondary | ICD-10-CM

## 2018-03-01 DIAGNOSIS — R180 Malignant ascites: Secondary | ICD-10-CM

## 2018-03-01 DIAGNOSIS — Z87891 Personal history of nicotine dependence: Secondary | ICD-10-CM | POA: Insufficient documentation

## 2018-03-01 DIAGNOSIS — J910 Malignant pleural effusion: Secondary | ICD-10-CM

## 2018-03-01 DIAGNOSIS — J90 Pleural effusion, not elsewhere classified: Secondary | ICD-10-CM | POA: Insufficient documentation

## 2018-03-01 LAB — CBC WITH DIFFERENTIAL/PLATELET
BASOS ABS: 0 10*3/uL (ref 0.0–0.1)
BASOS PCT: 0 %
EOS ABS: 0 10*3/uL (ref 0.0–0.7)
Eosinophils Relative: 0 %
HCT: 40.9 % (ref 36.0–46.0)
Hemoglobin: 12.5 g/dL (ref 12.0–15.0)
Lymphocytes Relative: 5 %
Lymphs Abs: 0.7 10*3/uL (ref 0.7–4.0)
MCH: 24.7 pg — AB (ref 26.0–34.0)
MCHC: 30.6 g/dL (ref 30.0–36.0)
MCV: 80.7 fL (ref 78.0–100.0)
Monocytes Absolute: 1.1 10*3/uL — ABNORMAL HIGH (ref 0.1–1.0)
Monocytes Relative: 7 %
NEUTROS PCT: 88 %
Neutro Abs: 14.1 10*3/uL — ABNORMAL HIGH (ref 1.7–7.7)
Platelets: 622 10*3/uL — ABNORMAL HIGH (ref 150–400)
RBC: 5.07 MIL/uL (ref 3.87–5.11)
RDW: 19.2 % — ABNORMAL HIGH (ref 11.5–15.5)
WBC: 16.1 10*3/uL — AB (ref 4.0–10.5)

## 2018-03-01 LAB — COMPREHENSIVE METABOLIC PANEL
ALBUMIN: 2.5 g/dL — AB (ref 3.5–5.0)
ALT: 43 U/L (ref 0–44)
AST: 38 U/L (ref 15–41)
Alkaline Phosphatase: 400 U/L — ABNORMAL HIGH (ref 38–126)
Anion gap: 12 (ref 5–15)
BUN: 35 mg/dL — ABNORMAL HIGH (ref 8–23)
CHLORIDE: 98 mmol/L (ref 98–111)
CO2: 28 mmol/L (ref 22–32)
CREATININE: 1.17 mg/dL — AB (ref 0.44–1.00)
Calcium: 9 mg/dL (ref 8.9–10.3)
GFR calc non Af Amer: 49 mL/min — ABNORMAL LOW (ref >60)
GFR, EST AFRICAN AMERICAN: 57 mL/min — AB (ref >60)
GLUCOSE: 106 mg/dL — AB (ref 70–99)
Potassium: 4.3 mmol/L (ref 3.5–5.1)
SODIUM: 138 mmol/L (ref 135–145)
Total Bilirubin: 0.4 mg/dL (ref 0.3–1.2)
Total Protein: 8.1 g/dL (ref 6.5–8.1)

## 2018-03-01 LAB — TROPONIN I: TROPONIN I: 0.08 ng/mL — AB (ref <0.03)

## 2018-03-01 LAB — LIPASE, BLOOD: Lipase: 26 U/L (ref 11–51)

## 2018-03-01 MED ORDER — IOPAMIDOL (ISOVUE-370) INJECTION 76%
INTRAVENOUS | Status: AC
  Filled 2018-03-01: qty 100

## 2018-03-01 MED ORDER — ALBUTEROL SULFATE (2.5 MG/3ML) 0.083% IN NEBU
5.0000 mg | INHALATION_SOLUTION | Freq: Once | RESPIRATORY_TRACT | Status: DC
  Filled 2018-03-01: qty 6

## 2018-03-01 MED ORDER — IOPAMIDOL (ISOVUE-370) INJECTION 76%
100.0000 mL | Freq: Once | INTRAVENOUS | Status: AC | PRN
  Administered 2018-03-01: 100 mL via INTRAVENOUS

## 2018-03-01 NOTE — Progress Notes (Signed)
Order placed for IR guided paracentesis due to recurrent abdominal fluid collection due to malignant ascites and order placed for IR guided thoracentesis due to recurrent fluid collection due to malignant left pleural effusion.

## 2018-03-01 NOTE — Discharge Instructions (Addendum)
Please report to [facility] main lobby and request directions to outpatient radiology. Please report to your appointments 40mins early. Your Thoracentesis is scheduled for tomorrow at 2pm. Your Paracentesis is scheduled for Wednesday at 10am.   Return to the ED immediately for new or worsening symptoms, such as worsening shortness of breath, fever, chest pain, vomiting or any concerns at all.   Please call call the [facility]

## 2018-03-01 NOTE — ED Triage Notes (Signed)
Pt presents with ? Fluid in lungs and abd swelling, feeling of fluid started on Sat.

## 2018-03-01 NOTE — ED Notes (Signed)
Phlebotomy made aware labs are needed

## 2018-03-01 NOTE — ED Provider Notes (Addendum)
[facility] DEPT Provider Note   CSN: [id number] Arrival date & time: 03/01/18  1114     History   Chief Complaint Chief Complaint  Patient presents with  . Shortness of Breath    HPI Tracy Flynn is a 62 y.o. female, with a PMH of metastatic cancer with recurrent ascites and pleural effusions, presents with a 3 day history of worsening SOB. Pt states abdominal fullness for the last 3 days which has also been worsening. Pt states symptoms today feel the same as when she had to have fluid drawn off of her abdomin. Pt denies any fever, chills, CP, N/V, diarrhea, constipation.  HPI  Past Medical History:  Diagnosis Date  . Pleural effusion   . Uterine fibroid     Patient Active Problem List   Diagnosis Date Noted  . Dyspnea   . Palliative care by specialist   . Acute lower UTI 02/16/2018  . Acute respiratory failure with hypoxia ([place]) 02/15/2018  . Malignant pleural effusion: Left 02/15/2018  . Ascites 02/15/2018  . Leukocytosis 02/15/2018  . Cancer, metastatic ([place])   . Goals of care, counseling/discussion   . Palliative care encounter   . Recurrent pleural effusion on left 01/13/2018  . Anasarca 01/12/2018  . Severe protein-calorie malnutrition ([place]) 01/12/2018  . Normochromic normocytic anemia 01/12/2018  . Cyst of ovary   . 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 Mesenteric lymphadenopathy 11/27/2017  . Liver mass 11/27/2017  . Sinus tachycardia 11/27/2017  . Pleural effusion on left 11/27/2017  . Pleural effusion 11/27/2017  . Anemia 11/27/2017  . Thrombocytosis ([place]) 11/27/2017    Past Surgical History:  Procedure Laterality Date  . HERNIA REPAIR    . IR PARACENTESIS  01/15/2018  . IR THORACENTESIS ASP PLEURAL SPACE W/IMG GUIDE  12/11/2017  . IR THORACENTESIS ASP PLEURAL SPACE W/IMG GUIDE  12/29/2017  . IR THORACENTESIS ASP PLEURAL SPACE W/IMG GUIDE  01/13/2018     OB History   None      Home Medications    Prior to Admission  medications   Medication Sig Start Date End Date Taking? Authorizing Provider  cephALEXin (KEFLEX) 500 MG capsule Take 1 capsule (500 mg total) by mouth 2 (two) times daily for 7 days. 02/25/18 03/04/18  Fulp, Cammie, MD  cetirizine (ZYRTEC) 10 MG tablet Take 1 tablet (10 mg total) by mouth daily. Patient taking differently: Take 10 mg by mouth daily as needed for allergies.  12/10/17   Brayton Caves, PA-C  docusate sodium (COLACE) 100 MG capsule Take 1 capsule (100 mg total) by mouth 2 (two) times daily. 02/20/18   Lavina Hamman, MD  ferrous sulfate 325 (65 FE) MG tablet Take 1 tablet (325 mg total) by mouth 2 (two) times daily with a meal. 02/25/18   Fulp, Cammie, MD  folic acid (FOLVITE) 1 MG tablet Take 1 tablet (1 mg total) by mouth daily. 01/17/18   Georgette Shell, MD  lactulose (CHRONULAC) 10 GM/15ML solution Take 45 mLs (30 g total) by mouth daily. Patient not taking: Reported on 02/25/2018 02/21/18   Lavina Hamman, MD  OVER THE COUNTER MEDICATION Apply 1 application topically 2 (two) times daily as needed (pain).    [provider]  polyethylene glycol (MIRALAX / GLYCOLAX) packet Take 17 g by mouth daily. 02/21/18   Lavina Hamman, MD  potassium chloride (K-DUR) 10 MEQ tablet Take 2 tablets (20 mEq total) by mouth daily. 02/20/18   Lavina Hamman, MD  torsemide (DEMADEX) 20 MG tablet Take 40 mg daily till 02/25/2018, take 20 mg daily after that and can take 20 mg extra for weight gain of 3 lbs in 1 day. Patient not taking: Reported on 02/25/2018 02/20/18   Lavina Hamman, MD  vitamin B-12 100 MCG tablet Take 1 tablet (100 mcg total) by mouth daily. Patient not taking: Reported on 02/25/2018 01/17/18   Georgette Shell, MD    Family History Family History  Problem Relation Age of Onset  . Cancer Father     Social History Social History   Tobacco Use  . Smoking status: Former Research scientist (life sciences)  . Smokeless tobacco: Never Used  Substance Use Topics  . Alcohol use: Never     Frequency: Never  . Drug use: Never     Allergies   Codeine and Penicillins   Review of Systems Review of Systems  Constitutional: Negative for chills and fever.  Respiratory: Positive for shortness of breath. Negative for cough and wheezing.   Cardiovascular: Negative for chest pain.  Gastrointestinal: Positive for abdominal distention ("fullness feeling"). Negative for abdominal pain, constipation, diarrhea, nausea and vomiting.  Genitourinary: Negative for dysuria.     Physical Exam Updated Vital Signs BP (!) 125/91 (BP Location: Right Arm)   Pulse (!) 104   Temp 97.6 F (36.4 C)   Resp (!) 27   Ht 5\' 11"  (1.803 m)   Wt 66.7 kg   SpO2 100%   BMI 20.50 kg/m   Physical Exam  Constitutional: She is oriented to person, place, and time.  HENT:  Head: Normocephalic and atraumatic.  Eyes: Conjunctivae and EOM are normal.  Neck: Neck supple.  Cardiovascular: Regular rhythm and normal heart sounds.  No murmur heard. tachycardic  Pulmonary/Chest: Effort normal. No tachypnea. No respiratory distress. She has decreased breath sounds in the left lower field. She has no wheezes.  Abdominal: Bowel sounds are normal. She exhibits distension. There is no tenderness. There is no rebound and no guarding.  Musculoskeletal: Normal range of motion. She exhibits no tenderness or deformity.  Neurological: She is alert and oriented to person, place, and time.  Skin: Skin is warm and dry. No rash noted. No erythema.  Nursing note and vitals reviewed.    ED Treatments / Results  Labs (all labs ordered are listed, but only abnormal results are displayed) Labs Reviewed  CBC WITH DIFFERENTIAL/PLATELET  COMPREHENSIVE METABOLIC PANEL  LIPASE, BLOOD  URINALYSIS, ROUTINE W REFLEX MICROSCOPIC  TROPONIN I    EKG None  Radiology Dg Chest 2 View  Result Date: 03/01/2018 CLINICAL DATA:  Abdominal pain and swelling. History of ovarian cancer and pleural effusions. EXAM: CHEST - 2 VIEW  COMPARISON:  Single-view of the chest 02/19/2018. FINDINGS: Large left pleural effusion results in near complete whiteout of the left chest, unchanged. Trace right pleural effusion is noted. Compressive atelectasis is seen on the left. No pneumothorax. Cardiac silhouette is obscured. Scoliosis is noted. IMPRESSION: No change in a very large left pleural effusion. Trace right pleural effusion is also noted. Electronically Signed   By: Inge Rise M.D.   On: 03/01/2018 12:34    Procedures Procedures (including critical care time)  Medications Ordered in ED Medications - No data to display   Initial Impression / Assessment and Plan / ED Course  I have reviewed the triage vital signs and the nursing notes.  Pertinent labs & imaging results that were available during my care of the patient were reviewed by me and considered  in my medical decision making (see chart for details).     4:10 PM Pt resting comfortably in bed, no acute distress, nontoxic. Pt is not tachypnic and is maintaining her O2 >94%. Pt does remain slightly tachycardic which is chronic for her and has been present on her last 2 admissions. Pt's abdomen is nontender, do not feel this is SBP. No emergent medical conditions identified at this time. Pt's trop is also chronically elevated at approx 0.08. This is not felt to be an acute change. Pt's H&P is most consistent with her pleural effusion.  In depth conversation with Pt, son and Dr. Venora Maples about pt's medical condition. Offered to admit for PleurX vs. Scheduling outpt procedures. After discussion, pt chooses to have outpt procedures. These were scheduled directly with [place].  Final Clinical Impressions(s) / ED Diagnoses   Final diagnoses:  None    ED Discharge Orders    None       Etter Sjogren, PA-C 03/01/18 [address], PA-C 03/01/18 1617    Jola Schmidt, MD 03/01/18 [id number]

## 2018-03-02 ENCOUNTER — Ambulatory Visit (HOSPITAL_COMMUNITY)
Admission: RE | Admit: 2018-03-02 | Discharge: 2018-03-02 | Disposition: A | Discharge status: Home / Self Care | Payer: Self-pay | Source: Ambulatory Visit | Attending: Radiology | Admitting: Radiology

## 2018-03-02 ENCOUNTER — Other Ambulatory Visit: Payer: Self-pay | Admitting: Oncology

## 2018-03-02 ENCOUNTER — Emergency Department (HOSPITAL_COMMUNITY)
Admission: EM | Admit: 2018-03-02 | Discharge: 2018-03-02 | Disposition: A | Discharge status: Home / Self Care | Payer: Self-pay | Source: Home / Self Care

## 2018-03-02 DIAGNOSIS — Z9889 Other specified postprocedural states: Secondary | ICD-10-CM

## 2018-03-02 MED ORDER — LIDOCAINE HCL 1 % IJ SOLN
INTRAMUSCULAR | Status: AC
  Filled 2018-03-02: qty 20

## 2018-03-02 NOTE — Telephone Encounter (Addendum)
Pt was called and is aware of her disability Pacecard form being completed and is at our front desk ready for pickup  *a copy was made and scanned under her documents tab*

## 2018-03-02 NOTE — Progress Notes (Signed)
Tracy Flynn was seen in the emergency room yesterday.  The ED physician suggested a hospice referral which seems to be entirely appropriate.  They suggested the patient contact our cancer center for an appointment.  I went into the chart and noted that we have callED this patient multiple times trying to set up an appointment and she has never returned the calls.  The patient's family has also been contacted again without ever being able to get an appointment.  Nevertheless I have given the information to Santiago Glad has the navigator for the OB/GYN  Program, to see if the patient would want to be seen at this point even though probably all that will happen his referral to hospice.

## 2018-03-02 NOTE — Procedures (Addendum)
Ultrasound-guided  therapeutic left thoracentesis performed yielding 1.2 liters of hazy, brown fluid. No immediate complications. Follow-up chest x-ray pending. Due to pt chest discomfort only the above amount of fluid was removed today.

## 2018-03-03 ENCOUNTER — Ambulatory Visit (HOSPITAL_COMMUNITY)
Admission: EM | Admit: 2018-03-03 | Discharge: 2018-03-03 | Disposition: A | Discharge status: Home / Self Care | Payer: Self-pay | Attending: Emergency Medicine | Admitting: Emergency Medicine

## 2018-03-03 DIAGNOSIS — R180 Malignant ascites: Secondary | ICD-10-CM | POA: Insufficient documentation

## 2018-03-03 MED ORDER — LIDOCAINE HCL 1 % IJ SOLN
INTRAMUSCULAR | Status: AC
  Filled 2018-03-03: qty 20

## 2018-03-03 NOTE — Telephone Encounter (Signed)
-----   Message from Antony Blackbird, MD sent at 02/26/2018  8:24 AM EDT ----- Please notify patient that her white blood cell count was elevated-please the antibiotic keflex while your urine culture is pending. The BMP was normal

## 2018-03-03 NOTE — Procedures (Signed)
Ultrasound-guided  therapeutic paracentesis performed yielding 5.5 liters (maximum ordered) of chylous/milky fluid. No immediate complications.

## 2018-03-03 NOTE — Telephone Encounter (Signed)
Patient was called, answered, verified dob, and was informed of most recent lab results. Patient verbalized understanding and had no further questions.

## 2018-03-09 ENCOUNTER — Other Ambulatory Visit: Payer: Self-pay | Admitting: Family Medicine

## 2018-03-09 NOTE — Telephone Encounter (Signed)
Patient called asking for prescription refills.Please follow up with patient.  Potassium chloride 10 MEQ tablet Torsemide 20 mg CETHALUXIN 500 mg Docusate sodium 100 mg  traMADol 50 mg

## 2018-03-10 MED ORDER — POTASSIUM CHLORIDE ER 10 MEQ PO TBCR: 20.0000 meq | EXTENDED_RELEASE_TABLET | Freq: Every day | ORAL | 0 refills | Status: AC

## 2018-03-10 MED ORDER — TORSEMIDE 20 MG PO TABS: ORAL_TABLET | ORAL | 0 refills | Status: AC

## 2018-03-12 ENCOUNTER — Other Ambulatory Visit: Payer: Self-pay

## 2018-03-12 ENCOUNTER — Inpatient Hospital Stay (HOSPITAL_COMMUNITY)
Admission: EM | Admit: 2018-03-12 | Discharge: 2018-04-23 | DRG: 754 | Disposition: E | Payer: Self-pay | Attending: Pulmonary Disease | Admitting: Pulmonary Disease

## 2018-03-12 ENCOUNTER — Emergency Department (HOSPITAL_COMMUNITY): Payer: Self-pay

## 2018-03-12 ENCOUNTER — Encounter (HOSPITAL_COMMUNITY): Payer: Self-pay | Admitting: Emergency Medicine

## 2018-03-12 DIAGNOSIS — D4959 Neoplasm of unspecified behavior of other genitourinary organ: Secondary | ICD-10-CM | POA: Diagnosis present

## 2018-03-12 DIAGNOSIS — F329 Major depressive disorder, single episode, unspecified: Secondary | ICD-10-CM | POA: Diagnosis present

## 2018-03-12 DIAGNOSIS — E8770 Fluid overload, unspecified: Secondary | ICD-10-CM | POA: Diagnosis present

## 2018-03-12 DIAGNOSIS — J910 Malignant pleural effusion: Secondary | ICD-10-CM | POA: Diagnosis present

## 2018-03-12 DIAGNOSIS — J9601 Acute respiratory failure with hypoxia: Secondary | ICD-10-CM | POA: Diagnosis not present

## 2018-03-12 DIAGNOSIS — R627 Adult failure to thrive: Secondary | ICD-10-CM | POA: Diagnosis present

## 2018-03-12 DIAGNOSIS — R0602 Shortness of breath: Secondary | ICD-10-CM

## 2018-03-12 DIAGNOSIS — G9341 Metabolic encephalopathy: Secondary | ICD-10-CM | POA: Diagnosis present

## 2018-03-12 DIAGNOSIS — D62 Acute posthemorrhagic anemia: Secondary | ICD-10-CM | POA: Diagnosis present

## 2018-03-12 DIAGNOSIS — E875 Hyperkalemia: Secondary | ICD-10-CM | POA: Diagnosis present

## 2018-03-12 DIAGNOSIS — C787 Secondary malignant neoplasm of liver and intrahepatic bile duct: Secondary | ICD-10-CM | POA: Diagnosis present

## 2018-03-12 DIAGNOSIS — C569 Malignant neoplasm of unspecified ovary: Principal | ICD-10-CM | POA: Diagnosis present

## 2018-03-12 DIAGNOSIS — Z87891 Personal history of nicotine dependence: Secondary | ICD-10-CM

## 2018-03-12 DIAGNOSIS — J90 Pleural effusion, not elsewhere classified: Secondary | ICD-10-CM

## 2018-03-12 DIAGNOSIS — Z6820 Body mass index (BMI) 20.0-20.9, adult: Secondary | ICD-10-CM

## 2018-03-12 DIAGNOSIS — Z79899 Other long term (current) drug therapy: Secondary | ICD-10-CM

## 2018-03-12 DIAGNOSIS — R180 Malignant ascites: Secondary | ICD-10-CM | POA: Diagnosis present

## 2018-03-12 DIAGNOSIS — K219 Gastro-esophageal reflux disease without esophagitis: Secondary | ICD-10-CM | POA: Diagnosis present

## 2018-03-12 DIAGNOSIS — R1110 Vomiting, unspecified: Secondary | ICD-10-CM

## 2018-03-12 DIAGNOSIS — R402434 Glasgow coma scale score 3-8, 24 hours or more after hospital admission: Secondary | ICD-10-CM | POA: Diagnosis not present

## 2018-03-12 DIAGNOSIS — D72829 Elevated white blood cell count, unspecified: Secondary | ICD-10-CM | POA: Diagnosis present

## 2018-03-12 DIAGNOSIS — R4020 Unspecified coma: Secondary | ICD-10-CM | POA: Diagnosis not present

## 2018-03-12 DIAGNOSIS — N179 Acute kidney failure, unspecified: Secondary | ICD-10-CM | POA: Diagnosis present

## 2018-03-12 DIAGNOSIS — Z09 Encounter for follow-up examination after completed treatment for conditions other than malignant neoplasm: Secondary | ICD-10-CM

## 2018-03-12 DIAGNOSIS — C799 Secondary malignant neoplasm of unspecified site: Secondary | ICD-10-CM | POA: Diagnosis present

## 2018-03-12 DIAGNOSIS — E162 Hypoglycemia, unspecified: Secondary | ICD-10-CM | POA: Diagnosis not present

## 2018-03-12 DIAGNOSIS — Z809 Family history of malignant neoplasm, unspecified: Secondary | ICD-10-CM

## 2018-03-12 DIAGNOSIS — Z01818 Encounter for other preprocedural examination: Secondary | ICD-10-CM

## 2018-03-12 DIAGNOSIS — E872 Acidosis: Secondary | ICD-10-CM | POA: Diagnosis present

## 2018-03-12 DIAGNOSIS — E43 Unspecified severe protein-calorie malnutrition: Secondary | ICD-10-CM | POA: Diagnosis present

## 2018-03-12 DIAGNOSIS — R578 Other shock: Secondary | ICD-10-CM | POA: Diagnosis not present

## 2018-03-12 DIAGNOSIS — J9383 Other pneumothorax: Secondary | ICD-10-CM | POA: Diagnosis not present

## 2018-03-12 DIAGNOSIS — R000 Tachycardia, unspecified: Secondary | ICD-10-CM | POA: Diagnosis not present

## 2018-03-12 DIAGNOSIS — J939 Pneumothorax, unspecified: Secondary | ICD-10-CM

## 2018-03-12 DIAGNOSIS — Z66 Do not resuscitate: Secondary | ICD-10-CM | POA: Diagnosis present

## 2018-03-12 DIAGNOSIS — R188 Other ascites: Secondary | ICD-10-CM | POA: Diagnosis present

## 2018-03-12 DIAGNOSIS — Z515 Encounter for palliative care: Secondary | ICD-10-CM | POA: Diagnosis not present

## 2018-03-12 DIAGNOSIS — Z978 Presence of other specified devices: Secondary | ICD-10-CM

## 2018-03-12 LAB — CBC
HEMATOCRIT: 40.5 % (ref 36.0–46.0)
HEMOGLOBIN: 13 g/dL (ref 12.0–15.0)
MCH: 25 pg — AB (ref 26.0–34.0)
MCHC: 32.1 g/dL (ref 30.0–36.0)
MCV: 78 fL (ref 78.0–100.0)
Platelets: 660 10*3/uL — ABNORMAL HIGH (ref 150–400)
RBC: 5.19 MIL/uL — ABNORMAL HIGH (ref 3.87–5.11)
RDW: 18.2 % — ABNORMAL HIGH (ref 11.5–15.5)
WBC: 18.5 10*3/uL — ABNORMAL HIGH (ref 4.0–10.5)

## 2018-03-12 LAB — POCT I-STAT TROPONIN I: Troponin i, poc: 0 ng/mL (ref 0.00–0.08)

## 2018-03-12 NOTE — ED Notes (Signed)
I attempted to collect labs and was unsuccessful. 

## 2018-03-12 NOTE — ED Provider Notes (Signed)
[facility] DEPT Provider Note   CSN: [id number] Arrival date & time: 02/23/2018  2057     History   Chief Complaint Chief Complaint  Patient presents with  . Shortness of Breath    HPI Tracy Flynn is a 62 y.o. female.  Tracy Flynn is a 62 y.o. Female with a history of metastatic ovarian cancer and chronic pleural effusions and ascites, anasarca, severe malnutrition sinus tachycardia, who presents to the emergency department for evaluation of 1 week of worsening shortness of breath.  She reports she feels like the fluid is built back up on her left lung she has some pain over the left side.  She reports her shortness of breath is present at rest and with exertion and she feels like she is suffocating.  Is not requiring any oxygen at this time but feels like she is having to work hard to breathe.  She also reports her abdomen has become increasingly distended it is not painful just feels tight, she reports she typically has to have fluid drained from both of these areas.  She denies any associated fevers or chills.  No cough, no nausea or vomiting.  No urinary symptoms.  She typically takes tramadol at home for pain and has not had any today and reports pain over the left side of her chest is getting worse.  No other aggravating or alleviating factors.  She reports her trying to get a Pleurx catheter put in place for her recurrent effusions but has not been able to get this procedure done yet.     Past Medical History:  Diagnosis Date  . Pleural effusion   . Uterine fibroid     Patient Active Problem List   Diagnosis Date Noted  . Dyspnea   . Palliative care by specialist   . Acute lower UTI 02/16/2018  . Acute respiratory failure with hypoxia ([place]) 02/15/2018  . Malignant pleural effusion: Left 02/15/2018  . Ascites 02/15/2018  . Leukocytosis 02/15/2018  . Cancer, metastatic ([place])   . Goals of care, counseling/discussion   . Palliative care  encounter   . Recurrent pleural effusion on left 01/13/2018  . Anasarca 01/12/2018  . Severe protein-calorie malnutrition ([place]) 01/12/2018  . Normochromic normocytic anemia 01/12/2018  . Cyst of ovary   . 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 Mesenteric lymphadenopathy 11/27/2017  . Liver mass 11/27/2017  . Sinus tachycardia 11/27/2017  . Pleural effusion on left 11/27/2017  . Pleural effusion 11/27/2017  . Anemia 11/27/2017  . Thrombocytosis ([place]) 11/27/2017    Past Surgical History:  Procedure Laterality Date  . HERNIA REPAIR    . IR PARACENTESIS  01/15/2018  . IR THORACENTESIS ASP PLEURAL SPACE W/IMG GUIDE  12/11/2017  . IR THORACENTESIS ASP PLEURAL SPACE W/IMG GUIDE  12/29/2017  . IR THORACENTESIS ASP PLEURAL SPACE W/IMG GUIDE  01/13/2018     OB History   None      Home Medications    Prior to Admission medications   Medication Sig Start Date End Date Taking? Authorizing Provider  cetirizine (ZYRTEC) 10 MG tablet Take 1 tablet (10 mg total) by mouth daily. Patient taking differently: Take 10 mg by mouth daily as needed for allergies.  12/10/17   Brayton Caves, PA-C  docusate sodium (COLACE) 100 MG capsule Take 1 capsule (100 mg total) by mouth 2 (two) times daily. 02/20/18   Lavina Hamman, MD  ferrous sulfate 325 (65 FE) MG tablet Take 1 tablet (325 mg total) by  mouth 2 (two) times daily with a meal. 02/25/18   Fulp, Cammie, MD  folic acid (FOLVITE) 1 MG tablet Take 1 tablet (1 mg total) by mouth daily. 01/17/18   Georgette Shell, MD  lactulose (CHRONULAC) 10 GM/15ML solution Take 45 mLs (30 g total) by mouth daily. 02/21/18   Lavina Hamman, MD  polyethylene glycol [facility] Floria Raveling) packet Take 17 g by mouth daily. Patient not taking: Reported on 03/01/2018 02/21/18   Lavina Hamman, MD  potassium chloride (K-DUR) 10 MEQ tablet Take 2 tablets (20 mEq total) by mouth daily. MUST MAKE APPT FOR FURTHER REFILLS 03/10/18   Fulp, Cammie, MD  torsemide (DEMADEX) 20 MG tablet Take 20 mg  by mouth daily, can take 20 mg extra for weight gain of 3 lbs in 1 day. MUST MAKE APPT FOR FURTHER REFILLS 03/10/18   Fulp, Cammie, MD  traMADol (ULTRAM) 50 MG tablet Take 100 mg by mouth every 5 (five) hours as needed for moderate pain.    [provider]  vitamin B-12 100 MCG tablet Take 1 tablet (100 mcg total) by mouth daily. Patient not taking: Reported on 02/25/2018 01/17/18   Georgette Shell, MD    Family History Family History  Problem Relation Age of Onset  . Cancer Father     Social History Social History   Tobacco Use  . Smoking status: Former Research scientist (life sciences)  . Smokeless tobacco: Never Used  Substance Use Topics  . Alcohol use: Never    Frequency: Never  . Drug use: Never     Allergies   Codeine and Penicillins   Review of Systems Review of Systems  Constitutional: Negative for chills and fever.  HENT: Negative.   Eyes: Negative for visual disturbance.  Respiratory: Positive for shortness of breath. Negative for cough.   Cardiovascular: Positive for chest pain. Negative for palpitations and leg swelling.  Gastrointestinal: Positive for abdominal distention. Negative for abdominal pain, nausea and vomiting.  Genitourinary: Negative for dysuria and frequency.  Musculoskeletal: Negative for arthralgias and myalgias.  Skin: Negative for color change and rash.  Neurological: Negative for dizziness, syncope and light-headedness.     Physical Exam Updated Vital Signs BP 120/84 (BP Location: Right Arm)   Pulse 90   Temp 98.3 F (36.8 C) (Oral)   Resp 13   Ht 5' 11"  (1.803 m)   Wt 66.2 kg   SpO2 100%   BMI 20.36 kg/m   Physical Exam  Constitutional: She is oriented to person, place, and time. She appears well-developed and well-nourished. She does not appear ill. No distress.  Patient is cachectic and chronically-ill-old appearing but in no acute distress  HENT:  Head: Normocephalic and atraumatic.  Mouth/Throat: Oropharynx is clear and moist.  Eyes:  Right eye exhibits no discharge. Left eye exhibits no discharge.  Neck: Normal range of motion. Neck supple. No JVD present. No tracheal deviation present.  Cardiovascular: Regular rhythm, normal heart sounds and intact distal pulses. Exam reveals no gallop and no friction rub.  No murmur heard. Tachycardic  Pulmonary/Chest: Tachypnea noted. No respiratory distress.  Pt is intermittently tachypneic with some mild increased work of breathing but no respiratory distress.  She has decreased lung sounds throughout the left lung field, right lung is clear  Abdominal: She exhibits distension and ascites. There is no tenderness. There is no rebound and no guarding.  Abdomen is distended and tympanic but not focally tender to palpation, no guarding, no peritoneal signs  Neurological: She is  alert and oriented to person, place, and time. Coordination normal.  Skin: Skin is warm and dry. Capillary refill takes less than 2 seconds. She is not diaphoretic.  Psychiatric: She has a normal mood and affect. Her behavior is normal.  Nursing note and vitals reviewed.    ED Treatments / Results  Labs (all labs ordered are listed, but only abnormal results are displayed) Labs Reviewed  BASIC METABOLIC PANEL - Abnormal; Notable for the following components:      Result Value   Potassium 5.2 (*)    Glucose, Bld 103 (*)    BUN 59 (*)    Creatinine, Ser 2.00 (*)    Calcium 8.7 (*)    GFR calc non Af Amer 26 (*)    GFR calc Af Amer 30 (*)    All other components within normal limits  CBC - Abnormal; Notable for the following components:   WBC 18.5 (*)    RBC 5.19 (*)    MCH 25.0 (*)    RDW 18.2 (*)    Platelets 660 (*)    All other components within normal limits  HEPATIC FUNCTION PANEL - Abnormal; Notable for the following components:   Albumin 1.8 (*)    AST 45 (*)    Alkaline Phosphatase 679 (*)    All other components within normal limits  I-STAT TROPONIN, ED  POCT I-STAT TROPONIN I     EKG None  Radiology Dg Chest 2 View  Result Date: 03/11/2018 CLINICAL DATA:  Former smoker with dyspnea beginning last week. EXAM: CHEST - 2 VIEW COMPARISON:  03/01/2018 and CXR 03/02/2018 FINDINGS: Unchanged large left pleural effusion spanning at least 6 vertebral body heights on the lateral view. This pleural effusion obscures much of the left heart border. The aortic arch is nonaneurysmal. Right heart border is obscured by overlap with the thoracic spine. There is dextroconvex curvature of the thoracic spine as before. The right lung is clear though the apex is excluded on the frontal view. Metallic staples project over the superior mediastinum as before. IMPRESSION: Unchanged large left pleural effusion. Electronically Signed   By: Ashley Royalty M.D.   On: 03/04/2018 22:33    Procedures Procedures (including critical care time)  Medications Ordered in ED Medications  traMADol (ULTRAM) tablet 50 mg (50 mg Oral Given 03/13/18 0206)     Initial Impression / Assessment and Plan / ED Course  I have reviewed the triage vital signs and the nursing notes.  Pertinent labs & imaging results that were available during my care of the patient were reviewed by me and considered in my medical decision making (see chart for details).  Patient with metastatic ovarian cancer with recurrent pleural effusions and ascites presents for worsening shortness of breath.  She is tachycardic and tachypneic on arrival vitals otherwise stable, afebrile.  Lung sounds decreased over the left lung and abdomen distended but not focally tender to palpation.  Abdominal exam is not concerning for SBP.  Shortness of breath is present at rest and exertion, she does have some left-sided pleuritic pain.  Patient has been scanned recently for PE and it was negative with very similar presentation.  Patient is mildly tachycardic but on chart review almost always tachycardic when here in the hospital.  Her chest x-ray shows  unchanged large left pleural effusion up to the third rib space.  Patient has been trying to get a Pleurx catheter in place, but has not been able to have this procedure creatinine the  meantime has required repeat thoracentesis and paracentesis.  Her labs are significant for leukocytosis of 18.5, on previous ED visit she has had similar leukocytosis, patient does not have any acute infectious symptoms at this time afebrile, no cough, again abdominal exam is not consistent with SBP.  Patient has mildly elevated potassium of 5.2, no other acute electrolyte derangements, creatinine is 2.0, which is increased from baseline of about 1.2.  LFTs are at baseline aside from alk phos which is 679 slightly more elevated than usual.  Negative troponin.  EKG without concerning ischemic changes.  Tramadol given for pain.  Given significant pleural effusion and the patient is acutely symptomatic here in the emergency department feel she will require admission for thoracentesis and paracentesis.  Discussed with Dr Alcario Drought with [facility] who will see and admit the patient.  Final Clinical Impressions(s) / ED Diagnoses   Final diagnoses:  Malignant pleural effusion  Malignant ascites  Malignant neoplasm of ovary, unspecified laterality [facility])    ED Discharge Orders    None       Jacqlyn Larsen, [place] 03/13/18 0947    Veryl Speak, MD 03/13/18 2333

## 2018-03-12 NOTE — ED Triage Notes (Signed)
Patient presents with Shortness of breath that began last week. Patient stating she had fluid on her lungs and they did not drain her chest, only her abdomen when she was here last.

## 2018-03-13 ENCOUNTER — Observation Stay (HOSPITAL_COMMUNITY): Payer: Self-pay

## 2018-03-13 LAB — BASIC METABOLIC PANEL
ANION GAP: 12 (ref 5–15)
BUN: 59 mg/dL — ABNORMAL HIGH (ref 8–23)
CALCIUM: 8.7 mg/dL — AB (ref 8.9–10.3)
CO2: 23 mmol/L (ref 22–32)
Chloride: 100 mmol/L (ref 98–111)
Creatinine, Ser: 2 mg/dL — ABNORMAL HIGH (ref 0.44–1.00)
GFR calc Af Amer: 30 mL/min — ABNORMAL LOW (ref >60)
GFR calc non Af Amer: 26 mL/min — ABNORMAL LOW (ref >60)
GLUCOSE: 103 mg/dL — AB (ref 70–99)
POTASSIUM: 5.2 mmol/L — AB (ref 3.5–5.1)
Sodium: 135 mmol/L (ref 135–145)

## 2018-03-13 LAB — HEPATIC FUNCTION PANEL
ALK PHOS: 679 U/L — AB (ref 38–126)
ALT: 40 U/L (ref 0–44)
AST: 45 U/L — AB (ref 15–41)
Albumin: 1.8 g/dL — ABNORMAL LOW (ref 3.5–5.0)
BILIRUBIN TOTAL: 0.5 mg/dL (ref 0.3–1.2)
Total Protein: 7.2 g/dL (ref 6.5–8.1)

## 2018-03-13 MED ORDER — ONDANSETRON HCL 4 MG/2ML IJ SOLN
4.0000 mg | Freq: Four times a day (QID) | INTRAMUSCULAR | Status: DC | PRN
  Administered 2018-03-14 – 2018-03-22 (×2): 4 mg via INTRAVENOUS
  Filled 2018-03-13 (×3): qty 2

## 2018-03-13 MED ORDER — LACTULOSE 10 GM/15ML PO SOLN
30.0000 g | Freq: Every day | ORAL | Status: DC
  Administered 2018-03-13 – 2018-03-16 (×4): 30 g via ORAL
  Filled 2018-03-13 (×5): qty 60

## 2018-03-13 MED ORDER — LIDOCAINE HCL 1 % IJ SOLN
INTRAMUSCULAR | Status: AC
  Filled 2018-03-13: qty 20

## 2018-03-13 MED ORDER — TORSEMIDE 20 MG PO TABS
20.0000 mg | ORAL_TABLET | Freq: Every day | ORAL | Status: DC
  Administered 2018-03-13 – 2018-03-15 (×3): 20 mg via ORAL
  Filled 2018-03-13 (×4): qty 1

## 2018-03-13 MED ORDER — ONDANSETRON HCL 4 MG PO TABS: 4.0000 mg | ORAL_TABLET | Freq: Four times a day (QID) | ORAL | Status: DC | PRN

## 2018-03-13 MED ORDER — TRAMADOL HCL 50 MG PO TABS
50.0000 mg | ORAL_TABLET | Freq: Once | ORAL | Status: AC
  Administered 2018-03-13: 50 mg via ORAL
  Filled 2018-03-13: qty 1

## 2018-03-13 MED ORDER — ACETAMINOPHEN 650 MG RE SUPP: 650.0000 mg | Freq: Four times a day (QID) | RECTAL | Status: DC | PRN

## 2018-03-13 MED ORDER — FERROUS SULFATE 325 (65 FE) MG PO TABS
325.0000 mg | ORAL_TABLET | Freq: Two times a day (BID) | ORAL | Status: DC
  Administered 2018-03-13 – 2018-03-23 (×20): 325 mg via ORAL
  Filled 2018-03-13 (×21): qty 1

## 2018-03-13 MED ORDER — DOCUSATE SODIUM 100 MG PO CAPS
100.0000 mg | ORAL_CAPSULE | Freq: Two times a day (BID) | ORAL | Status: DC
  Administered 2018-03-13 – 2018-03-23 (×20): 100 mg via ORAL
  Filled 2018-03-13 (×21): qty 1

## 2018-03-13 MED ORDER — FOLIC ACID 1 MG PO TABS
1.0000 mg | ORAL_TABLET | Freq: Every day | ORAL | Status: DC
  Administered 2018-03-13 – 2018-03-23 (×11): 1 mg via ORAL
  Filled 2018-03-13 (×11): qty 1

## 2018-03-13 MED ORDER — ACETAMINOPHEN 325 MG PO TABS
650.0000 mg | ORAL_TABLET | Freq: Four times a day (QID) | ORAL | Status: DC | PRN
  Administered 2018-03-17 – 2018-03-24 (×6): 650 mg via ORAL
  Filled 2018-03-13 (×6): qty 2

## 2018-03-13 MED ORDER — TRAMADOL HCL 50 MG PO TABS
100.0000 mg | ORAL_TABLET | Freq: Four times a day (QID) | ORAL | Status: DC | PRN
  Administered 2018-03-13 – 2018-03-23 (×17): 100 mg via ORAL
  Filled 2018-03-13 (×18): qty 2

## 2018-03-13 NOTE — H&P (Signed)
History and Physical    Tracy Flynn EXH:[id number] DOB: 05/14/1956 DOA: 03/22/2018  PCP: Tracy Blackbird, MD  Patient coming from: Home  I have personally briefly reviewed patient'Flynn old medical records in [place]  Chief Complaint: SOB  HPI: Tracy Flynn is a 62 y.o. female with medical history significant of metastatic ovarian cancer with recurrent malignant ascites and L sided malignant pleural effusion.  Not felt to be candidate for further oncologic treatment at this time.  Is being managed by recurrent thoracentesis.  Didn't end up getting plurex catheter last admit though one was recommended / offered.  Today presents to ED with recurrent SOB.  Looks like they did thoracentesis on 9/10 and paracentesis on 9/11   ED Course: CXR confirms very large L pleural effusion.  Mild sinus tachycardia, but no hypotension.   Review of Systems: As per HPI otherwise 10 point review of systems negative.   Past Medical History:  Diagnosis Date  . Pleural effusion   . Uterine fibroid     Past Surgical History:  Procedure Laterality Date  . HERNIA REPAIR    . IR PARACENTESIS  01/15/2018  . IR THORACENTESIS ASP PLEURAL SPACE W/IMG GUIDE  12/11/2017  . IR THORACENTESIS ASP PLEURAL SPACE W/IMG GUIDE  12/29/2017  . IR THORACENTESIS ASP PLEURAL SPACE W/IMG GUIDE  01/13/2018     reports that she has quit smoking. She has never used smokeless tobacco. She reports that she does not drink alcohol or use drugs.  Allergies  Allergen Reactions  . Codeine Rash  . Penicillins Rash    Has patient had a PCN reaction causing immediate rash, facial/tongue/throat swelling, SOB or lightheadedness with hypotension: Yes Has patient had a PCN reaction causing severe rash involving mucus membranes or skin necrosis:No Has patient had a PCN reaction that required hospitalization: No Has patient had a PCN reaction occurring within the last 10 years: No If all of the above answers are "NO", then may  proceed with Cephalosporin use.     Family History  Problem Relation Age of Onset  . Cancer Father      Prior to Admission medications   Medication Sig Start Date End Date Taking? Authorizing Provider  cetirizine (ZYRTEC) 10 MG tablet Take 1 tablet (10 mg total) by mouth daily. Patient taking differently: Take 10 mg by mouth daily as needed for allergies.  12/10/17  Yes Ena Dawley, Tracy S, PA-C  docusate sodium (COLACE) 100 MG capsule Take 1 capsule (100 mg total) by mouth 2 (two) times daily. 02/20/18  Yes Lavina Hamman, MD  ferrous sulfate 325 (65 FE) MG tablet Take 1 tablet (325 mg total) by mouth 2 (two) times daily with a meal. 02/25/18  Yes Fulp, Cammie, MD  folic acid (FOLVITE) 1 MG tablet Take 1 tablet (1 mg total) by mouth daily. 01/17/18  Yes Georgette Shell, MD  lactulose (CHRONULAC) 10 GM/15ML solution Take 45 mLs (30 g total) by mouth daily. 02/21/18  Yes Lavina Hamman, MD  potassium chloride (K-DUR) 10 MEQ tablet Take 2 tablets (20 mEq total) by mouth daily. MUST MAKE APPT FOR FURTHER REFILLS 03/10/18  Yes Fulp, Cammie, MD  torsemide (DEMADEX) 20 MG tablet Take 20 mg by mouth daily, can take 20 mg extra for weight gain of 3 lbs in 1 day. MUST MAKE APPT FOR FURTHER REFILLS 03/10/18  Yes Fulp, Cammie, MD  traMADol (ULTRAM) 50 MG tablet Take 100 mg by mouth every 6 (six) hours as needed for moderate pain.  Yes [provider]    Physical Exam: Vitals:   03/13/18 0100 03/13/18 0200 03/13/18 0300 03/13/18 0415  BP: (!) 137/105 130/90 120/72   Pulse: (!) 111 (!) 116 (!) 107 (!) 113  Resp: 19 (!) 26 (!) 51 (!) 8  Temp:      TempSrc:      SpO2: 100% 100% 100% 96%  Weight:      Height:        Constitutional: NAD, calm, comfortable Eyes: PERRL, lids and conjunctivae normal ENMT: Mucous membranes are moist. Posterior pharynx clear of any exudate or lesions.Normal dentition.  Neck: normal, supple, no masses, no thyromegaly Respiratory: absent on left Cardiovascular:  Tachycardic, regular Abdomen: no tenderness, no masses palpated. No hepatosplenomegaly. Bowel sounds positive.  Musculoskeletal: no clubbing / cyanosis. No joint deformity upper and lower extremities. Good ROM, no contractures. Normal muscle tone.  Skin: no rashes, lesions, ulcers. No induration Neurologic: CN 2-12 grossly intact. Sensation intact, DTR normal. Strength 5/5 in all 4.  Psychiatric: Normal judgment and insight. Alert and oriented x 3. Normal mood.    Labs on Admission: I have personally reviewed following labs and imaging studies  CBC: Recent Labs  Lab 03/08/2018 2336  WBC 18.5*  HGB 13.0  HCT 40.5  MCV 78.0  PLT 798*   Basic Metabolic Panel: Recent Labs  Lab 03/10/2018 2336  NA 135  K 5.2*  CL 100  CO2 23  GLUCOSE 103*  BUN 59*  CREATININE 2.00*  CALCIUM 8.7*   GFR: Estimated Creatinine Clearance: 30.5 mL/min (A) (by C-G formula based on SCr of 2 mg/dL (H)). Liver Function Tests: Recent Labs  Lab 03/13/18 0022  AST 45*  ALT 40  ALKPHOS 679*  BILITOT 0.5  PROT 7.2  ALBUMIN 1.8*   No results for input(Flynn): LIPASE, AMYLASE in the last 168 hours. No results for input(Flynn): AMMONIA in the last 168 hours. Coagulation Profile: No results for input(Flynn): INR, PROTIME in the last 168 hours. Cardiac Enzymes: No results for input(Flynn): CKTOTAL, CKMB, CKMBINDEX, TROPONINI in the last 168 hours. BNP (last 3 results) No results for input(Flynn): PROBNP in the last 8760 hours. HbA1C: No results for input(Flynn): HGBA1C in the last 72 hours. CBG: No results for input(Flynn): GLUCAP in the last 168 hours. Lipid Profile: No results for input(Flynn): CHOL, HDL, LDLCALC, TRIG, CHOLHDL, LDLDIRECT in the last 72 hours. Thyroid Function Tests: No results for input(Flynn): TSH, T4TOTAL, FREET4, T3FREE, THYROIDAB in the last 72 hours. Anemia Panel: No results for input(Flynn): VITAMINB12, FOLATE, FERRITIN, TIBC, IRON, RETICCTPCT in the last 72 hours. Urine analysis:    Component Value Date/Time     COLORURINE AMBER (A) 02/16/2018 0425   APPEARANCEUR HAZY (A) 02/16/2018 0425   LABSPEC 1.026 02/16/2018 0425   PHURINE 5.0 02/16/2018 0425   GLUCOSEU NEGATIVE 02/16/2018 0425   HGBUR NEGATIVE 02/16/2018 0425   BILIRUBINUR negative 02/25/2018 1216   KETONESUR negative 02/25/2018 1216   KETONESUR 5 (A) 02/16/2018 0425   PROTEINUR NEGATIVE 02/16/2018 0425   UROBILINOGEN 0.2 02/25/2018 1216   NITRITE Negative 02/25/2018 1216   NITRITE NEGATIVE 02/16/2018 0425   LEUKOCYTESUR Negative 02/25/2018 1216    Radiological Exams on Admission: Dg Chest 2 View  Result Date: 03/11/2018 CLINICAL DATA:  Former smoker with dyspnea beginning last week. EXAM: CHEST - 2 VIEW COMPARISON:  03/01/2018 and CXR 03/02/2018 FINDINGS: Unchanged large left pleural effusion spanning at least 6 vertebral body heights on the lateral view. This pleural effusion obscures much of the left  heart border. The aortic arch is nonaneurysmal. Right heart border is obscured by overlap with the thoracic spine. There is dextroconvex curvature of the thoracic spine as before. The right lung is clear though the apex is excluded on the frontal view. Metallic staples project over the superior mediastinum as before. IMPRESSION: Unchanged large left pleural effusion. Electronically Signed   By: Ashley Royalty M.D.   On: 02/26/2018 22:33    EKG: Independently reviewed.  Assessment/Plan Principal Problem:   Malignant pleural effusion: Left Active Problems:   Ovarian tumor   Recurrent pleural effusion on left   Cancer, metastatic (H[facility])   Ascites    1. Recurrent L malignant pleural effusion - 1. Thoracentesis in AM 2. Needs plurex catheter 3. Pal care consult regarding management of plurex catheter (currently has home health just once a week). 2. Recurrent malignant ascites - 1. IR eval, possibly paracentesis? 3. Metastatic ovarian cancer - 1. Pal care consult as above.  DVT prophylaxis: SCDs Code Status: DNR/DNI - confirmed  with patient Family Communication: Family at bedside Disposition Plan: Home after admit - either with home health vs home hospice (TBD) Consults called: Put pal care consult in chart to discuss with patient Admission status: Place in [place], [place] [facility] Pager [phone] Only works nights!  If 7AM-7PM, please contact the primary day team physician taking care of patient  [email or web address] Password [facility]  03/13/2018, 4:28 AM

## 2018-03-13 NOTE — ED Notes (Signed)
Attempted report to receiving RN. She will call back when she is able to get report.

## 2018-03-13 NOTE — Progress Notes (Signed)
Patient seen and examined at bedside, patient admitted after midnight, please see earlier detailed admission note by [name], DO. Briefly, patient presented secondary to shortness of breath secondary to malignant effusion. She is s/p paracentesis with 900 mL output. Consideration of Pleurx. Patient does not want to talk about plans with me today and is tearful. Will add home Tramadol. Plan for discharge back home likely tomorrow.   Tracy Poche, MD [facility] 03/13/2018, 10:28 AM

## 2018-03-13 NOTE — Procedures (Signed)
Left effusion  S/p LEFT THORACENTESIS  900 cc blood tinged pleural fld removed  No comp Stable ebl 0 cxr pending

## 2018-03-13 NOTE — Progress Notes (Signed)
ED TO INPATIENT HANDOFF REPORT  Name/Age/Gender Tracy Flynn 62 y.o. female  Code Status    Code Status Orders  (From admission, onward)         Start     Ordered   03/13/18 0419  Do not attempt resuscitation (DNR)  Continuous    Question Answer Comment  In the event of cardiac or respiratory ARREST Do not call a "code blue"   In the event of cardiac or respiratory ARREST Do not perform Intubation, CPR, defibrillation or ACLS   In the event of cardiac or respiratory ARREST Use medication by any route, position, wound care, and other measures to relive pain and suffering. May use oxygen, suction and manual treatment of airway obstruction as needed for comfort.      03/13/18 0426        Code Status History    Date Active Date Inactive Code Status Order ID Comments User Context   02/17/2018 1359 02/20/2018 2048 DNR [id number]  Pershing Proud, NP Inpatient   02/15/2018 2007 02/17/2018 1359 Full Code [id number]  Eugenie Filler, MD Inpatient   01/12/2018 2232 01/16/2018 1744 Full Code [id number]  Rise Patience, MD Inpatient   11/27/2017 1338 11/28/2017 2027 Full Code [id number]  Shelly Coss, MD ED      Home/SNF/Other Home  Chief Complaint Fluid on Lungs  Level of Care/Admitting Diagnosis ED Disposition    ED Disposition Condition [place]: [facility] [100102]  Level of Care: Med-Surg [16]  Diagnosis: Malignant pleural effusion [511.81.ICD-9-CM]  Admitting Physician: [name] [[id number]]  Attending Physician: [name] [[id number]]  PT Class (Do Not Modify): Observation [104]  PT Acc Code (Do Not Modify): Observation [10022]       Medical History Past Medical History:  Diagnosis Date  . Pleural effusion   . Uterine fibroid     Allergies Allergies  Allergen Reactions  . Codeine Rash  . Penicillins Rash    Has patient had a PCN reaction causing immediate rash, facial/tongue/throat swelling, SOB or  lightheadedness with hypotension: Yes Has patient had a PCN reaction causing severe rash involving mucus membranes or skin necrosis:No Has patient had a PCN reaction that required hospitalization: No Has patient had a PCN reaction occurring within the last 10 years: No If all of the above answers are "NO", then may proceed with Cephalosporin use.     IV Location/Drains/Wounds Patient Lines/Drains/Airways Status   Active Line/Drains/Airways    Name:   Placement date:   Placement time:   Site:   Days:   Peripheral IV 03/07/2018 Right Forearm   03/16/2018    2335    Forearm   1          Labs/Imaging Results for orders placed or performed during the hospital encounter of 03/02/2018 (from the past 48 hour(s))  Basic metabolic panel     Status: Abnormal   Collection Time: 03/01/2018 11:36 PM  Result Value Ref Range   Sodium 135 135 - 145 mmol/L   Potassium 5.2 (H) 3.5 - 5.1 mmol/L   Chloride 100 98 - 111 mmol/L   CO2 23 22 - 32 mmol/L   Glucose, Bld 103 (H) 70 - 99 mg/dL   BUN 59 (H) 8 - 23 mg/dL   Creatinine, Ser 2.00 (H) 0.44 - 1.00 mg/dL   Calcium 8.7 (L) 8.9 - 10.3 mg/dL   GFR calc non Af Amer 26 (L) >60 mL/min   GFR calc  Af Amer 30 (L) >60 mL/min    Comment: (NOTE) The eGFR has been calculated using the CKD EPI equation. This calculation has not been validated in all clinical situations. eGFR's persistently <60 mL/min signify possible Chronic Kidney Disease.    Anion gap 12 5 - 15    Comment: Performed at [facility], [place] [address] [place], [place] [address]  CBC     Status: Abnormal   Collection Time: 03/13/2018 11:36 PM  Result Value Ref Range   WBC 18.5 (H) 4.0 - 10.5 K/uL   RBC 5.19 (H) 3.87 - 5.11 MIL/uL   Hemoglobin 13.0 12.0 - 15.0 g/dL   HCT 40.5 36.0 - 46.0 %   MCV 78.0 78.0 - 100.0 fL   MCH 25.0 (L) 26.0 - 34.0 pg   MCHC 32.1 30.0 - 36.0 g/dL   RDW 18.2 (H) 11.5 - 15.5 %   Platelets 660 (H) 150 - 400 K/uL    Comment: Performed at [facility], [place] [address] [place], [place] [address]  POCT i-Stat troponin I     Status: None   Collection Time: 02/26/2018 11:42 PM  Result Value Ref Range   Troponin i, poc 0.00 0.00 - 0.08 ng/mL   Comment 3            Comment: Due to the release kinetics of cTnI, a negative result within the first hours of the onset of symptoms does not rule out myocardial infarction with certainty. If myocardial infarction is still suspected, repeat the test at appropriate intervals.   Hepatic function panel     Status: Abnormal   Collection Time: 03/13/18 12:22 AM  Result Value Ref Range   Total Protein 7.2 6.5 - 8.1 g/dL   Albumin 1.8 (L) 3.5 - 5.0 g/dL   AST 45 (H) 15 - 41 U/L   ALT 40 0 - 44 U/L   Alkaline Phosphatase 679 (H) 38 - 126 U/L   Total Bilirubin 0.5 0.3 - 1.2 mg/dL   Bilirubin, Direct <0.1 0.0 - 0.2 mg/dL   Indirect Bilirubin NOT CALCULATED 0.3 - 0.9 mg/dL    Comment: Performed at [facility], [place] [address] [place], [place] [address]   Dg Chest 2 View  Result Date: 03/22/2018 CLINICAL DATA:  Former smoker with dyspnea beginning last week. EXAM: CHEST - 2 VIEW COMPARISON:  03/01/2018 and CXR 03/02/2018 FINDINGS: Unchanged large left pleural effusion spanning at least 6 vertebral body heights on the lateral view. This pleural effusion obscures much of the left heart border. The aortic arch is nonaneurysmal. Right heart border is obscured by overlap with the thoracic spine. There is dextroconvex curvature of the thoracic spine as before. The right lung is clear though the apex is excluded on the frontal view. Metallic staples project over the superior mediastinum as before. IMPRESSION: Unchanged large left pleural effusion. Electronically Signed   By: Ashley Royalty M.D.   On: 03/17/2018 22:33    Pending Labs Unresulted Labs (From admission, onward)   None      Vitals/Pain Today's Vitals   03/13/18 0200 03/13/18 0206 03/13/18 0300 03/13/18 0415  BP:  130/90  120/72   Pulse: (!) 116  (!) 107 (!) 113  Resp: (!) 26  (!) 51 (!) 8  Temp:      TempSrc:      SpO2: 100%  100% 96%  Weight:      Height:      PainSc:  10-Worst pain  ever      Isolation Precautions No active isolations  Medications Medications  torsemide (DEMADEX) tablet 20 mg (has no administration in time range)  lactulose (CHRONULAC) 10 GM/15ML solution 30 g (has no administration in time range)  folic acid (FOLVITE) tablet 1 mg (has no administration in time range)  ferrous sulfate tablet 325 mg (has no administration in time range)  docusate sodium (COLACE) capsule 100 mg (has no administration in time range)  acetaminophen (TYLENOL) tablet 650 mg (has no administration in time range)    Or  acetaminophen (TYLENOL) suppository 650 mg (has no administration in time range)  ondansetron (ZOFRAN) tablet 4 mg (has no administration in time range)    Or  ondansetron (ZOFRAN) injection 4 mg (has no administration in time range)  traMADol (ULTRAM) tablet 50 mg (50 mg Oral Given 03/13/18 0206)    Mobility walks

## 2018-03-14 ENCOUNTER — Observation Stay (HOSPITAL_COMMUNITY): Payer: Self-pay

## 2018-03-14 MED ORDER — LIDOCAINE HCL 1 % IJ SOLN
INTRAMUSCULAR | Status: AC
  Filled 2018-03-14: qty 10

## 2018-03-14 NOTE — Progress Notes (Signed)
PROGRESS NOTE    Remi Rester  [facility]:[id number] DOB: 1956-03-26 DOA: 02/21/2018 PCP: Antony Blackbird, MD   Brief Narrative: Tracy Flynn is a 62 y.o. female with medical history significant of metastatic ovarian cancer with recurrent malignant ascites and L sided malignant pleural effusion. She presented secondary to shortness of breath with recurrent pleural effusion. S/p thoracentesis and paracentesis.   Assessment & Plan:   Principal Problem:   Malignant pleural effusion: Left Active Problems:   Ovarian tumor   Recurrent pleural effusion on left   Cancer, metastatic (HCC)   Ascites   Recurrent malignant pleural effusion/ascites Secondary to metastatic ovarian cancer. S/p thoracentesis and paracentesis. Will need pleural catheter as an outpatient. -Palliative care recommendations pending -PT eval  Metastatic ovarian cancer Patient was DNR on admission but has requested change to Full Code. She states that she "has to fight for her granddaughter." risks and benefits explained. -Palliative care recommendations   DVT prophylaxis: SCDs Code Status:   Code Status: Full Code Family Communication: None at bedside Disposition Plan: Discharge likely in 24 hours   Consultants:   Interventional radiology  Palliative care  Procedures:   Thoracentesis  Paracentesis  Antimicrobials:  None    Subjective: Feeling extremely tired after paracentesis.  Objective: Vitals:   03/14/18 1452 03/14/18 1508 03/14/18 1529 03/14/18 1544  BP: (!) 116/91 (!) 133/99 (!) 115/92 120/82  Pulse: (!) 102 100 (!) 101 (!) 102  Resp: 18 18 18 18   Temp:      TempSrc:      SpO2: 99% 99% 97% 98%  Weight:      Height:        Intake/Output Summary (Last 24 hours) at 03/14/2018 1626 Last data filed at 03/13/2018 1846 Gross per 24 hour  Intake 360 ml  Output -  Net 360 ml   Filed Weights   03/06/2018 2107  Weight: 66.2 kg    Examination:  General exam: Appears calm and comfortable.  Cachectic appearing Respiratory system: Diminished. Respiratory effort normal. Cardiovascular system: S1 & S2 heard, RRR. No murmurs, rubs, gallops or clicks. Gastrointestinal system: Abdomen is distended, soft and nontender. No organomegaly or masses felt. Normal bowel sounds heard. Central nervous system: Alert and oriented. No focal neurological deficits. Extremities: No edema. No calf tenderness Skin: No cyanosis. No rashes Psychiatry: Judgement and insight appear normal. Mood & affect appropriate.     Data Reviewed: I have personally reviewed following labs and imaging studies  CBC: Recent Labs  Lab 03/18/2018 2336  WBC 18.5*  HGB 13.0  HCT 40.5  MCV 78.0  PLT 662*   Basic Metabolic Panel: Recent Labs  Lab 02/21/2018 2336  NA 135  K 5.2*  CL 100  CO2 23  GLUCOSE 103*  BUN 59*  CREATININE 2.00*  CALCIUM 8.7*   GFR: Estimated Creatinine Clearance: 30.5 mL/min (A) (by C-G formula based on SCr of 2 mg/dL (H)). Liver Function Tests: Recent Labs  Lab 03/13/18 0022  AST 45*  ALT 40  ALKPHOS 679*  BILITOT 0.5  PROT 7.2  ALBUMIN 1.8*   No results for input(s): LIPASE, AMYLASE in the last 168 hours. No results for input(s): AMMONIA in the last 168 hours. Coagulation Profile: No results for input(s): INR, PROTIME in the last 168 hours. Cardiac Enzymes: No results for input(s): CKTOTAL, CKMB, CKMBINDEX, TROPONINI in the last 168 hours. BNP (last 3 results) No results for input(s): PROBNP in the last 8760 hours. HbA1C: No results for input(s): HGBA1C in the last 72 hours.  CBG: No results for input(s): GLUCAP in the last 168 hours. Lipid Profile: No results for input(s): CHOL, HDL, LDLCALC, TRIG, CHOLHDL, LDLDIRECT in the last 72 hours. Thyroid Function Tests: No results for input(s): TSH, T4TOTAL, FREET4, T3FREE, THYROIDAB in the last 72 hours. Anemia Panel: No results for input(s): VITAMINB12, FOLATE, FERRITIN, TIBC, IRON, RETICCTPCT in the last 72  hours. Sepsis Labs: No results for input(s): PROCALCITON, LATICACIDVEN in the last 168 hours.  No results found for this or any previous visit (from the past 240 hour(s)).       Radiology Studies: Dg Chest 1 View  Result Date: 03/13/2018 CLINICAL DATA:  Status post left thoracentesis. EXAM: CHEST  1 VIEW COMPARISON:  03/22/2018 FINDINGS: There is decreased fluid on the left following thoracentesis. There is still significant opacity extending from the left mid to left lower hemithorax obscuring the left heart border and hemidiaphragm. This is consistent with a combination of lung consolidation and/or atelectasis and residual fluid. No pneumothorax. Right lung remains clear. IMPRESSION: 1. Decreased left pleural fluid following thoracentesis. No pneumothorax. Electronically Signed   By: Lajean Manes M.D.   On: 03/13/2018 10:27   Dg Chest 2 View  Result Date: 03/10/2018 CLINICAL DATA:  Former smoker with dyspnea beginning last week. EXAM: CHEST - 2 VIEW COMPARISON:  03/01/2018 and CXR 03/02/2018 FINDINGS: Unchanged large left pleural effusion spanning at least 6 vertebral body heights on the lateral view. This pleural effusion obscures much of the left heart border. The aortic arch is nonaneurysmal. Right heart border is obscured by overlap with the thoracic spine. There is dextroconvex curvature of the thoracic spine as before. The right lung is clear though the apex is excluded on the frontal view. Metallic staples project over the superior mediastinum as before. IMPRESSION: Unchanged large left pleural effusion. Electronically Signed   By: Ashley Royalty M.D.   On: 03/17/2018 22:33   Dg Abd 1 View  Result Date: 03/13/2018 CLINICAL DATA:  Nausea and vomiting.  Ascites. EXAM: ABDOMEN - 1 VIEW COMPARISON:  No comparison studies available. FINDINGS: No gaseous bowel dilatation to suggest obstruction. Diffuse hazy soft tissue attenuation over the abdomen is compatible with ascites and there is some  centralization of bowel loops. Sclerotic lesion right ninth rib likely a bone island. Lumbar scoliosis IMPRESSION: Diffuse hazy opacity over the abdomen with some centralization of bowel loops. Imaging features are compatible with reported history of ascites Electronically Signed   By: Misty Stanley M.D.   On: 03/13/2018 14:46   US Thoracentesis Asp Pleural Space W/img Guide  Result Date: 03/13/2018 INDICATION: Shortness of breath, recurrent left malignant effusion EXAM: ULTRASOUND GUIDED LEFT THORACENTESIS MEDICATIONS: 1% lidocaine local COMPLICATIONS: None immediate. PROCEDURE: An ultrasound guided thoracentesis was thoroughly discussed with the patient and questions answered. The benefits, risks, alternatives and complications were also discussed. The patient understands and wishes to proceed with the procedure. Written consent was obtained. Ultrasound was performed to localize and mark an adequate pocket of fluid in the left chest. The area was then prepped and draped in the normal sterile fashion. 1% Lidocaine was used for local anesthesia. Under ultrasound guidance a 6 Fr Safe-T-Centesis catheter was introduced. Thoracentesis was performed. The catheter was removed and a dressing applied. FINDINGS: A total of approximately 900 cc of blood-tinged pleural fluid was removed. Sample was not sent for laboratory analysis IMPRESSION: Successful ultrasound guided left thoracentesis yielding 900 cc of pleural fluid. Electronically Signed   By: Jerilynn Mages.  Shick M.D.   On: 03/13/2018  10:18        Scheduled Meds: . docusate sodium  100 mg Oral BID  . ferrous sulfate  325 mg Oral BID WC  . folic acid  1 mg Oral Daily  . lactulose  30 g Oral Daily  . lidocaine      . torsemide  20 mg Oral Daily   Continuous Infusions:   LOS: 0 days     Cordelia Poche, MD [facility] 03/14/2018, 4:26 PM Pager: [phone]  If 7PM-7AM, please contact night-coverage [email or web address] 03/14/2018, 4:26 PM

## 2018-03-14 NOTE — Procedures (Signed)
Ultrasound-guided therapeutic paracentesis performed yielding 6.8 liters of chylous/milky  fluid. No immediate complications. Pt does wish to proceed with placement of tunneled pleural drain for recurrent left effusion. She is s/p left thoracentesis yesterday yielding 900 cc. In order to perform cath placement there needs to be sufficient pleural fluid at time of procedure. With recent thora she will need to allow time for reaccumulation before cath placed. The procedure can be done as OP if needed and will need order placed by physician. Call [phone] or [phone] to assist with scheduling.

## 2018-03-15 DIAGNOSIS — Z7189 Other specified counseling: Secondary | ICD-10-CM

## 2018-03-15 DIAGNOSIS — Z515 Encounter for palliative care: Secondary | ICD-10-CM

## 2018-03-15 LAB — BASIC METABOLIC PANEL
Anion gap: 10 (ref 5–15)
BUN: 68 mg/dL — ABNORMAL HIGH (ref 8–23)
CHLORIDE: 100 mmol/L (ref 98–111)
CO2: 27 mmol/L (ref 22–32)
CREATININE: 2.25 mg/dL — AB (ref 0.44–1.00)
Calcium: 8.7 mg/dL — ABNORMAL LOW (ref 8.9–10.3)
GFR calc Af Amer: 26 mL/min — ABNORMAL LOW (ref >60)
GFR calc non Af Amer: 22 mL/min — ABNORMAL LOW (ref >60)
GLUCOSE: 102 mg/dL — AB (ref 70–99)
POTASSIUM: 4.9 mmol/L (ref 3.5–5.1)
Sodium: 137 mmol/L (ref 135–145)

## 2018-03-15 LAB — CBC
HEMATOCRIT: 39 % (ref 36.0–46.0)
Hemoglobin: 11.9 g/dL — ABNORMAL LOW (ref 12.0–15.0)
MCH: 24.2 pg — AB (ref 26.0–34.0)
MCHC: 30.5 g/dL (ref 30.0–36.0)
MCV: 79.3 fL (ref 78.0–100.0)
PLATELETS: 644 10*3/uL — AB (ref 150–400)
RBC: 4.92 MIL/uL (ref 3.87–5.11)
RDW: 18.1 % — AB (ref 11.5–15.5)
WBC: 20.1 10*3/uL — ABNORMAL HIGH (ref 4.0–10.5)

## 2018-03-15 MED ORDER — SODIUM CHLORIDE 0.9 % IV SOLN
INTRAVENOUS | Status: DC
  Administered 2018-03-15 – 2018-03-21 (×10): via INTRAVENOUS

## 2018-03-15 NOTE — Consult Note (Signed)
Consultation Note Date: 03/15/2018   Patient Name: Tracy Flynn  DOB: 1956-05-03  MRN: [id number]  Age / Sex: 62 y.o., female  PCP: Antony Blackbird, MD Referring Physician: Mariel Aloe, MD  Reason for Consultation: Establishing goals of care  HPI/Patient Profile: 62 y.o. 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   Clinical Assessment and Goals of Care: I met today at Tracy Flynn's bedside - she is known to me from past admissions and consults. Tracy Flynn is deep asleep but her son is at bedside. I met him last admission. I ask him how everything has been going since we last met and he says "okay." Seems she is getting weaker and appetite has been poor especially the past few days. Previous admissions she received much relief from paracentesis and thoracentesis and was walking, talking, and eating well after. Today she appears much weaker and fatigued.   I asked about his daughter (Tracy Flynn's 37 yo granddaughter - her pride and joy) visiting and he shares that she has come and has decided to stay here permanently. I expressed that I know this makes them both very happy.   I also inquired as to the change in her goals of care. He confirms that she has changed her mind and now wants to be rescusitated. I gently reinforced that this will not help her cancer and I would hate to see her go through resuscitation and would hate him and family to see that. However, we will respect her wishes and help to support her the best we are able.   Alexiana only awoke briefly when son told her that her was leaving to pick up his daughter from school. I told her hello and that I  would come back tomorrow to check on her (she was clearly extremely fatigued and not up to conversation).   Emotional support provided. Appears that Tracy Flynn has evolved from telling me previously that she is aware that she is dying and is accepting of whatever the future held for her. She was very clear last visit on her wishes for DNR. I believe that now that she has her granddaughter here this is impacting her previous stated goals and acceptance of EOL to wanting more time with her family. Unfortunately she has missed opportunity for treatment of her cancer and prognosis is grim. Appears they are struggling more with prognosis than when I previously met with them.   Primary Decision Maker PATIENT    SUMMARY OF RECOMMENDATIONS   - Plan to speak further with Tracy Flynn tomorrow  Code Status/Advance Care Planning:  Full code   Symptom Management:   Would benefit from PleurX drain and peritoneal drain BUT will need to be accepting of assistance to help manage. Per son pursuing SNF rehab.   Palliative Prophylaxis:   Aspiration, Bowel Regimen,  Delirium Protocol, Frequent Pain Assessment and Turn Reposition  Additional Recommendations (Limitations, Scope, Preferences):  Full Scope Treatment  Psycho-social/Spiritual:   Desire for further Chaplaincy support:no  Additional Recommendations: Caregiving  Support/Resources, Education on Hospice and Grief/Bereavement Support  Prognosis:   < 3 months  Discharge Planning: [facility] with Palliative care service follow-up      Primary Diagnoses: Present on Admission: . Cancer, metastatic ([place]) . Malignant pleural effusion: Left . Ovarian tumor . Recurrent pleural effusion on left . Ascites   I have reviewed the medical record, interviewed the patient and family, and examined the patient. The following aspects are pertinent.  Past Medical History:  Diagnosis Date  . Pleural effusion   . Uterine fibroid     Social History   Socioeconomic History  . Marital status: Single    Spouse name: Not on file  . Number of children: Not on file  . Years of education: Not on file  . Highest education level: Not on file  Occupational History  . Not on file  Social Needs  . Financial resource strain: Not on file  . Food insecurity:    Worry: Not on file    Inability: Not on file  . Transportation needs:    Medical: Not on file    Non-medical: Not on file  Tobacco Use  . Smoking status: Former Research scientist (life sciences)  . Smokeless tobacco: Never Used  Substance and Sexual Activity  . Alcohol use: Never    Frequency: Never  . Drug use: Never  . Sexual activity: Not Currently  Lifestyle  . Physical activity:    Days per week: Not on file    Minutes per session: Not on file  . Stress: Not on file  Relationships  . Social connections:    Talks on phone: Not on file    Gets together: Not on file    Attends religious service: Not on file    Active member of club or organization: Not on file    Attends meetings of clubs or organizations: Not on file    Relationship status: Not on file  Other Topics Concern  . Not on file  Social History Narrative  . Not on file   Family History  Problem Relation Age of Onset  . Cancer Father    Scheduled Meds: . docusate sodium  100 mg Oral BID  . ferrous sulfate  325 mg Oral BID WC  . folic acid  1 mg Oral Daily  . lactulose  30 g Oral Daily  . torsemide  20 mg Oral Daily   Continuous Infusions: . sodium chloride     PRN Meds:.acetaminophen **OR** acetaminophen, ondansetron **OR** ondansetron (ZOFRAN) IV, traMADol Allergies  Allergen Reactions  . Codeine Rash  . Penicillins Rash    Has patient had a PCN reaction causing immediate rash, facial/tongue/throat swelling, SOB or lightheadedness with hypotension: Yes Has patient had a PCN reaction causing severe rash involving mucus membranes or skin necrosis:No Has patient had a PCN reaction that required  hospitalization: No Has patient had a PCN reaction occurring within the last 10 years: No If all of the above answers are "NO", then may proceed with Cephalosporin use.    Review of Systems  Unable to perform ROS: Acuity of condition    Physical Exam  Constitutional: She appears well-developed. She appears cachectic. She has a sickly appearance. She appears ill.  HENT:  Head: Normocephalic and atraumatic.  Cardiovascular: Normal rate and regular rhythm.  Pulmonary/Chest: Effort normal. No accessory muscle usage. No tachypnea. No respiratory distress.  Abdominal: She exhibits distension.  Neurological:  Sleeping   Nursing note and vitals reviewed.   Vital Signs: BP 118/77 (BP Location: Left Arm)   Pulse 98   Temp 98 F (36.7 C)   Resp 18   Ht _0  (1.803 m)   Wt 66.2 kg   SpO2 96%   BMI 20.36 kg/m  Pain Scale: 0-10   Pain Score: 0-No pain   SpO2: SpO2: 96 % O2 Device:SpO2: 96 % O2 Flow Rate: .   IO: Intake/output summary:   Intake/Output Summary (Last 24 hours) at 03/15/2018 1345 Last data filed at 03/14/2018 2200 Gross per 24 hour  Intake -  Output 600 ml  Net -600 ml    LBM: Last BM Date: 03/14/18 Baseline Weight: Weight: 66.2 kg Most recent weight: Weight: 66.2 kg     Palliative Assessment/Data: 30%     Time In: 1300 Time Out: 1345 Time Total: 45 min Greater than 50%  of this time was spent counseling and coordinating care related to the above assessment and plan.  Signed by: Vinie Sill, NP Palliative Medicine Team Pager # [phone] (M-F 8a-5p) Team Phone # [phone] (Nights/Weekends)

## 2018-03-15 NOTE — Evaluation (Signed)
Physical Therapy Evaluation Patient Details Name: Tracy Flynn MRN: [id number] DOB: 1955-09-16 Today's Date: 03/15/2018   History of Present Illness  62 yo female admitted with malignant pleural effusion (recurrent)-s/p paracentesis. Hx of met ovarian cancer, recurrent ascites    Clinical Impression  On eval, pt required Min assist for mobility. She walked ~50 feet x 2 with use of hallway handrail and therapist's hand for support. Pt is weak and fatigues easily. She is at risk for falls when ambulating. Encouraged RW use for safe ambulation. Discussed d/c plan-pt is undecided. She lives alone and only has intermittent supervision/assist. Unsure if ST SNF is an option for pt-recommend CSW consult. Will follow and progress activity as tolerated. If pt returns home, recommend HHPT, [place], home health aide.     Follow Up Recommendations SNF vs Home health PT/Home Health OT/Home Health Aide;Supervision/Assistance - 24 hour (depending on pt/family decision)    Equipment Recommendations  None recommended by PT    Recommendations for Other Services       Precautions / Restrictions Precautions Precautions: Fall Restrictions Weight Bearing Restrictions: No      Mobility  Bed Mobility Overal bed mobility: Needs Assistance Bed Mobility: Supine to Sit;Sit to Supine     Supine to sit: Min assist;HOB elevated Sit to supine: Min assist;HOB elevated   General bed mobility comments: Assist for trunk and LEs. Increased time. Pt relied on bedrail as well.   Transfers Overall transfer level: Needs assistance Equipment used: Rolling walker (2 wheeled) Transfers: Sit to/from Stand Sit to Stand: Min guard;From elevated surface         General transfer comment: Close guard for safety.   Ambulation/Gait Ambulation/Gait assistance: Min assist Gait Distance (Feet): 50 Feet(x2) Assistive device: 1 person hand held assist(hallway handrail) Gait Pattern/deviations: Step-through  pattern;Decreased stride length;Staggering right;Staggering left;Drifts right/left     General Gait Details: Unsteady, requiring at least 1 point of support. Slow gait speed. Pt fatigues easily. Seated rest break needed/taken after 50 feet. O2 sat 99% on RA.   Stairs            Wheelchair Mobility    Modified Rankin (Stroke Patients Only)       Balance Overall balance assessment: Needs assistance           Standing balance-Leahy Scale: Fair Standing balance comment: Unsteady when ambulating-at risk for falls                             Pertinent Vitals/Pain Pain Assessment: Faces Faces Pain Scale: Hurts little more Pain Location: abdomen Pain Intervention(s): Limited activity within patient's tolerance;Repositioned    Home Living Family/patient expects to be discharged to:: Private residence Living Arrangements: Alone Available Help at Discharge: Family;Friend(s);Available PRN/intermittently Type of Home: House Home Access: Stairs to enter   Entrance Stairs-Number of Steps: 5 Home Layout: Multi-level Home Equipment: Walker - 2 wheels;Bedside commode      Prior Function Level of Independence: Independent               Hand Dominance        Extremity/Trunk Assessment   Upper Extremity Assessment Upper Extremity Assessment: Generalized weakness    Lower Extremity Assessment Lower Extremity Assessment: Generalized weakness    Cervical / Trunk Assessment Cervical / Trunk Assessment: Normal  Communication   Communication: No difficulties  Cognition Arousal/Alertness: Awake/alert Behavior During Therapy: WFL for tasks assessed/performed Overall Cognitive Status: Within Functional Limits for tasks assessed  General Comments      Exercises     Assessment/Plan    PT Assessment Patient needs continued PT services  PT Problem List Decreased strength;Decreased  balance;Decreased mobility;Pain;Decreased knowledge of use of DME;Decreased activity tolerance       PT Treatment Interventions DME instruction;Gait training;Functional mobility training;Therapeutic activities;Balance training;Patient/family education;Therapeutic exercise    PT Goals (Current goals can be found in the Care Plan section)  Acute Rehab PT Goals Patient Stated Goal: feel better PT Goal Formulation: With patient/family Time For Goal Achievement: 03/29/18 Potential to Achieve Goals: Fair    Frequency Min 3X/week   Barriers to discharge        Co-evaluation               AM-PAC PT "6 Clicks" Daily Activity  Outcome Measure Difficulty turning over in bed (including adjusting bedclothes, sheets and blankets)?: A Lot Difficulty moving from lying on back to sitting on the side of the bed? : Unable Difficulty sitting down on and standing up from a chair with arms (e.g., wheelchair, bedside commode, etc,.)?: Unable Help needed moving to and from a bed to chair (including a wheelchair)?: A Little Help needed walking in hospital room?: A Little Help needed climbing 3-5 steps with a railing? : A Lot 6 Click Score: 12    End of Session Equipment Utilized During Treatment: Gait belt Activity Tolerance: Patient limited by fatigue Patient left: in bed;with call bell/phone within reach;with family/visitor present   PT Visit Diagnosis: Muscle weakness (generalized) (M62.81);Difficulty in walking, not elsewhere classified (R26.2);Unsteadiness on feet (R26.81)    Time: 6384-6659 PT Time Calculation (min) (ACUTE ONLY): 20 min   Charges:   PT Evaluation $PT Eval Moderate Complexity: [place], PT Acute Rehabilitation Services Pager: [phone] Office: [phone]

## 2018-03-15 NOTE — Progress Notes (Signed)
PROGRESS NOTE    Tracy Flynn  LTJ:[id number] DOB: 1956/05/13 DOA: 03/11/2018 PCP: Antony Blackbird, MD   Brief Narrative: Tracy Flynn is a 62 y.o. female with medical history significant of metastatic ovarian cancer with recurrent malignant ascites and L sided malignant pleural effusion. She presented secondary to shortness of breath with recurrent pleural effusion. S/p thoracentesis and paracentesis.   Assessment & Plan:   Principal Problem:   Malignant pleural effusion: Left Active Problems:   Ovarian tumor   Recurrent pleural effusion on left   Cancer, metastatic (HCC)   Ascites   Recurrent malignant pleural effusion/ascites Secondary to metastatic ovarian cancer. S/p thoracentesis and paracentesis. Will need pleural catheter as an outpatient. -Palliative care recommendations pending -PT eval: Recommending SNF  Metastatic ovarian cancer Patient was DNR on admission but has requested change to Full Code. She states that she "has to fight for her granddaughter." risks and benefits explained. -Palliative care recommendations  Acute kidney injury Initially thought secondary to hypervolemia. With fluid removed, still up. -IV NS -Recheck BMP in AM  Hyperkalemia Resolved.  Leukocytosis No source. Asymptomatic. Afebrile.   DVT prophylaxis: SCDs Code Status:   Code Status: Full Code Family Communication: Son and sister at bedside Disposition Plan: Discharge to SNF    Consultants:   Interventional radiology  Palliative care  Procedures:   Thoracentesis  Paracentesis  Antimicrobials:  None    Subjective: Tired today. No dyspnea.  Objective: Vitals:   03/14/18 1544 03/14/18 2050 03/15/18 0454 03/15/18 0755  BP: 120/82 131/80 118/77   Pulse: (!) 102 94 98   Resp: 18 20 18    Temp:  (!) 97.5 F (36.4 C) 98 F (36.7 C)   TempSrc:  Oral    SpO2: 98% 97% 100% 96%  Weight:      Height:        Intake/Output Summary (Last 24 hours) at 03/15/2018  1318 Last data filed at 03/14/2018 2200 Gross per 24 hour  Intake -  Output 600 ml  Net -600 ml   Filed Weights   02/26/2018 2107  Weight: 66.2 kg    Examination:  General exam: Appears calm and comfortable. Cachectic Respiratory system: Clear to auscultation. Respiratory effort normal. Cardiovascular system: S1 & S2 heard, RRR. No murmurs, rubs, gallops or clicks. Gastrointestinal system: Abdomen is distended, soft and nontender. Normal bowel sounds heard. Central nervous system: Alert and oriented. No focal neurological deficits. Extremities: No calf tenderness Skin: No cyanosis. No rashes Psychiatry: Judgement and insight appear normal. Depressed mood, flat affect    Data Reviewed: I have personally reviewed following labs and imaging studies  CBC: Recent Labs  Lab 03/18/2018 2336 03/15/18 1225  WBC 18.5* 20.1*  HGB 13.0 11.9*  HCT 40.5 39.0  MCV 78.0 79.3  PLT 660* 076*   Basic Metabolic Panel: Recent Labs  Lab 03/11/2018 2336 03/15/18 1225  NA 135 137  K 5.2* 4.9  CL 100 100  CO2 23 27  GLUCOSE 103* 102*  BUN 59* 68*  CREATININE 2.00* 2.25*  CALCIUM 8.7* 8.7*   GFR: Estimated Creatinine Clearance: 27.1 mL/min (A) (by C-G formula based on SCr of 2.25 mg/dL (H)). Liver Function Tests: Recent Labs  Lab 03/13/18 0022  AST 45*  ALT 40  ALKPHOS 679*  BILITOT 0.5  PROT 7.2  ALBUMIN 1.8*   No results for input(s): LIPASE, AMYLASE in the last 168 hours. No results for input(s): AMMONIA in the last 168 hours. Coagulation Profile: No results for input(s): INR, PROTIME in the  last 168 hours. Cardiac Enzymes: No results for input(s): CKTOTAL, CKMB, CKMBINDEX, TROPONINI in the last 168 hours. BNP (last 3 results) No results for input(s): PROBNP in the last 8760 hours. HbA1C: No results for input(s): HGBA1C in the last 72 hours. CBG: No results for input(s): GLUCAP in the last 168 hours. Lipid Profile: No results for input(s): CHOL, HDL, LDLCALC, TRIG,  CHOLHDL, LDLDIRECT in the last 72 hours. Thyroid Function Tests: No results for input(s): TSH, T4TOTAL, FREET4, T3FREE, THYROIDAB in the last 72 hours. Anemia Panel: No results for input(s): VITAMINB12, FOLATE, FERRITIN, TIBC, IRON, RETICCTPCT in the last 72 hours. Sepsis Labs: No results for input(s): PROCALCITON, LATICACIDVEN in the last 168 hours.  No results found for this or any previous visit (from the past 240 hour(s)).       Radiology Studies: Dg Abd 1 View  Result Date: 03/13/2018 CLINICAL DATA:  Nausea and vomiting.  Ascites. EXAM: ABDOMEN - 1 VIEW COMPARISON:  No comparison studies available. FINDINGS: No gaseous bowel dilatation to suggest obstruction. Diffuse hazy soft tissue attenuation over the abdomen is compatible with ascites and there is some centralization of bowel loops. Sclerotic lesion right ninth rib likely a bone island. Lumbar scoliosis IMPRESSION: Diffuse hazy opacity over the abdomen with some centralization of bowel loops. Imaging features are compatible with reported history of ascites Electronically Signed   By: Misty Stanley M.D.   On: 03/13/2018 14:46        Scheduled Meds: . docusate sodium  100 mg Oral BID  . ferrous sulfate  325 mg Oral BID WC  . folic acid  1 mg Oral Daily  . lactulose  30 g Oral Daily  . torsemide  20 mg Oral Daily   Continuous Infusions:   LOS: 0 days     Cordelia Poche, MD [facility] 03/15/2018, 1:18 PM  If 7PM-7AM, please contact night-coverage [email or web address] 03/15/2018, 1:18 PM

## 2018-03-16 LAB — CBC
HCT: 37.1 % (ref 36.0–46.0)
Hemoglobin: 11.3 g/dL — ABNORMAL LOW (ref 12.0–15.0)
MCH: 23.9 pg — ABNORMAL LOW (ref 26.0–34.0)
MCHC: 30.5 g/dL (ref 30.0–36.0)
MCV: 78.4 fL (ref 78.0–100.0)
PLATELETS: 532 10*3/uL — AB (ref 150–400)
RBC: 4.73 MIL/uL (ref 3.87–5.11)
RDW: 17.9 % — AB (ref 11.5–15.5)
WBC: 21.1 10*3/uL — ABNORMAL HIGH (ref 4.0–10.5)

## 2018-03-16 LAB — BASIC METABOLIC PANEL
Anion gap: 10 (ref 5–15)
BUN: 61 mg/dL — AB (ref 8–23)
CALCIUM: 8.2 mg/dL — AB (ref 8.9–10.3)
CHLORIDE: 101 mmol/L (ref 98–111)
CO2: 23 mmol/L (ref 22–32)
CREATININE: 1.94 mg/dL — AB (ref 0.44–1.00)
GFR calc non Af Amer: 27 mL/min — ABNORMAL LOW (ref >60)
GFR, EST AFRICAN AMERICAN: 31 mL/min — AB (ref >60)
Glucose, Bld: 81 mg/dL (ref 70–99)
Potassium: 5.6 mmol/L — ABNORMAL HIGH (ref 3.5–5.1)
SODIUM: 134 mmol/L — AB (ref 135–145)

## 2018-03-16 LAB — POTASSIUM: POTASSIUM: 5.2 mmol/L — AB (ref 3.5–5.1)

## 2018-03-16 MED ORDER — NYSTATIN 100000 UNIT/ML MT SUSP
5.0000 mL | Freq: Four times a day (QID) | OROMUCOSAL | Status: DC
  Administered 2018-03-16 – 2018-03-23 (×28): 500000 [IU] via ORAL
  Filled 2018-03-16 (×30): qty 5

## 2018-03-16 NOTE — Clinical Social Work Note (Signed)
Clinical Social Work Assessment  Patient Details  Name: Tracy Flynn MRN: [id number] Date of Birth: April 27, 1956  Date of referral:  03/16/18               Reason for consult:  Facility Placement                Permission sought to share information with:  Facility Sport and exercise psychologist, Family Supports Permission granted to share information::  Yes, Verbal Permission Granted  Name::     Shamica Moree  Agency::     Relationship::  son  Contact Information:  [phone]  Housing/Transportation Living arrangements for the past 2 months:  Single Family Home Source of Information:  Patient Patient Interpreter Needed:  None Criminal Activity/Legal Involvement Pertinent to Current Situation/Hospitalization:  No - Comment as needed Significant Relationships:  Adult Children Lives with:  Self Do you feel safe going back to the place where you live?  (PT recommending SNF) Need for family participation in patient care:  No (Coment)  Care giving concerns:  Patient from home alone. Patient reported that she is usually independent with ambulation and ADLs at home but sometimes needs assistance getting up or transferring from furniture. PT recommending "SNF vs Home health PT/Home Health OT/Home Health Aide;Supervision/Assistance - 24 hour (depending on pt/family decision)".   Social Worker assessment / plan:  CSW spoke with patient at bedside regarding discharge planning and PT recommendation. CSW explained SNF placement process and medicaid pending as a payor source, patient verbalized understanding. Patient reported that she is agreeable to SNF for ST rehab. 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 CSW explained that patient would need to stay at SNF  for at least 30 days for medicaid to cover, patient agreeable. CSW agreed to complete patient's FL2 and follow up with bed offers. Patient granted CSW verbal permission to contact her son to discuss discharge planning further.  CSW contacted patient's son Louann Liv [phone]) to discuss discharge planning, patient's son reported that he is in agreement with patient discharging to SNF for ST rehab. Patient's son reported that they spoke with a caseworker at the hospital regarding patient's medicaid application and that the caseworker reported that patient is likely to be approved. CSW agreed to notify patient's son with any updates.   CSW will complete patient's FL2 and follow up with bed offers.  Employment status:  Disabled (Comment on whether or not currently receiving Disability), Self-Employed Insurance information:  Self Pay (Medicaid Pending) PT Recommendations:  [place], Home with [place], 24 Hour Supervision Information / Referral to community resources:  [place]  Patient/Family's Response to care:  Patient appreciative of CSW assistance with discharge planning.   Patient/Family's Understanding of and Emotional Response to Diagnosis, Current Treatment, and Prognosis:  Patient presented calm and was engaged throughout assessment. Patient verbalized understanding of PT recommendation for SNF and reported she was agreeable to SNF at discharge. Patient's son involved in patient's care and verbalized understanding of current treatment plan.  Emotional Assessment Appearance:  Appears stated age Attitude/Demeanor/Rapport:  Engaged Affect (typically observed):  Calm, Appropriate Orientation:  Oriented to Self, Oriented to Situation, Oriented to Place, Oriented to  Time Alcohol / Substance use:  Not Applicable Psych involvement (Current and /or  in the community):  No (Comment)  Discharge Needs  Concerns to be addressed:  Care  Coordination Readmission within the last 30 days:  Yes Current discharge risk:  Physical Impairment, Lack of support system Barriers to Discharge:  Continued Medical Work up   [employer], [place] 03/16/2018, 4:07 PM

## 2018-03-16 NOTE — Progress Notes (Signed)
Pt reports vaginal bleeding post-menopausal. Has been going on for "many days". RN spoke with MD. H&H will be checked in the morning.

## 2018-03-16 NOTE — Progress Notes (Signed)
Palliative:  I went to visit Tracy Flynn today. She continues to appear fatigued and her food from yesterday is still on bedside table. I told her good morning and she quickly and curtly told me "I really don't feel like talking today." I asked her if there was anything I could help with or get her and she nods her head no. Told her that I am sorry she is having another bad day and I am around if she needs anything.   I am sad that Tracy Flynn is having such a difficult time. We have had such good conversations in the past. I will continue to follow and attempt to offer her support.   No charge  Vinie Sill, NP Palliative Medicine Team Pager # [phone] (M-F 8a-5p) Team Phone # [phone] (Nights/Weekends)

## 2018-03-16 NOTE — Progress Notes (Signed)
PROGRESS NOTE    Tracy Flynn  NOM:[id number] DOB: [birth year] DOA: 03/08/2018 PCP: Antony Blackbird, MD   Brief Narrative: Tracy Flynn is a 62 y.o. female with medical history significant of metastatic ovarian cancer with recurrent malignant ascites and L sided malignant pleural effusion. She presented secondary to shortness of breath with recurrent pleural effusion. S/p thoracentesis and paracentesis.   Assessment & Plan:   Principal Problem:   Malignant pleural effusion: Left Active Problems:   Ovarian tumor   Recurrent pleural effusion on left   Cancer, metastatic (HCC)   Ascites   Recurrent malignant pleural effusion/ascites Secondary to metastatic ovarian cancer. S/p thoracentesis and paracentesis. Will need pleural catheter as an outpatient. -Palliative care visited patient yesterday. Patient not wanting to meet today -PT eval: Recommending SNF. Social work consulted.  Metastatic ovarian cancer Patient was DNR on admission but has requested change to Full Code. She states that she "has to fight for her granddaughter." risks and benefits explained. -Palliative care recommendations  Acute kidney injury Initially thought secondary to hypervolemia. Improving with IV fluids -IV NS, reevaluated in AM. Watch respiratory status -Recheck BMP in AM  Hyperkalemia Up again today Kayexalate   Leukocytosis No source. Asymptomatic. Afebrile.   DVT prophylaxis: SCDs Code Status:   Code Status: Full Code Family Communication: None at bedside Disposition Plan: Discharge to SNF    Consultants:   Interventional radiology  Palliative care  Procedures:   Thoracentesis  Paracentesis  Antimicrobials:  None    Subjective: Tired today. No other issues. No dyspnea.  Objective: Vitals:   03/15/18 0755 03/15/18 1300 03/15/18 2054 03/16/18 1430  BP:  115/76 120/76 117/72  Pulse:  85 (!) 105 87  Resp:  18 18 18   Temp:  98.7 F (37.1 C) 98.3 F (36.8 C) 98.7 F (37.1  C)  TempSrc:  Oral Oral Oral  SpO2: 96% 98% 90% 93%  Weight:      Height:        Intake/Output Summary (Last 24 hours) at 03/16/2018 1556 Last data filed at 03/16/2018 0200 Gross per 24 hour  Intake 837.45 ml  Output -  Net 837.45 ml   Filed Weights   02/22/2018 2107  Weight: 66.2 kg    Examination:  General exam: Appears calm and comfortable. Cachectic.  Respiratory system: Clear to auscultation. Respiratory effort normal. Cardiovascular system: S1 & S2 heard, RRR. No murmurs, rubs, gallops or clicks. Gastrointestinal system: Abdomen is distended, soft and nontender. No organomegaly or masses felt. Normal bowel sounds heard. Central nervous system: Alert and oriented. No focal neurological deficits. Extremities: 2+ edema. No calf tenderness Skin: No cyanosis. No rashes Psychiatry: Judgement and insight appear normal. Depressed mood. Flat affect.    Data Reviewed: I have personally reviewed following labs and imaging studies  CBC: Recent Labs  Lab 03/01/2018 2336 03/15/18 1225 03/16/18 0348  WBC 18.5* 20.1* 21.1*  HGB 13.0 11.9* 11.3*  HCT 40.5 39.0 37.1  MCV 78.0 79.3 78.4  PLT 660* 644* 962*   Basic Metabolic Panel: Recent Labs  Lab 02/25/2018 2336 03/15/18 1225 03/16/18 0348  NA 135 137 134*  K 5.2* 4.9 5.6*  CL 100 100 101  CO2 23 27 23   GLUCOSE 103* 102* 81  BUN 59* 68* 61*  CREATININE 2.00* 2.25* 1.94*  CALCIUM 8.7* 8.7* 8.2*   GFR: Estimated Creatinine Clearance: 31.4 mL/min (A) (by C-G formula based on SCr of 1.94 mg/dL (H)). Liver Function Tests: Recent Labs  Lab 03/13/18 0022  AST 45*  ALT 40  ALKPHOS 679*  BILITOT 0.5  PROT 7.2  ALBUMIN 1.8*   No results for input(s): LIPASE, AMYLASE in the last 168 hours. No results for input(s): AMMONIA in the last 168 hours. Coagulation Profile: No results for input(s): INR, PROTIME in the last 168 hours. Cardiac Enzymes: No results for input(s): CKTOTAL, CKMB, CKMBINDEX, TROPONINI in the last 168  hours. BNP (last 3 results) No results for input(s): PROBNP in the last 8760 hours. HbA1C: No results for input(s): HGBA1C in the last 72 hours. CBG: No results for input(s): GLUCAP in the last 168 hours. Lipid Profile: No results for input(s): CHOL, HDL, LDLCALC, TRIG, CHOLHDL, LDLDIRECT in the last 72 hours. Thyroid Function Tests: No results for input(s): TSH, T4TOTAL, FREET4, T3FREE, THYROIDAB in the last 72 hours. Anemia Panel: No results for input(s): VITAMINB12, FOLATE, FERRITIN, TIBC, IRON, RETICCTPCT in the last 72 hours. Sepsis Labs: No results for input(s): PROCALCITON, LATICACIDVEN in the last 168 hours.  No results found for this or any previous visit (from the past 240 hour(s)).       Radiology Studies: No results found.      Scheduled Meds: . docusate sodium  100 mg Oral BID  . ferrous sulfate  325 mg Oral BID WC  . folic acid  1 mg Oral Daily  . lactulose  30 g Oral Daily  . nystatin  5 mL Oral QID   Continuous Infusions: . sodium chloride 75 mL/hr at 03/16/18 1511     LOS: 1 day     Cordelia Poche, MD [facility] 03/16/2018, 3:56 PM  If 7PM-7AM, please contact night-coverage [email or web address] 03/16/2018, 3:56 PM

## 2018-03-17 LAB — BASIC METABOLIC PANEL
Anion gap: 7 (ref 5–15)
BUN: 56 mg/dL — AB (ref 8–23)
CALCIUM: 7.9 mg/dL — AB (ref 8.9–10.3)
CO2: 22 mmol/L (ref 22–32)
CREATININE: 1.66 mg/dL — AB (ref 0.44–1.00)
Chloride: 106 mmol/L (ref 98–111)
GFR calc Af Amer: 37 mL/min — ABNORMAL LOW (ref >60)
GFR, EST NON AFRICAN AMERICAN: 32 mL/min — AB (ref >60)
GLUCOSE: 113 mg/dL — AB (ref 70–99)
Potassium: 4.8 mmol/L (ref 3.5–5.1)
SODIUM: 135 mmol/L (ref 135–145)

## 2018-03-17 LAB — CBC
HEMATOCRIT: 34.5 % — AB (ref 36.0–46.0)
Hemoglobin: 10.6 g/dL — ABNORMAL LOW (ref 12.0–15.0)
MCH: 24.1 pg — ABNORMAL LOW (ref 26.0–34.0)
MCHC: 30.7 g/dL (ref 30.0–36.0)
MCV: 78.6 fL (ref 78.0–100.0)
PLATELETS: 577 10*3/uL — AB (ref 150–400)
RBC: 4.39 MIL/uL (ref 3.87–5.11)
RDW: 18 % — AB (ref 11.5–15.5)
WBC: 20.3 10*3/uL — AB (ref 4.0–10.5)

## 2018-03-17 NOTE — Progress Notes (Signed)
PROGRESS NOTE    Tracy Flynn  OIZ:[id number] DOB: 01-05-1956 DOA: 02/27/2018 PCP: Antony Blackbird, MD    Brief Narrative: Tracy Flynn is a 62 y.o. femalewith medical history significant ofmetastatic ovarian cancer with recurrent malignant ascites and L sided malignant pleural effusion. She presented secondary to shortness of breath with recurrent pleural effusion. S/p thoracentesis and paracentesis.  Assessment & Plan:   Principal Problem:   Malignant pleural effusion: Left Active Problems:   Ovarian tumor   Recurrent pleural effusion on left   Cancer, metastatic (HCC)   Ascites  Recurrent malignant effusions/ Ascites Sec to met ovarian cancer. S/p thoracentesis and paracentesis.  PT eval recommending SNF.   AKI:  Improving with IV fluids.    Hyperkalemia: resolved.    Leukocytosis:  Asymptomatic.     DVT prophylaxis: scd's Code Status: full code.  Family Communication: none at bedside.  Disposition Plan: SNF when bed available.    Consultants:   IR  Palliative care.    Procedures: None   Antimicrobials: none   Subjective: Wants to go to snf, does not want any treatment for the ovarian cancer. Does not want to walk with gyn oncology.  Does not want to talk to palliative care.    Objective: Vitals:   03/16/18 1430 03/16/18 2107 03/17/18 0435 03/17/18 1338  BP: 117/72 119/83 117/78 112/81  Pulse: 87 (!) 111 97 99  Resp: 18 18 16 16   Temp: 98.7 F (37.1 C) 99 F (37.2 C) 98.4 F (36.9 C) (!) 97.5 F (36.4 C)  TempSrc: Oral Oral Oral Oral  SpO2: 93% 98% 97% 100%  Weight:      Height:        Intake/Output Summary (Last 24 hours) at 03/17/2018 1447 Last data filed at 03/17/2018 1025 Gross per 24 hour  Intake 1711.82 ml  Output 150 ml  Net 1561.82 ml   Filed Weights   03/03/2018 2107  Weight: 66.2 kg    Examination:  General exam: Appears calm and comfortable  Respiratory system: Clear to auscultation. Respiratory effort  normal. Cardiovascular system: S1 & S2 heard, RRR. No JVD, murmurs, rubs, gallops or clicks. No pedal edema. Gastrointestinal system: Abdomen is nondistended, soft and nontender. No organomegaly or masses felt. Normal bowel sounds heard. Central nervous system: Alert and oriented. No focal neurological deficits. Extremities: Symmetric 5 x 5 power. Skin: No rashes, lesions or ulcers Psychiatry: Mood & affect appropriate.     Data Reviewed: I have personally reviewed following labs and imaging studies  CBC: Recent Labs  Lab 03/13/2018 2336 03/15/18 1225 03/16/18 0348 03/17/18 0412  WBC 18.5* 20.1* 21.1* 20.3*  HGB 13.0 11.9* 11.3* 10.6*  HCT 40.5 39.0 37.1 34.5*  MCV 78.0 79.3 78.4 78.6  PLT 660* 644* 532* 338*   Basic Metabolic Panel: Recent Labs  Lab 03/02/2018 2336 03/15/18 1225 03/16/18 0348 03/16/18 1736 03/17/18 0412  NA 135 137 134*  --  135  K 5.2* 4.9 5.6* 5.2* 4.8  CL 100 100 101  --  106  CO2 23 27 23   --  22  GLUCOSE 103* 102* 81  --  113*  BUN 59* 68* 61*  --  56*  CREATININE 2.00* 2.25* 1.94*  --  1.66*  CALCIUM 8.7* 8.7* 8.2*  --  7.9*   GFR: Estimated Creatinine Clearance: 36.7 mL/min (A) (by C-G formula based on SCr of 1.66 mg/dL (H)). Liver Function Tests: Recent Labs  Lab 03/13/18 0022  AST 45*  ALT 40  ALKPHOS 679*  BILITOT 0.5  PROT 7.2  ALBUMIN 1.8*   No results for input(s): LIPASE, AMYLASE in the last 168 hours. No results for input(s): AMMONIA in the last 168 hours. Coagulation Profile: No results for input(s): INR, PROTIME in the last 168 hours. Cardiac Enzymes: No results for input(s): CKTOTAL, CKMB, CKMBINDEX, TROPONINI in the last 168 hours. BNP (last 3 results) No results for input(s): PROBNP in the last 8760 hours. HbA1C: No results for input(s): HGBA1C in the last 72 hours. CBG: No results for input(s): GLUCAP in the last 168 hours. Lipid Profile: No results for input(s): CHOL, HDL, LDLCALC, TRIG, CHOLHDL, LDLDIRECT in the  last 72 hours. Thyroid Function Tests: No results for input(s): TSH, T4TOTAL, FREET4, T3FREE, THYROIDAB in the last 72 hours. Anemia Panel: No results for input(s): VITAMINB12, FOLATE, FERRITIN, TIBC, IRON, RETICCTPCT in the last 72 hours. Sepsis Labs: No results for input(s): PROCALCITON, LATICACIDVEN in the last 168 hours.  No results found for this or any previous visit (from the past 240 hour(s)).       Radiology Studies: No results found.      Scheduled Meds: . docusate sodium  100 mg Oral BID  . ferrous sulfate  325 mg Oral BID WC  . folic acid  1 mg Oral Daily  . nystatin  5 mL Oral QID   Continuous Infusions: . sodium chloride 75 mL/hr at 03/16/18 2200     LOS: 2 days    Time spent: 25 min    Hosie Poisson, MD [facility] Pager [phone] If 7PM-7AM, please contact night-coverage [email or web address] Password [facility] 03/17/2018, 2:47 PM

## 2018-03-17 NOTE — Progress Notes (Signed)
Physical Therapy Treatment Patient Details Name: Tracy Flynn MRN: [id number] DOB: 09-12-1955 Today's Date: 03/17/2018    History of Present Illness 62 yo female admitted with malignant pleural effusion (recurrent)-s/p paracentesis. Hx of met ovarian cancer, recurrent ascites    PT Comments    Pt participated fairly well. She remains weak and fatigues easily with activity. Continue to recommend SNF.    Follow Up Recommendations  SNF;Supervision/Assistance - 24 hour     Equipment Recommendations  None recommended by PT    Recommendations for Other Services       Precautions / Restrictions Precautions Precautions: Fall Restrictions Weight Bearing Restrictions: No    Mobility  Bed Mobility               General bed mobility comments: oob in recliner  Transfers Overall transfer level: Needs assistance   Transfers: Sit to/from Stand Sit to Stand: Min guard         General transfer comment: Close guard for safety.   Ambulation/Gait Ambulation/Gait assistance: Min assist Gait Distance (Feet): 115 Feet Assistive device: IV Pole Gait Pattern/deviations: Step-through pattern;Staggering right;Staggering left;Drifts right/left     General Gait Details: Unsteady. Assist needed to steady pt intermittently. She relied on support of IV pole. Fatigues fairly easily.    Stairs             Wheelchair Mobility    Modified Rankin (Stroke Patients Only)       Balance             Standing balance-Leahy Scale: Fair                              Cognition Arousal/Alertness: Awake/alert Behavior During Therapy: WFL for tasks assessed/performed Overall Cognitive Status: Within Functional Limits for tasks assessed                                        Exercises      General Comments        Pertinent Vitals/Pain Pain Assessment: Faces Faces Pain Scale: Hurts little more Pain Location: abdomen, feet Pain  Intervention(s): Monitored during session;Limited activity within patient's tolerance    Home Living                      Prior Function            PT Goals (current goals can now be found in the care plan section) Progress towards PT goals: Progressing toward goals    Frequency    Min 3X/week      PT Plan Current plan remains appropriate    Co-evaluation              AM-PAC PT "6 Clicks" Daily Activity  Outcome Measure  Difficulty turning over in bed (including adjusting bedclothes, sheets and blankets)?: A Lot Difficulty moving from lying on back to sitting on the side of the bed? : Unable Difficulty sitting down on and standing up from a chair with arms (e.g., wheelchair, bedside commode, etc,.)?: Unable Help needed moving to and from a bed to chair (including a wheelchair)?: A Little Help needed walking in hospital room?: A Little Help needed climbing 3-5 steps with a railing? : A Lot 6 Click Score: 12    End of Session Equipment Utilized During Treatment: Gait belt Activity Tolerance: Patient limited  by fatigue Patient left: in chair;with call bell/phone within reach   PT Visit Diagnosis: Muscle weakness (generalized) (M62.81);Difficulty in walking, not elsewhere classified (R26.2);Unsteadiness on feet (R26.81)     Time: 5927-6394 PT Time Calculation (min) (ACUTE ONLY): 8 min  Charges:  $Gait Training: 8-22 mins                        Weston Anna, PT Acute Rehabilitation Services Pager: [phone] Office: [phone]

## 2018-03-17 NOTE — NC FL2 (Addendum)
[place] LEVEL OF CARE SCREENING TOOL     IDENTIFICATION  Patient Name: Tracy Flynn Birthdate: [birth year] Sex: female Admission Date (Current Location): 03/16/2018  [facility] and [place] Number:  Herbalist and Address:  [facility],  [place] [address], [place]      Provider Number: [id number]  Attending Physician Name and Address:  Hosie Poisson, MD  Relative Name and Phone Number:       Current Level of Care: Hospital Recommended Level of Care: [place] Prior Approval Number:    Date Approved/Denied:   PASRR Number: [id number]  Discharge Plan: SNF    Current Diagnoses: Patient Active Problem List   Diagnosis Date Noted  . Dyspnea   . Palliative care by specialist   . Acute lower UTI 02/16/2018  . Acute respiratory failure with hypoxia ([place]) 02/15/2018  . Malignant pleural effusion: Left 02/15/2018  . Ascites 02/15/2018  . Leukocytosis 02/15/2018  . Cancer, metastatic ([place])   . Goals of care, counseling/discussion   . Palliative care encounter   . Recurrent pleural effusion on left 01/13/2018  . Anasarca 01/12/2018  . Severe protein-calorie malnutrition ([place]) 01/12/2018  . Normochromic normocytic anemia 01/12/2018  . Cyst of ovary   . 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 Mesenteric lymphadenopathy 11/27/2017  . Liver mass 11/27/2017  . Sinus tachycardia 11/27/2017  . Pleural effusion on left 11/27/2017  . Pleural effusion 11/27/2017  . Anemia 11/27/2017  . Thrombocytosis ([place]) 11/27/2017    Orientation RESPIRATION BLADDER Height & Weight     Time, Self, Situation, Place  Normal Continent Weight: 146 lb (66.2 kg) Height:  5\' 11"  (180.3 cm)  BEHAVIORAL SYMPTOMS/MOOD NEUROLOGICAL BOWEL NUTRITION STATUS      Continent Diet(regular)  AMBULATORY STATUS COMMUNICATION OF NEEDS Skin   Limited Assist Verbally Normal                       Personal Care Assistance Level of Assistance  Bathing,  Feeding, Dressing Bathing Assistance: Limited assistance Feeding assistance: Independent Dressing Assistance: Limited assistance     Functional Limitations Info  Sight, Hearing, Speech Sight Info: Adequate Hearing Info: Adequate Speech Info: Adequate    SPECIAL CARE FACTORS FREQUENCY  PT (By licensed PT), OT (By licensed OT)     PT Frequency: 5x/week OT Frequency: 5x/week            Contractures      Additional Factors Info  Code Status, Allergies Code Status Info: Full Code Allergies Info: Codeine;Penicillins           Current Medications (03/17/2018):  This is the current hospital active medication list Current Facility-Administered Medications  Medication Dose Route Frequency Provider Last Rate Last Dose  . acetaminophen (TYLENOL) tablet 650 mg  650 mg Oral Q6H PRN [name], DO   650 mg at 03/21/18 1019   Or  . acetaminophen (TYLENOL) suppository 650 mg  650 mg Rectal Q6H PRN [name], DO      . docusate sodium (COLACE) capsule 100 mg  100 mg Oral BID Jennette Kettle M, DO   100 mg at 03/23/18 1014  . feeding supplement (ENSURE ENLIVE) (ENSURE ENLIVE) liquid 237 mL  237 mL Oral BID BM Hosie Poisson, MD   237 mL at 03/23/18 1015  . ferrous sulfate tablet 325 mg  325 mg Oral BID WC Jennette Kettle M, DO   325 mg at 03/22/18 1020  . folic acid (FOLVITE) tablet  1 mg  1 mg Oral Daily Jennette Kettle M, DO   1 mg at 03/23/18 1014  . hydrOXYzine (ATARAX/VISTARIL) tablet 10 mg  10 mg Oral TID PRN Hosie Poisson, MD   10 mg at 03/22/18 0038  . ibuprofen (ADVIL,MOTRIN) tablet 400 mg  400 mg Oral Q6H PRN Hosie Poisson, MD   400 mg at 03/22/18 0038  . nystatin (MYCOSTATIN) 100000 UNIT/ML suspension 500,000 Units  5 mL Oral QID Mariel Aloe, MD   500,000 Units at 03/23/18 1014  . ondansetron (ZOFRAN) tablet 4 mg  4 mg Oral Q6H PRN [name], DO       Or  . ondansetron [facility]) injection 4 mg  4 mg Intravenous Q6H PRN [name], DO   4 mg at 03/22/18  1842  . pantoprazole (PROTONIX) EC tablet 40 mg  40 mg Oral Daily Hosie Poisson, MD   40 mg at 03/23/18 1014  . traMADol (ULTRAM) tablet 100 mg  100 mg Oral Q6H PRN Mariel Aloe, MD   100 mg at 03/23/18 0806     Discharge Medications: Please see discharge summary for a list of discharge medications.  Relevant Imaging Results:  Relevant Lab Results:   Additional Information SSN [id number]  Burnis Medin, LCSW

## 2018-03-17 NOTE — Progress Notes (Signed)
Palliative:  I came back today to attempt to visit with Deneise Lever. I did not even get all the way through her door before she put her hand up and again curtly replies "I don't want to talk today." I told her that I hope she is feeling better and glad she is up in the chair today. Told her I would be thinking of her.   At this time I will sign off. Patient has no interest in speaking with palliative care at this time. Please reconsult only if patient more open to conversation and support.   No charge  Vinie Sill, NP Palliative Medicine Team Pager # [phone] (M-F 8a-5p) Team Phone # [phone] (Nights/Weekends)

## 2018-03-17 NOTE — Progress Notes (Signed)
CSW following to assist with discharge planning to SNF.   CSW followed up with financial counseling Wilhemena Durie), who confirmed that patient has completed Medicaid application and it is pending. Financial counselor reported that patient's resources may be too high to qualify for medicaid but that they are waiting on a final decision depending on the documents provided by patient. At this time patient's eligibility for medicaid is unknown, waiting to hear back from financial counseling.   CSW followed up with patient at bedside. CSW informed patient that she currently has no SNF options. CSW agreed to follow up with local SNFs to see if there are any SNF options for patient. CSW updated patient about financial counselors report on medicaid eligibility and resources, patient did not reply and just put her head down. CSW agreed to update patient's son.   CSW contacted patient's son Gracen Ringwald [phone]) to provide update, no answer nor option to leave voicemail.  CSW will reach out to local SNFs to see if they are able to offer patient a bed medicaid pending. At this time it is unknown if patient will qualify for medicaid.  Abundio Miu, [place] Social Worker [facility] Cell#: [phone]

## 2018-03-18 ENCOUNTER — Ambulatory Visit: Payer: Self-pay | Admitting: Family Medicine

## 2018-03-18 LAB — HEMOGLOBIN AND HEMATOCRIT, BLOOD
HCT: 37.5 % (ref 36.0–46.0)
Hemoglobin: 11.8 g/dL — ABNORMAL LOW (ref 12.0–15.0)

## 2018-03-18 MED ORDER — PANTOPRAZOLE SODIUM 40 MG PO TBEC
40.0000 mg | DELAYED_RELEASE_TABLET | Freq: Every day | ORAL | Status: DC
  Administered 2018-03-18 – 2018-03-23 (×6): 40 mg via ORAL
  Filled 2018-03-18 (×6): qty 1

## 2018-03-18 MED ORDER — ENSURE ENLIVE PO LIQD
237.0000 mL | Freq: Two times a day (BID) | ORAL | Status: DC
  Administered 2018-03-18 – 2018-03-23 (×5): 237 mL via ORAL

## 2018-03-18 NOTE — Progress Notes (Signed)
PROGRESS NOTE    Tracy Flynn  JJO:[id number] DOB: 02/20/1956 DOA: 02/26/2018 PCP: Antony Blackbird, MD    Brief Narrative: Tracy Flynn is a 62 y.o. femalewith medical history significant ofmetastatic ovarian cancer with recurrent malignant ascites and L sided malignant pleural effusion. She presented secondary to shortness of breath with recurrent pleural effusion. S/p thoracentesis and paracentesis.  Assessment & Plan:   Principal Problem:   Malignant pleural effusion: Left Active Problems:   Ovarian tumor   Recurrent pleural effusion on left   Cancer, metastatic (HCC)   Ascites  Recurrent malignant effusions/ Ascites Sec to met ovarian cancer. S/p thoracentesis and paracentesis.  PT eval recommending SNF.   AKI:  Improving with IV fluids. Repeat BMP today.    Hyperkalemia: resolved.    Leukocytosis: suspect reactive.  Asymptomatic.   Acute anemia of blood loss Possibly from vaginal bleed. Drop in hemoglobin from 13 to 10.  Recheck H&h today .     DVT prophylaxis: scd's Code Status: full code.  Family Communication: none at bedside.  Disposition Plan: SNF when bed available.    Consultants:   IR  Palliative care.    Procedures: None   Antimicrobials: none   Subjective: Pt does not want to talk to palliative care or get any treatment. She wants to go to SNF for rehab.  She is requesting reflux medications.   Objective: Vitals:   03/17/18 1338 03/17/18 2242 03/18/18 0617 03/18/18 1030  BP: 112/81 126/85 115/74 122/84  Pulse: 99 (!) 104 94 (!) 106  Resp: 16 18 16 15   Temp: (!) 97.5 F (36.4 C) 98.7 F (37.1 C) 97.6 F (36.4 C)   TempSrc: Oral Oral Oral   SpO2: 100% 100% 99% 100%  Weight:      Height:        Intake/Output Summary (Last 24 hours) at 03/18/2018 1310 Last data filed at 03/18/2018 0700 Gross per 24 hour  Intake 2231.43 ml  Output -  Net 2231.43 ml   Filed Weights   03/10/2018 2107  Weight: 66.2 kg     Examination:  General exam: Appears calm and comfortable not in distress.  Respiratory system: Clear to auscultation. Respiratory effort normal. Cardiovascular system: S1 & S2 heard, RRR. No JVD, Gastrointestinal system: Abdomen is soft  Mild tenderness. Bowel sounds good.  Central nervous system: Alert and oriented. No focal neurological deficits. Extremities:no pedal edema.  Skin: No rashes, lesions or ulcers Psychiatry: Mood & affect appropriate.     Data Reviewed: I have personally reviewed following labs and imaging studies  CBC: Recent Labs  Lab 03/20/2018 2336 03/15/18 1225 03/16/18 0348 03/17/18 0412  WBC 18.5* 20.1* 21.1* 20.3*  HGB 13.0 11.9* 11.3* 10.6*  HCT 40.5 39.0 37.1 34.5*  MCV 78.0 79.3 78.4 78.6  PLT 660* 644* 532* 160*   Basic Metabolic Panel: Recent Labs  Lab 03/08/2018 2336 03/15/18 1225 03/16/18 0348 03/16/18 1736 03/17/18 0412  NA 135 137 134*  --  135  K 5.2* 4.9 5.6* 5.2* 4.8  CL 100 100 101  --  106  CO2 23 27 23   --  22  GLUCOSE 103* 102* 81  --  113*  BUN 59* 68* 61*  --  56*  CREATININE 2.00* 2.25* 1.94*  --  1.66*  CALCIUM 8.7* 8.7* 8.2*  --  7.9*   GFR: Estimated Creatinine Clearance: 36.7 mL/min (A) (by C-G formula based on SCr of 1.66 mg/dL (H)). Liver Function Tests: Recent Labs  Lab 03/13/18 0022  AST  45*  ALT 40  ALKPHOS 679*  BILITOT 0.5  PROT 7.2  ALBUMIN 1.8*   No results for input(s): LIPASE, AMYLASE in the last 168 hours. No results for input(s): AMMONIA in the last 168 hours. Coagulation Profile: No results for input(s): INR, PROTIME in the last 168 hours. Cardiac Enzymes: No results for input(s): CKTOTAL, CKMB, CKMBINDEX, TROPONINI in the last 168 hours. BNP (last 3 results) No results for input(s): PROBNP in the last 8760 hours. HbA1C: No results for input(s): HGBA1C in the last 72 hours. CBG: No results for input(s): GLUCAP in the last 168 hours. Lipid Profile: No results for input(s): CHOL, HDL,  LDLCALC, TRIG, CHOLHDL, LDLDIRECT in the last 72 hours. Thyroid Function Tests: No results for input(s): TSH, T4TOTAL, FREET4, T3FREE, THYROIDAB in the last 72 hours. Anemia Panel: No results for input(s): VITAMINB12, FOLATE, FERRITIN, TIBC, IRON, RETICCTPCT in the last 72 hours. Sepsis Labs: No results for input(s): PROCALCITON, LATICACIDVEN in the last 168 hours.  No results found for this or any previous visit (from the past 240 hour(s)).       Radiology Studies: No results found.      Scheduled Meds: . docusate sodium  100 mg Oral BID  . feeding supplement (ENSURE ENLIVE)  237 mL Oral BID BM  . ferrous sulfate  325 mg Oral BID WC  . folic acid  1 mg Oral Daily  . nystatin  5 mL Oral QID  . pantoprazole  40 mg Oral Daily   Continuous Infusions: . sodium chloride 75 mL/hr at 03/18/18 0820     LOS: 3 days    Time spent: 25 min    Hosie Poisson, MD [facility] Pager [phone] If 7PM-7AM, please contact night-coverage [email or web address] Password [email or web address] 03/18/2018, 1:10 PM

## 2018-03-19 ENCOUNTER — Inpatient Hospital Stay (HOSPITAL_COMMUNITY): Payer: Self-pay

## 2018-03-19 LAB — BASIC METABOLIC PANEL
Anion gap: 6 (ref 5–15)
BUN: 40 mg/dL — ABNORMAL HIGH (ref 8–23)
CALCIUM: 7.8 mg/dL — AB (ref 8.9–10.3)
CHLORIDE: 106 mmol/L (ref 98–111)
CO2: 20 mmol/L — AB (ref 22–32)
CREATININE: 1.21 mg/dL — AB (ref 0.44–1.00)
GFR calc Af Amer: 54 mL/min — ABNORMAL LOW (ref >60)
GFR calc non Af Amer: 47 mL/min — ABNORMAL LOW (ref >60)
GLUCOSE: 104 mg/dL — AB (ref 70–99)
Potassium: 4.9 mmol/L (ref 3.5–5.1)
Sodium: 132 mmol/L — ABNORMAL LOW (ref 135–145)

## 2018-03-19 MED ORDER — ALUM & MAG HYDROXIDE-SIMETH 200-200-20 MG/5ML PO SUSP
30.0000 mL | Freq: Once | ORAL | Status: AC
  Administered 2018-03-19: 30 mL via ORAL
  Filled 2018-03-19: qty 30

## 2018-03-19 NOTE — Progress Notes (Signed)
PROGRESS NOTE    Tracy Flynn  WGN:[id number] DOB: [birth year] DOA: 03/01/2018 PCP: Antony Blackbird, MD    Brief Narrative: Tracy Flynn is a 62 y.o. femalewith medical history significant ofmetastatic ovarian cancer with recurrent malignant ascites and L sided malignant pleural effusion. She presented secondary to shortness of breath with recurrent pleural effusion. S/p thoracentesis and paracentesis.  Assessment & Plan:   Principal Problem:   Malignant pleural effusion: Left Active Problems:   Ovarian tumor   Recurrent pleural effusion on left   Cancer, metastatic (HCC)   Ascites  Recurrent malignant effusions/ Ascites Sec to met ovarian cancer. S/p thoracentesis and paracentesis.  PT eval recommending SNF.   AKI:  Improving with IV fluids.  Creatinine is 1.21 today.    Hyperkalemia: resolved.    Leukocytosis: suspect reactive.  Asymptomatic.   Acute anemia of blood loss Possibly from vaginal bleed. H&h around 11.8 .    GERD: Improved with protonix.   DVT prophylaxis: scd's Code Status: full code.  Family Communication: none at bedside.  Disposition Plan: SNF when bed available.    Consultants:   IR  Palliative care.    Procedures: None   Antimicrobials: none   Subjective: Pt does not want to talk to palliative care or get any treatment. She wants to go to SNF for rehab.  GERD is stable.    Objective: Vitals:   03/18/18 1030 03/18/18 1400 03/18/18 2145 03/19/18 0555  BP: 122/84 129/86 119/79 130/88  Pulse: (!) 106 (!) 114 (!) 113 (!) 110  Resp: 15  16 18   Temp:   97.8 F (36.6 C) 98 F (36.7 C)  TempSrc:   Oral Oral  SpO2: 100% 97% 100% 98%  Weight:      Height:        Intake/Output Summary (Last 24 hours) at 03/19/2018 1241 Last data filed at 03/19/2018 0700 Gross per 24 hour  Intake 1761.17 ml  Output 325 ml  Net 1436.17 ml   Filed Weights   03/08/2018 2107  Weight: 66.2 kg    Examination:  General exam: Appears calm and  comfortable not in distress.  Respiratory system: Clear to auscultation. Respiratory effort normal. Cardiovascular system: S1 & S2 heard, RRR. No JVD, Gastrointestinal system: Abdomen is soft  Mild tenderness, abdominal distention seen. Bowel sounds good.  Central nervous system: Alert and oriented. Non focal. Extremities:no pedal edema.  Skin: No rashes, lesions or ulcers Psychiatry: Mood & affect appropriate.     Data Reviewed: I have personally reviewed following labs and imaging studies  CBC: Recent Labs  Lab 03/05/2018 2336 03/15/18 1225 03/16/18 0348 03/17/18 0412 03/18/18 1402  WBC 18.5* 20.1* 21.1* 20.3*  --   HGB 13.0 11.9* 11.3* 10.6* 11.8*  HCT 40.5 39.0 37.1 34.5* 37.5  MCV 78.0 79.3 78.4 78.6  --   PLT 660* 644* 532* 577*  --    Basic Metabolic Panel: Recent Labs  Lab 03/09/2018 2336 03/15/18 1225 03/16/18 0348 03/16/18 1736 03/17/18 0412 03/19/18 0429  NA 135 137 134*  --  135 132*  K 5.2* 4.9 5.6* 5.2* 4.8 4.9  CL 100 100 101  --  106 106  CO2 23 27 23   --  22 20*  GLUCOSE 103* 102* 81  --  113* 104*  BUN 59* 68* 61*  --  56* 40*  CREATININE 2.00* 2.25* 1.94*  --  1.66* 1.21*  CALCIUM 8.7* 8.7* 8.2*  --  7.9* 7.8*   GFR: Estimated Creatinine Clearance: 50.4 mL/min (A) (  by C-G formula based on SCr of 1.21 mg/dL (H)). Liver Function Tests: Recent Labs  Lab 03/13/18 0022  AST 45*  ALT 40  ALKPHOS 679*  BILITOT 0.5  PROT 7.2  ALBUMIN 1.8*   No results for input(s): LIPASE, AMYLASE in the last 168 hours. No results for input(s): AMMONIA in the last 168 hours. Coagulation Profile: No results for input(s): INR, PROTIME in the last 168 hours. Cardiac Enzymes: No results for input(s): CKTOTAL, CKMB, CKMBINDEX, TROPONINI in the last 168 hours. BNP (last 3 results) No results for input(s): PROBNP in the last 8760 hours. HbA1C: No results for input(s): HGBA1C in the last 72 hours. CBG: No results for input(s): GLUCAP in the last 168 hours. Lipid  Profile: No results for input(s): CHOL, HDL, LDLCALC, TRIG, CHOLHDL, LDLDIRECT in the last 72 hours. Thyroid Function Tests: No results for input(s): TSH, T4TOTAL, FREET4, T3FREE, THYROIDAB in the last 72 hours. Anemia Panel: No results for input(s): VITAMINB12, FOLATE, FERRITIN, TIBC, IRON, RETICCTPCT in the last 72 hours. Sepsis Labs: No results for input(s): PROCALCITON, LATICACIDVEN in the last 168 hours.  No results found for this or any previous visit (from the past 240 hour(s)).       Radiology Studies: No results found.      Scheduled Meds: . docusate sodium  100 mg Oral BID  . feeding supplement (ENSURE ENLIVE)  237 mL Oral BID BM  . ferrous sulfate  325 mg Oral BID WC  . folic acid  1 mg Oral Daily  . nystatin  5 mL Oral QID  . pantoprazole  40 mg Oral Daily   Continuous Infusions: . sodium chloride 75 mL/hr at 03/19/18 1026     LOS: 4 days    Time spent: 25 min    Hosie Poisson, MD [facility] Pager [phone] If 7PM-7AM, please contact night-coverage [email or web address] Password [facility] 03/19/2018, 12:41 PM

## 2018-03-19 NOTE — Progress Notes (Signed)
Physical Therapy Treatment Patient Details Name: Tracy Flynn MRN: [id number] DOB: April 09, 1956 Today's Date: 03/19/2018    History of Present Illness 62 yo female admitted with malignant pleural effusion (recurrent)-s/p paracentesis. Hx of met ovarian cancer, recurrent ascites    PT Comments    Pt required assist to rise from lower surface today and provided min assist for steadying during ambulation.  Pt high risk for falls.  Continue to recommend SNF upon d/c.  Follow Up Recommendations  SNF;Supervision/Assistance - 24 hour     Equipment Recommendations  None recommended by PT    Recommendations for Other Services       Precautions / Restrictions Precautions Precautions: Fall Restrictions Weight Bearing Restrictions: No    Mobility  Bed Mobility               General bed mobility comments: oob in recliner  Transfers Overall transfer level: Needs assistance Equipment used: Rolling walker (2 wheeled) Transfers: Sit to/from Stand Sit to Stand: Mod assist         General transfer comment: pt attempted however too difficult from low recliner surface and pt requested boost assist to rise  Ambulation/Gait Ambulation/Gait assistance: Min assist Gait Distance (Feet): 80 Feet Assistive device: IV Pole Gait Pattern/deviations: Step-through pattern;Staggering left;Staggering right;Decreased stride length;Decreased dorsiflexion - left;Decreased dorsiflexion - right Gait velocity: decreased   General Gait Details: requires assist for steadying, declined use of RW and instead placed both hands on IV pole for support, fatigued quickly   Stairs             Wheelchair Mobility    Modified Rankin (Stroke Patients Only)       Balance Overall balance assessment: Needs assistance         Standing balance support: No upper extremity supported Standing balance-Leahy Scale: Fair Standing balance comment: requires UE support for any challenges and unsteady  when ambulating-at risk for falls                            Cognition Arousal/Alertness: Awake/alert Behavior During Therapy: WFL for tasks assessed/performed Overall Cognitive Status: Within Functional Limits for tasks assessed                                        Exercises      General Comments        Pertinent Vitals/Pain Pain Assessment: No/denies pain    Home Living                      Prior Function            PT Goals (current goals can now be found in the care plan section) Progress towards PT goals: Progressing toward goals    Frequency    Min 3X/week      PT Plan Current plan remains appropriate    Co-evaluation              AM-PAC PT "6 Clicks" Daily Activity  Outcome Measure  Difficulty turning over in bed (including adjusting bedclothes, sheets and blankets)?: A Lot Difficulty moving from lying on back to sitting on the side of the bed? : Unable Difficulty sitting down on and standing up from a chair with arms (e.g., wheelchair, bedside commode, etc,.)?: Unable Help needed moving to and from a bed to chair (including a wheelchair)?:  A Lot Help needed walking in hospital room?: A Little Help needed climbing 3-5 steps with a railing? : A Lot 6 Click Score: 11    End of Session Equipment Utilized During Treatment: Gait belt Activity Tolerance: Patient limited by fatigue Patient left: in chair;with call bell/phone within reach   PT Visit Diagnosis: Muscle weakness (generalized) (M62.81);Difficulty in walking, not elsewhere classified (R26.2);Unsteadiness on feet (R26.81)     Time: 1100-1115 PT Time Calculation (min) (ACUTE ONLY): 15 min  Charges:  $Gait Training: 8-22 mins                     Carmelia Bake, PT, DPT Acute Rehabilitation Services Office: [phone] Pager: [phone]  Trena Platt 03/19/2018, 1:25 PM

## 2018-03-20 ENCOUNTER — Other Ambulatory Visit: Payer: Self-pay

## 2018-03-20 ENCOUNTER — Inpatient Hospital Stay (HOSPITAL_COMMUNITY): Payer: Self-pay

## 2018-03-20 LAB — BASIC METABOLIC PANEL
ANION GAP: 9 (ref 5–15)
BUN: 44 mg/dL — AB (ref 8–23)
CALCIUM: 8.4 mg/dL — AB (ref 8.9–10.3)
CHLORIDE: 106 mmol/L (ref 98–111)
CO2: 20 mmol/L — AB (ref 22–32)
Creatinine, Ser: 1.54 mg/dL — ABNORMAL HIGH (ref 0.44–1.00)
GFR calc Af Amer: 41 mL/min — ABNORMAL LOW (ref >60)
GFR calc non Af Amer: 35 mL/min — ABNORMAL LOW (ref >60)
GLUCOSE: 124 mg/dL — AB (ref 70–99)
POTASSIUM: 5.8 mmol/L — AB (ref 3.5–5.1)
SODIUM: 135 mmol/L (ref 135–145)

## 2018-03-20 LAB — CBC WITH DIFFERENTIAL/PLATELET
BASOS ABS: 0 10*3/uL (ref 0.0–0.1)
Basophils Relative: 0 %
EOS ABS: 0 10*3/uL (ref 0.0–0.7)
EOS PCT: 0 %
HCT: 41.8 % (ref 36.0–46.0)
HEMOGLOBIN: 13 g/dL (ref 12.0–15.0)
LYMPHS ABS: 0.7 10*3/uL (ref 0.7–4.0)
LYMPHS PCT: 3 %
MCH: 25.1 pg — AB (ref 26.0–34.0)
MCHC: 31.1 g/dL (ref 30.0–36.0)
MCV: 80.7 fL (ref 78.0–100.0)
Monocytes Absolute: 1.3 10*3/uL — ABNORMAL HIGH (ref 0.1–1.0)
Monocytes Relative: 5 %
NEUTROS PCT: 92 %
Neutro Abs: 21.8 10*3/uL — ABNORMAL HIGH (ref 1.7–7.7)
PLATELETS: 595 10*3/uL — AB (ref 150–400)
RBC: 5.18 MIL/uL — ABNORMAL HIGH (ref 3.87–5.11)
RDW: 17.9 % — ABNORMAL HIGH (ref 11.5–15.5)
WBC: 23.8 10*3/uL — AB (ref 4.0–10.5)

## 2018-03-20 MED ORDER — SODIUM POLYSTYRENE SULFONATE 15 GM/60ML PO SUSP
30.0000 g | Freq: Once | ORAL | Status: AC
  Administered 2018-03-20: 30 g via ORAL
  Filled 2018-03-20: qty 120

## 2018-03-20 MED ORDER — FUROSEMIDE 20 MG PO TABS
20.0000 mg | ORAL_TABLET | Freq: Every day | ORAL | Status: DC
  Administered 2018-03-20 – 2018-03-21 (×2): 20 mg via ORAL
  Filled 2018-03-20 (×2): qty 1

## 2018-03-20 MED ORDER — LIDOCAINE HCL 1 % IJ SOLN
INTRAMUSCULAR | Status: AC
  Filled 2018-03-20: qty 10

## 2018-03-20 NOTE — Procedures (Signed)
PROCEDURE SUMMARY:  Successful US guided paracentesis from right lateral abdomen.  Yielded 4.4 liters of chylous fluid.  No immediate complications.  Pt tolerated well.   Specimen was sent for labs.  Docia Barrier PA-C 03/20/2018 10:28 AM

## 2018-03-20 NOTE — Progress Notes (Addendum)
PROGRESS NOTE    Tracy Flynn  ZCH:[id number] DOB: 07-18-1955 DOA: 03/14/2018 PCP: Antony Blackbird, MD    Brief Narrative: Tracy Flynn is a 62 y.o. femalewith medical history significant ofmetastatic ovarian cancer with recurrent malignant ascites and L sided malignant pleural effusion. She presented secondary to shortness of breath with recurrent pleural effusion. S/p thoracentesis and paracentesis.  Assessment & Plan:   Principal Problem:   Malignant pleural effusion: Left Active Problems:   Ovarian tumor   Recurrent pleural effusion on left   Cancer, metastatic (HCC)   Ascites  Recurrent malignant effusions/ Ascites Sec to met ovarian cancer. S/p thoracentesis and paracentesis. Repeat paracentesis today for increasing abdominal distention. Stopped the IV fluids.  PT eval recommending SNF.    Dyspnea:  Pt reported some dyspnea yesterday, CXR shows unchanged left effusion.  IR consulted for thoracentesis and possible left pleurex catheter placement.   AKI:  Creatinine started to increase to 1.54.  Fluid overloaded with pleural effusions, bilateral pedal edema and ascites.     Hyperkalemia: order kayexalate. Get 12 LEAD ekg.     Leukocytosis: suspect reactive.  Asymptomatic.   Acute anemia of blood loss Possibly from vaginal bleed. H&H is around 13.     GERD: Improved with protonix.   DVT prophylaxis: scd's Code Status: full code.  Family Communication: none at bedside.  Disposition Plan: SNF when bed available.    Consultants:   IR  Palliative care.    Procedures: None   Antimicrobials: none   Subjective: Some sob. Nauseated. Abdomen is getting bigger.    Objective: Vitals:   03/20/18 1010 03/20/18 1017 03/20/18 1020 03/20/18 1028  BP: 118/84 119/85 119/78 115/76  Pulse:      Resp:      Temp:      TempSrc:      SpO2:      Weight:      Height:        Intake/Output Summary (Last 24 hours) at 03/20/2018 1622 Last data filed at  03/20/2018 1500 Gross per 24 hour  Intake 2270.57 ml  Output -  Net 2270.57 ml   Filed Weights   03/08/2018 2107  Weight: 66.2 kg    Examination:  General exam: frail lady cachetic looking.  Respiratory system: diminished more on the left than right today. No wheezing or rhonchi.  Cardiovascular system: S1 & S2 heard, RRR. No JVD, Gastrointestinal system: Abdomen is soft mild gen tenderness.  Central nervous system: Alert and oriented. Non focal. Extremities:pedal edema present.  Skin: No rashes, lesions or ulcers Psychiatry: Mood & affect appropriate.     Data Reviewed: I have personally reviewed following labs and imaging studies  CBC: Recent Labs  Lab 03/15/18 1225 03/16/18 0348 03/17/18 0412 03/18/18 1402 03/20/18 0823  WBC 20.1* 21.1* 20.3*  --  23.8*  NEUTROABS  --   --   --   --  21.8*  HGB 11.9* 11.3* 10.6* 11.8* 13.0  HCT 39.0 37.1 34.5* 37.5 41.8  MCV 79.3 78.4 78.6  --  80.7  PLT 644* 532* 577*  --  287*   Basic Metabolic Panel: Recent Labs  Lab 03/15/18 1225 03/16/18 0348 03/16/18 1736 03/17/18 0412 03/19/18 0429 03/20/18 0823  NA 137 134*  --  135 132* 135  K 4.9 5.6* 5.2* 4.8 4.9 5.8*  CL 100 101  --  106 106 106  CO2 27 23  --  22 20* 20*  GLUCOSE 102* 81  --  113* 104* 124*  BUN  68* 61*  --  56* 40* 44*  CREATININE 2.25* 1.94*  --  1.66* 1.21* 1.54*  CALCIUM 8.7* 8.2*  --  7.9* 7.8* 8.4*   GFR: Estimated Creatinine Clearance: 39.6 mL/min (A) (by C-G formula based on SCr of 1.54 mg/dL (H)). Liver Function Tests: No results for input(s): AST, ALT, ALKPHOS, BILITOT, PROT, ALBUMIN in the last 168 hours. No results for input(s): LIPASE, AMYLASE in the last 168 hours. No results for input(s): AMMONIA in the last 168 hours. Coagulation Profile: No results for input(s): INR, PROTIME in the last 168 hours. Cardiac Enzymes: No results for input(s): CKTOTAL, CKMB, CKMBINDEX, TROPONINI in the last 168 hours. BNP (last 3 results) No results for  input(s): PROBNP in the last 8760 hours. HbA1C: No results for input(s): HGBA1C in the last 72 hours. CBG: No results for input(s): GLUCAP in the last 168 hours. Lipid Profile: No results for input(s): CHOL, HDL, LDLCALC, TRIG, CHOLHDL, LDLDIRECT in the last 72 hours. Thyroid Function Tests: No results for input(s): TSH, T4TOTAL, FREET4, T3FREE, THYROIDAB in the last 72 hours. Anemia Panel: No results for input(s): VITAMINB12, FOLATE, FERRITIN, TIBC, IRON, RETICCTPCT in the last 72 hours. Sepsis Labs: No results for input(s): PROCALCITON, LATICACIDVEN in the last 168 hours.  No results found for this or any previous visit (from the past 240 hour(s)).       Radiology Studies: US Paracentesis  Result Date: 03/20/2018 INDICATION: Patient with malignant ascites. Request is made for therapeutic paracentesis. EXAM: ULTRASOUND GUIDED THERAPEUTIC PARACENTESIS MEDICATIONS: 10 mL 1% lidocaine COMPLICATIONS: None immediate. PROCEDURE: Informed written consent was obtained from the patient after a discussion of the risks, benefits and alternatives to treatment. A timeout was performed prior to the initiation of the procedure. Initial ultrasound scanning demonstrates a moderate amount of ascites within the right lower abdominal quadrant. The right lower abdomen was prepped and draped in the usual sterile fashion. 1% lidocaine with epinephrine was used for local anesthesia. Following this, a 19 gauge, 7-cm, Yueh catheter was introduced. An ultrasound image was saved for documentation purposes. The paracentesis was performed. The catheter was removed and a dressing was applied. The patient tolerated the procedure well without immediate post procedural complication. FINDINGS: A total of approximately 4.4 liters of chylous fluid was removed. IMPRESSION: Successful ultrasound-guided paracentesis yielding 4.4 liters of peritoneal fluid. Read by: Brynda Greathouse PA-C Electronically Signed   By: Marybelle Killings M.D.    On: 03/20/2018 11:05   Dg Chest Port 1 View  Result Date: 03/19/2018 CLINICAL DATA:  Increased shortness of breath. EXAM: PORTABLE CHEST 1 VIEW COMPARISON:  March 13, 2018 FINDINGS: Large effusion with underlying opacification of the left chest, stable. Poorly aerated left lung. The right lung remains clear. There is volume loss on the left with shift of the heart mediastinum to the left. No pneumothorax. IMPRESSION: No change in the large left pleural effusion, underlying opacity, and left-sided volume loss. Electronically Signed   By: Dorise Bullion III M.D   On: 03/19/2018 20:42        Scheduled Meds: . docusate sodium  100 mg Oral BID  . feeding supplement (ENSURE ENLIVE)  237 mL Oral BID BM  . ferrous sulfate  325 mg Oral BID WC  . folic acid  1 mg Oral Daily  . lidocaine      . nystatin  5 mL Oral QID  . pantoprazole  40 mg Oral Daily   Continuous Infusions: . sodium chloride 75 mL/hr at 03/20/18 1500  LOS: 5 days    Time spent: 25 min    Hosie Poisson, MD [facility] Pager [phone] If 7PM-7AM, please contact night-coverage [email or web address] Password [facility] 03/20/2018, 4:22 PM

## 2018-03-21 LAB — BASIC METABOLIC PANEL
Anion gap: 8 (ref 5–15)
BUN: 44 mg/dL — ABNORMAL HIGH (ref 8–23)
CO2: 23 mmol/L (ref 22–32)
Calcium: 8 mg/dL — ABNORMAL LOW (ref 8.9–10.3)
Chloride: 103 mmol/L (ref 98–111)
Creatinine, Ser: 1.44 mg/dL — ABNORMAL HIGH (ref 0.44–1.00)
GFR calc Af Amer: 44 mL/min — ABNORMAL LOW (ref >60)
GFR, EST NON AFRICAN AMERICAN: 38 mL/min — AB (ref >60)
GLUCOSE: 74 mg/dL (ref 70–99)
POTASSIUM: 5.2 mmol/L — AB (ref 3.5–5.1)
Sodium: 134 mmol/L — ABNORMAL LOW (ref 135–145)

## 2018-03-21 LAB — IRON AND TIBC: Iron: 11 ug/dL — ABNORMAL LOW (ref 28–170)

## 2018-03-21 MED ORDER — IBUPROFEN 200 MG PO TABS
400.0000 mg | ORAL_TABLET | Freq: Four times a day (QID) | ORAL | Status: DC | PRN
  Administered 2018-03-22: 400 mg via ORAL
  Filled 2018-03-21: qty 2

## 2018-03-21 MED ORDER — HYDROXYZINE HCL 10 MG PO TABS
10.0000 mg | ORAL_TABLET | Freq: Three times a day (TID) | ORAL | Status: DC | PRN
  Administered 2018-03-21 – 2018-03-22 (×2): 10 mg via ORAL
  Filled 2018-03-21 (×2): qty 1

## 2018-03-21 NOTE — Consult Note (Signed)
Chief Complaint: Patient was seen in consultation today for recurrent pleural effusion  Referring Physician(s): Dr. Karleen Hampshire  Supervising Physician: Marybelle Killings  Patient Status: [facility] - In-pt  History of Present Illness: Tracy Flynn is a 62 y.o. female with past medical history of metastatic ovarian cancer, recurrent malignant ascites, and recurrent left pleural effusion.  Patient complains of shortness of breath.   Request is made for PleurX catheter placement for her recurrent pleural effusions. Patient will likely continue to need paracentesis in the future.  Case reviewed and approved by Dr. Barbie Banner.   Past Medical History:  Diagnosis Date  . Pleural effusion   . Uterine fibroid     Past Surgical History:  Procedure Laterality Date  . HERNIA REPAIR    . IR PARACENTESIS  01/15/2018  . IR THORACENTESIS ASP PLEURAL SPACE W/IMG GUIDE  12/11/2017  . IR THORACENTESIS ASP PLEURAL SPACE W/IMG GUIDE  12/29/2017  . IR THORACENTESIS ASP PLEURAL SPACE W/IMG GUIDE  01/13/2018    Allergies: Codeine and Penicillins  Medications: Prior to Admission medications   Medication Sig Start Date End Date Taking? Authorizing Provider  cetirizine (ZYRTEC) 10 MG tablet Take 1 tablet (10 mg total) by mouth daily. Patient taking differently: Take 10 mg by mouth daily as needed for allergies.  12/10/17  Yes Ena Dawley, Tiffany S, PA-C  docusate sodium (COLACE) 100 MG capsule Take 1 capsule (100 mg total) by mouth 2 (two) times daily. 02/20/18  Yes Lavina Hamman, MD  ferrous sulfate 325 (65 FE) MG tablet Take 1 tablet (325 mg total) by mouth 2 (two) times daily with a meal. 02/25/18  Yes Fulp, Cammie, MD  folic acid (FOLVITE) 1 MG tablet Take 1 tablet (1 mg total) by mouth daily. 01/17/18  Yes Georgette Shell, MD  lactulose (CHRONULAC) 10 GM/15ML solution Take 45 mLs (30 g total) by mouth daily. 02/21/18  Yes Lavina Hamman, MD  potassium chloride (K-DUR) 10 MEQ tablet Take 2 tablets (20 mEq total) by mouth  daily. MUST MAKE APPT FOR FURTHER REFILLS 03/10/18  Yes Fulp, Cammie, MD  torsemide (DEMADEX) 20 MG tablet Take 20 mg by mouth daily, can take 20 mg extra for weight gain of 3 lbs in 1 day. MUST MAKE APPT FOR FURTHER REFILLS 03/10/18  Yes Fulp, Cammie, MD  traMADol (ULTRAM) 50 MG tablet Take 100 mg by mouth every 6 (six) hours as needed for moderate pain.    Yes [provider]     Family History  Problem Relation Age of Onset  . Cancer Father     Social History   Socioeconomic History  . Marital status: Single    Spouse name: Not on file  . Number of children: Not on file  . Years of education: Not on file  . Highest education level: Not on file  Occupational History  . Not on file  Social Needs  . Financial resource strain: Not on file  . Food insecurity:    Worry: Not on file    Inability: Not on file  . Transportation needs:    Medical: Not on file    Non-medical: Not on file  Tobacco Use  . Smoking status: Former Research scientist (life sciences)  . Smokeless tobacco: Never Used  Substance and Sexual Activity  . Alcohol use: Never    Frequency: Never  . Drug use: Never  . Sexual activity: Not Currently  Lifestyle  . Physical activity:    Days per week: Not on file  Minutes per session: Not on file  . Stress: Not on file  Relationships  . Social connections:    Talks on phone: Not on file    Gets together: Not on file    Attends religious service: Not on file    Active member of club or organization: Not on file    Attends meetings of clubs or organizations: Not on file    Relationship status: Not on file  Other Topics Concern  . Not on file  Social History Narrative  . Not on file     Review of Systems: A 12 point ROS discussed and pertinent positives are indicated in the HPI above.  All other systems are negative.  Review of Systems  Constitutional: Negative for fatigue and fever.  Respiratory: Positive for shortness of breath. Negative for cough.   Cardiovascular:  Negative for chest pain.  Gastrointestinal: Positive for abdominal pain and nausea. Negative for diarrhea and vomiting.  Musculoskeletal: Negative for back pain.  Psychiatric/Behavioral: Negative for behavioral problems and confusion.    Vital Signs: BP 128/84 (BP Location: Left Arm)   Pulse (!) 104   Temp 97.8 F (36.6 C) (Oral)   Resp 16   Ht 5\' 11"  (1.803 m)   Wt 146 lb (66.2 kg)   SpO2 99%   BMI 20.36 kg/m   Physical Exam  Constitutional: She is oriented to person, place, and time. She appears well-developed. No distress.  Cardiovascular: Regular rhythm and normal heart sounds.  tachycardia  Pulmonary/Chest: Effort normal. No respiratory distress.  Diminished breath sounds on right  Neurological: She is alert and oriented to person, place, and time.  Skin: Skin is warm and dry. She is not diaphoretic.  Psychiatric: She has a normal mood and affect. Her behavior is normal. Judgment and thought content normal.  Nursing note and vitals reviewed.    MD Evaluation Airway: WNL Heart: WNL Abdomen: WNL Chest/ Lungs: WNL ASA  Classification: 3 Mallampati/Airway Score: One   Imaging: Dg Chest 1 View  Result Date: 03/13/2018 CLINICAL DATA:  Status post left thoracentesis. EXAM: CHEST  1 VIEW COMPARISON:  03/07/2018 FINDINGS: There is decreased fluid on the left following thoracentesis. There is still significant opacity extending from the left mid to left lower hemithorax obscuring the left heart border and hemidiaphragm. This is consistent with a combination of lung consolidation and/or atelectasis and residual fluid. No pneumothorax. Right lung remains clear. IMPRESSION: 1. Decreased left pleural fluid following thoracentesis. No pneumothorax. Electronically Signed   By: Lajean Manes M.D.   On: 03/13/2018 10:27   Dg Chest 1 View  Result Date: 03/02/2018 CLINICAL DATA:  Status post thoracentesis. EXAM: CHEST  1 VIEW COMPARISON:  Radiograph of March 01, 2018. FINDINGS:  Grossly stable large left pleural effusion is noted with probable atelectasis. No pneumothorax is noted. Right lung is unremarkable except for minimal pleural effusion. Bony thorax is unremarkable. IMPRESSION: No significant change in large left-sided pleural effusion. No definite pneumothorax is noted. Electronically Signed   By: Marijo Conception, M.D.   On: 03/02/2018 15:53   Dg Chest 2 View  Result Date: 03/03/2018 CLINICAL DATA:  Former smoker with dyspnea beginning last week. EXAM: CHEST - 2 VIEW COMPARISON:  03/01/2018 and CXR 03/02/2018 FINDINGS: Unchanged large left pleural effusion spanning at least 6 vertebral body heights on the lateral view. This pleural effusion obscures much of the left heart border. The aortic arch is nonaneurysmal. Right heart border is obscured by overlap with the thoracic spine.  There is dextroconvex curvature of the thoracic spine as before. The right lung is clear though the apex is excluded on the frontal view. Metallic staples project over the superior mediastinum as before. IMPRESSION: Unchanged large left pleural effusion. Electronically Signed   By: Ashley Royalty M.D.   On: 03/20/2018 22:33   Dg Chest 2 View  Result Date: 03/01/2018 CLINICAL DATA:  Abdominal pain and swelling. History of ovarian cancer and pleural effusions. EXAM: CHEST - 2 VIEW COMPARISON:  Single-view of the chest 02/19/2018. FINDINGS: Large left pleural effusion results in near complete whiteout of the left chest, unchanged. Trace right pleural effusion is noted. Compressive atelectasis is seen on the left. No pneumothorax. Cardiac silhouette is obscured. Scoliosis is noted. IMPRESSION: No change in a very large left pleural effusion. Trace right pleural effusion is also noted. Electronically Signed   By: Inge Rise M.D.   On: 03/01/2018 12:34   Dg Abd 1 View  Result Date: 03/13/2018 CLINICAL DATA:  Nausea and vomiting.  Ascites. EXAM: ABDOMEN - 1 VIEW COMPARISON:  No comparison studies  available. FINDINGS: No gaseous bowel dilatation to suggest obstruction. Diffuse hazy soft tissue attenuation over the abdomen is compatible with ascites and there is some centralization of bowel loops. Sclerotic lesion right ninth rib likely a bone island. Lumbar scoliosis IMPRESSION: Diffuse hazy opacity over the abdomen with some centralization of bowel loops. Imaging features are compatible with reported history of ascites Electronically Signed   By: Misty Stanley M.D.   On: 03/13/2018 14:46   Ct Angio Chest Pe W And/or Wo Contrast  Result Date: 03/01/2018 CLINICAL DATA:  Shortness of breath, pleural effusion, abdominal swelling. EXAM: CT ANGIOGRAPHY CHEST WITH CONTRAST TECHNIQUE: Multidetector CT imaging of the chest was performed using the standard protocol during bolus administration of intravenous contrast. Multiplanar CT image reconstructions and MIPs were obtained to evaluate the vascular anatomy. CONTRAST:  122mL ISOVUE-370 IOPAMIDOL (ISOVUE-370) INJECTION 76% COMPARISON:  Chest x-ray earlier today.  Chest CT 11/26/2017 FINDINGS: Cardiovascular:  Heart is normal size.  Aorta is normal caliber. Mediastinum/Nodes: No visible adenopathy. Lungs/Pleura: Very large left pleural effusion with only a small amount of aerated left upper lobe. Compression of the remainder of the left lung. Small to moderate right pleural effusion. Minimal atelectasis at the right lung base. Upper Abdomen: Large volume ascites noted in the upper abdomen. Musculoskeletal: No acute bony abnormality. Scoliosis and degenerative changes in the thoracic spine. Review of the MIP images confirms the above findings. IMPRESSION: Very large left pleural effusion with only a small amount of aerated left upper lobe. Small to moderate right effusion. Large volume ascites noted in the upper abdomen. Electronically Signed   By: Rolm Baptise M.D.   On: 03/01/2018 14:57   US Paracentesis  Result Date: 03/20/2018 INDICATION: Patient with  malignant ascites. Request is made for therapeutic paracentesis. EXAM: ULTRASOUND GUIDED THERAPEUTIC PARACENTESIS MEDICATIONS: 10 mL 1% lidocaine COMPLICATIONS: None immediate. PROCEDURE: Informed written consent was obtained from the patient after a discussion of the risks, benefits and alternatives to treatment. A timeout was performed prior to the initiation of the procedure. Initial ultrasound scanning demonstrates a moderate amount of ascites within the right lower abdominal quadrant. The right lower abdomen was prepped and draped in the usual sterile fashion. 1% lidocaine with epinephrine was used for local anesthesia. Following this, a 19 gauge, 7-cm, Yueh catheter was introduced. An ultrasound image was saved for documentation purposes. The paracentesis was performed. The catheter was removed and a dressing  was applied. The patient tolerated the procedure well without immediate post procedural complication. FINDINGS: A total of approximately 4.4 liters of chylous fluid was removed. IMPRESSION: Successful ultrasound-guided paracentesis yielding 4.4 liters of peritoneal fluid. Read by: Brynda Greathouse PA-C Electronically Signed   By: Marybelle Killings M.D.   On: 03/20/2018 11:05   US Paracentesis  Result Date: 03/16/2018 INDICATION: Patient with history of metastatic gynecological cancer, recurrent left pleural effusion and ascites. Request made for therapeutic paracentesis. EXAM: ULTRASOUND GUIDED THERAPEUTIC PARACENTESIS MEDICATIONS: None COMPLICATIONS: None immediate PROCEDURE: Informed written consent was obtained from the patient after a discussion of the risks, benefits and alternatives to treatment. A timeout was performed prior to the initiation of the procedure. Initial ultrasound scanning demonstrates a large amount of ascites within the right lower abdominal quadrant. The right lower abdomen was prepped and draped in the usual sterile fashion. 1% lidocaine was used for local anesthesia. Following  this, a 19 gauge, 7-cm, Yueh catheter was introduced. An ultrasound image was saved for documentation purposes. The paracentesis was performed. The catheter was removed and a dressing was applied. The patient tolerated the procedure well without immediate post procedural complication. FINDINGS: A total of approximately 6.8 liters of chylous/milky fluid was removed. IMPRESSION: Successful ultrasound-guided therapeutic paracentesis yielding 6.8 liters of peritoneal fluid. Read by: Rowe Robert, PA-C Electronically Signed   By: Aletta Edouard M.D.   On: 03/14/2018 16:10   US Paracentesis  Result Date: 03/03/2018 INDICATION: Patient with history of metastatic gynecological cancer, recurrent ascites. Request made for therapeutic paracentesis up to 5.5 liters. EXAM: ULTRASOUND GUIDED THERAPEUTIC PARACENTESIS MEDICATIONS: None COMPLICATIONS: None immediate. PROCEDURE: Informed written consent was obtained from the patient after a discussion of the risks, benefits and alternatives to treatment. A timeout was performed prior to the initiation of the procedure. Initial ultrasound scanning demonstrates a large amount of ascites within the left lower abdominal quadrant. The left lower abdomen was prepped and draped in the usual sterile fashion. 1% lidocaine was used for local anesthesia. Following this, a 19 gauge, 7-cm, Yueh catheter was introduced. An ultrasound image was saved for documentation purposes. The paracentesis was performed. The catheter was removed and a dressing was applied. The patient tolerated the procedure well without immediate post procedural complication. FINDINGS: A total of approximately 5.5 liters of chylous/milky fluid was removed. IMPRESSION: Successful ultrasound-guided therapeutic paracentesis yielding 5.5 liters of peritoneal fluid. Read by: Rowe Robert, PA-C Electronically Signed   By: Jacqulynn Cadet M.D.   On: 03/03/2018 11:56   US Paracentesis  Result Date: 02/20/2018 INDICATION:  Metastatic ovarian cancer. Recurrent ascites. Request for therapeutic paracentesis. EXAM: ULTRASOUND GUIDED LEFT LOWER QUADRANT PARACENTESIS MEDICATIONS: None. COMPLICATIONS: None immediate. PROCEDURE: Informed written consent was obtained from the patient after a discussion of the risks, benefits and alternatives to treatment. A timeout was performed prior to the initiation of the procedure. Initial ultrasound scanning demonstrates a large amount of ascites within the left lower abdominal quadrant. The left lower abdomen was prepped and draped in the usual sterile fashion. 1% lidocaine with epinephrine was used for local anesthesia. Following this, a 19 gauge, 10-cm, Yueh catheter was introduced. An ultrasound image was saved for documentation purposes. The paracentesis was performed. The catheter was removed and a dressing was applied. The patient tolerated the procedure well without immediate post procedural complication. FINDINGS: A total of approximately 5.5 L of milky, chylous fluid was removed. IMPRESSION: Successful ultrasound-guided paracentesis yielding 5.5 liters of peritoneal fluid. Read by: Ascencion Dike PA-C Electronically Signed  By: Eugenie Filler M.D.   On: 02/20/2018 11:13   Dg Chest Port 1 View  Result Date: 03/19/2018 CLINICAL DATA:  Increased shortness of breath. EXAM: PORTABLE CHEST 1 VIEW COMPARISON:  March 13, 2018 FINDINGS: Large effusion with underlying opacification of the left chest, stable. Poorly aerated left lung. The right lung remains clear. There is volume loss on the left with shift of the heart mediastinum to the left. No pneumothorax. IMPRESSION: No change in the large left pleural effusion, underlying opacity, and left-sided volume loss. Electronically Signed   By: Dorise Bullion III M.D   On: 03/19/2018 20:42   US Thoracentesis Asp Pleural Space W/img Guide  Result Date: 03/13/2018 INDICATION: Shortness of breath, recurrent left malignant effusion EXAM: ULTRASOUND  GUIDED LEFT THORACENTESIS MEDICATIONS: 1% lidocaine local COMPLICATIONS: None immediate. PROCEDURE: An ultrasound guided thoracentesis was thoroughly discussed with the patient and questions answered. The benefits, risks, alternatives and complications were also discussed. The patient understands and wishes to proceed with the procedure. Written consent was obtained. Ultrasound was performed to localize and mark an adequate pocket of fluid in the left chest. The area was then prepped and draped in the normal sterile fashion. 1% Lidocaine was used for local anesthesia. Under ultrasound guidance a 6 Fr Safe-T-Centesis catheter was introduced. Thoracentesis was performed. The catheter was removed and a dressing applied. FINDINGS: A total of approximately 900 cc of blood-tinged pleural fluid was removed. Sample was not sent for laboratory analysis IMPRESSION: Successful ultrasound guided left thoracentesis yielding 900 cc of pleural fluid. Electronically Signed   By: Jerilynn Mages.  Shick M.D.   On: 03/13/2018 10:18   US Thoracentesis Asp Pleural Space W/img Guide  Result Date: 03/02/2018 INDICATION: Patient with history of metastatic gynecological cancer, recurrent malignant left pleural effusion. Request made for therapeutic left thoracentesis. EXAM: ULTRASOUND GUIDED THERAPEUTIC LEFT THORACENTESIS MEDICATIONS: None COMPLICATIONS: None immediate. PROCEDURE: An ultrasound guided thoracentesis was thoroughly discussed with the patient and questions answered. The benefits, risks, alternatives and complications were also discussed. The patient understands and wishes to proceed with the procedure. Written consent was obtained. Ultrasound was performed to localize and mark an adequate pocket of fluid in the left chest. The area was then prepped and draped in the normal sterile fashion. 1% Lidocaine was used for local anesthesia. Under ultrasound guidance a 6 Fr Safe-T-Centesis catheter was introduced. Thoracentesis was performed.  The catheter was removed and a dressing applied. FINDINGS: A total of approximately 1.2 liters of hazy, brown fluid was removed. Due to patient chest discomfort only the above amount of fluid was removed today. IMPRESSION: Successful ultrasound guided therapeutic left thoracentesis yielding 1.2 liters of pleural fluid. Read by: Rowe Robert, PA-C Electronically Signed   By: Aletta Edouard M.D.   On: 03/02/2018 15:35    Labs:  CBC: Recent Labs    03/15/18 1225 03/16/18 0348 03/17/18 0412 03/18/18 1402 03/20/18 0823  WBC 20.1* 21.1* 20.3*  --  23.8*  HGB 11.9* 11.3* 10.6* 11.8* 13.0  HCT 39.0 37.1 34.5* 37.5 41.8  PLT 644* 532* 577*  --  595*    COAGS: Recent Labs    11/26/17 1921 01/29/18 1303 02/18/18 0746  INR 1.13 1.10 1.25    BMP: Recent Labs    03/17/18 0412 03/19/18 0429 03/20/18 0823 03/21/18 1028  NA 135 132* 135 134*  K 4.8 4.9 5.8* 5.2*  CL 106 106 106 103  CO2 22 20* 20* 23  GLUCOSE 113* 104* 124* 74  BUN 56* 40* 44*  44*  CALCIUM 7.9* 7.8* 8.4* 8.0*  CREATININE 1.66* 1.21* 1.54* 1.44*  GFRNONAA 32* 47* 35* 38*  GFRAA 37* 54* 41* 44*    LIVER FUNCTION TESTS: Recent Labs    02/17/18 0315 02/18/18 0521 03/01/18 1302 03/13/18 0022  BILITOT 0.3 0.5 0.4 0.5  AST 19 22 38 45*  ALT 13 12 43 40  ALKPHOS 113 68 400* 679*  PROT 6.0* 5.8* 8.1 7.2  ALBUMIN 2.6* 3.6 2.5* 1.8*    TUMOR MARKERS: No results for input(s): AFPTM, CEA, CA199, CHROMGRNA in the last 8760 hours.  Assessment and Plan: Recurrent left pleural effusion, shortness of breath Patient with metastatic ovarian cancer requiring both frequent paracentesis and thoracentesis.   Shortness of breath is her largest complaint.  Pleurx catheter has been requested.  Patient will need supplies ordered through home health by ordering service.  Discussed Pleurx with patient and son (over the phone). They are in agreement and would like to proceed with placement.  She is not on thinners. INR  ordered.  NPO tomorrow AM.   Risks and benefits discussed with the patient including, but not limited to bleeding, infection, or injury.  Thank you for this interesting consult.  I greatly enjoyed meeting Tracy Flynn and look forward to participating in their care.  A copy of this report was sent to the requesting provider on this date.  Electronically Signed: Docia Barrier, PA 03/21/2018, 2:48 PM   I spent a total of 40 Minutes    in face to face in clinical consultation, greater than 50% of which was counseling/coordinating care for recurrent pleural effusion.

## 2018-03-21 NOTE — Progress Notes (Signed)
PROGRESS NOTE    Tracy Flynn  RDE:[id number] DOB: 1955/11/25 DOA: 02/25/2018 PCP: Antony Blackbird, MD    Brief Narrative: Tracy Flynn is a 62 y.o. femalewith medical history significant ofmetastatic ovarian cancer with recurrent malignant ascites and L sided malignant pleural effusion. She presented secondary to shortness of breath with recurrent pleural effusion. S/p thoracentesis and paracentesis.  Assessment & Plan:   Principal Problem:   Malignant pleural effusion: Left Active Problems:   Ovarian tumor   Recurrent pleural effusion on left   Cancer, metastatic (HCC)   Ascites  Recurrent malignant effusions/ Ascites Sec to met ovarian cancer. S/p thoracentesis and paracentesis. Repeat paracentesis on 9/28 for increasing abdominal distention and 4.4 lit of fluids taken out. Stopped the IV fluids. Pt continues to have abdominal pain, but family is resistant to any pain medications other than tramadol and tylenol. Ibuprofen was added today.  PT eval recommending SNF.    Dyspnea:  Pt reported some dyspnea in the last 24 hours, CXR shows unchanged left effusion.  IR consulted for thoracentesis and possible left pleurex catheter placement.  She is on RA and not in distress at this time.   AKI:  Creatinine started to increase to 1.54.  Fluid overloaded with pleural effusions, bilateral pedal edema and ascites. Pending BMP today.     Hyperkalemia: kayexalate given. 12 lead EKG does not show any peaked T waves   Leukocytosis: suspect reactive. She is afebrile and ascitic fluid sent for analysis.    Acute anemia of blood loss Possibly from vaginal bleed. H&H is around 13.   Tachycardia:  From pain?    GERD: Improved with protonix.   DVT prophylaxis: scd's Code Status: full code.  Family Communication: discussed with son at bedside.  Disposition Plan: SNF when bed available.    Consultants:   IR  Palliative care.    Procedures: US PARACENTESIS. Thoracentesis.      Antimicrobials: none    Subjective: Persistent nausea, but she did not ask for any nausea meds last night or this am.  She requested some meds to relax without making her drowsy. We will order atarax to see if it will help her.  She reports persistent abdominal pain despite paracentesis.   Objective: Vitals:   03/20/18 1020 03/20/18 1028 03/20/18 2138 03/21/18 0614  BP: 119/78 115/76 113/81 111/83  Pulse:   (!) 113 (!) 111  Resp:   16 16  Temp:   98.6 F (37 C) 97.8 F (36.6 C)  TempSrc:   Oral Oral  SpO2:   97% 92%  Weight:      Height:        Intake/Output Summary (Last 24 hours) at 03/21/2018 1213 Last data filed at 03/21/2018 0700 Gross per 24 hour  Intake 2183.9 ml  Output -  Net 2183.9 ml   Filed Weights   03/17/2018 2107  Weight: 66.2 kg    Examination:  General exam: frail lady cachetic looking in mild distress from pain.  Respiratory system: diminished more on the left than right today. No wheezing or rhonchi.  Cardiovascular system: S1 & S2 heard, tachycardic. Leg edema present.  Gastrointestinal system: Abdomen is soft mild gen tenderness, bowel sounds good. .  Central nervous system: Alert and oriented. Non focal. Extremities:pedal edema present.  Skin: No rashes, lesions or ulcers Psychiatry: flat affect.     Data Reviewed: I have personally reviewed following labs and imaging studies  CBC: Recent Labs  Lab 03/15/18 1225 03/16/18 0348 03/17/18 0412 03/18/18 1402  03/20/18 0823  WBC 20.1* 21.1* 20.3*  --  23.8*  NEUTROABS  --   --   --   --  21.8*  HGB 11.9* 11.3* 10.6* 11.8* 13.0  HCT 39.0 37.1 34.5* 37.5 41.8  MCV 79.3 78.4 78.6  --  80.7  PLT 644* 532* 577*  --  269*   Basic Metabolic Panel: Recent Labs  Lab 03/15/18 1225 03/16/18 0348 03/16/18 1736 03/17/18 0412 03/19/18 0429 03/20/18 0823  NA 137 134*  --  135 132* 135  K 4.9 5.6* 5.2* 4.8 4.9 5.8*  CL 100 101  --  106 106 106  CO2 27 23  --  22 20* 20*  GLUCOSE 102* 81   --  113* 104* 124*  BUN 68* 61*  --  56* 40* 44*  CREATININE 2.25* 1.94*  --  1.66* 1.21* 1.54*  CALCIUM 8.7* 8.2*  --  7.9* 7.8* 8.4*   GFR: Estimated Creatinine Clearance: 39.6 mL/min (A) (by C-G formula based on SCr of 1.54 mg/dL (H)). Liver Function Tests: No results for input(s): AST, ALT, ALKPHOS, BILITOT, PROT, ALBUMIN in the last 168 hours. No results for input(s): LIPASE, AMYLASE in the last 168 hours. No results for input(s): AMMONIA in the last 168 hours. Coagulation Profile: No results for input(s): INR, PROTIME in the last 168 hours. Cardiac Enzymes: No results for input(s): CKTOTAL, CKMB, CKMBINDEX, TROPONINI in the last 168 hours. BNP (last 3 results) No results for input(s): PROBNP in the last 8760 hours. HbA1C: No results for input(s): HGBA1C in the last 72 hours. CBG: No results for input(s): GLUCAP in the last 168 hours. Lipid Profile: No results for input(s): CHOL, HDL, LDLCALC, TRIG, CHOLHDL, LDLDIRECT in the last 72 hours. Thyroid Function Tests: No results for input(s): TSH, T4TOTAL, FREET4, T3FREE, THYROIDAB in the last 72 hours. Anemia Panel: No results for input(s): VITAMINB12, FOLATE, FERRITIN, TIBC, IRON, RETICCTPCT in the last 72 hours. Sepsis Labs: No results for input(s): PROCALCITON, LATICACIDVEN in the last 168 hours.  No results found for this or any previous visit (from the past 240 hour(s)).       Radiology Studies: US Paracentesis  Result Date: 03/20/2018 INDICATION: Patient with malignant ascites. Request is made for therapeutic paracentesis. EXAM: ULTRASOUND GUIDED THERAPEUTIC PARACENTESIS MEDICATIONS: 10 mL 1% lidocaine COMPLICATIONS: None immediate. PROCEDURE: Informed written consent was obtained from the patient after a discussion of the risks, benefits and alternatives to treatment. A timeout was performed prior to the initiation of the procedure. Initial ultrasound scanning demonstrates a moderate amount of ascites within the right  lower abdominal quadrant. The right lower abdomen was prepped and draped in the usual sterile fashion. 1% lidocaine with epinephrine was used for local anesthesia. Following this, a 19 gauge, 7-cm, Yueh catheter was introduced. An ultrasound image was saved for documentation purposes. The paracentesis was performed. The catheter was removed and a dressing was applied. The patient tolerated the procedure well without immediate post procedural complication. FINDINGS: A total of approximately 4.4 liters of chylous fluid was removed. IMPRESSION: Successful ultrasound-guided paracentesis yielding 4.4 liters of peritoneal fluid. Read by: Brynda Greathouse PA-C Electronically Signed   By: Marybelle Killings M.D.   On: 03/20/2018 11:05   Dg Chest Port 1 View  Result Date: 03/19/2018 CLINICAL DATA:  Increased shortness of breath. EXAM: PORTABLE CHEST 1 VIEW COMPARISON:  March 13, 2018 FINDINGS: Large effusion with underlying opacification of the left chest, stable. Poorly aerated left lung. The right lung remains clear. There is volume  loss on the left with shift of the heart mediastinum to the left. No pneumothorax. IMPRESSION: No change in the large left pleural effusion, underlying opacity, and left-sided volume loss. Electronically Signed   By: Dorise Bullion III M.D   On: 03/19/2018 20:42        Scheduled Meds: . docusate sodium  100 mg Oral BID  . feeding supplement (ENSURE ENLIVE)  237 mL Oral BID BM  . ferrous sulfate  325 mg Oral BID WC  . folic acid  1 mg Oral Daily  . nystatin  5 mL Oral QID  . pantoprazole  40 mg Oral Daily   Continuous Infusions:    LOS: 6 days    Time spent: 25 min    Hosie Poisson, MD [facility] Pager [phone] If 7PM-7AM, please contact night-coverage [email or web address] Password [facility] 03/21/2018, 12:13 PM

## 2018-03-22 ENCOUNTER — Encounter (HOSPITAL_COMMUNITY): Payer: Self-pay | Admitting: Interventional Radiology

## 2018-03-22 ENCOUNTER — Inpatient Hospital Stay (HOSPITAL_COMMUNITY): Payer: Self-pay

## 2018-03-22 HISTORY — PX: IR GUIDED DRAIN W CATHETER PLACEMENT: IMG719

## 2018-03-22 LAB — PROTIME-INR
INR: 1.15
Prothrombin Time: 14.6 seconds (ref 11.4–15.2)

## 2018-03-22 MED ORDER — MIDAZOLAM HCL 2 MG/2ML IJ SOLN
INTRAMUSCULAR | Status: AC | PRN
  Administered 2018-03-22: 1 mg via INTRAVENOUS

## 2018-03-22 MED ORDER — LIDOCAINE HCL (PF) 1 % IJ SOLN
INTRAMUSCULAR | Status: AC | PRN
  Administered 2018-03-22: 10 mL

## 2018-03-22 MED ORDER — LIDOCAINE HCL (PF) 1 % IJ SOLN
INTRAMUSCULAR | Status: AC
  Filled 2018-03-22: qty 30

## 2018-03-22 MED ORDER — FENTANYL CITRATE (PF) 100 MCG/2ML IJ SOLN
INTRAMUSCULAR | Status: AC
  Filled 2018-03-22: qty 2

## 2018-03-22 MED ORDER — MIDAZOLAM HCL 2 MG/2ML IJ SOLN
INTRAMUSCULAR | Status: AC
  Filled 2018-03-22: qty 2

## 2018-03-22 MED ORDER — CLINDAMYCIN PHOSPHATE 900 MG/50ML IV SOLN
900.0000 mg | Freq: Once | INTRAVENOUS | Status: AC
  Administered 2018-03-22: 900 mg via INTRAVENOUS

## 2018-03-22 MED ORDER — CLINDAMYCIN PHOSPHATE 900 MG/50ML IV SOLN
INTRAVENOUS | Status: AC
  Administered 2018-03-22: 900 mg via INTRAVENOUS
  Filled 2018-03-22: qty 50

## 2018-03-22 MED ORDER — FENTANYL CITRATE (PF) 100 MCG/2ML IJ SOLN
INTRAMUSCULAR | Status: AC | PRN
  Administered 2018-03-22: 50 ug via INTRAVENOUS

## 2018-03-22 NOTE — Progress Notes (Addendum)
Pt VSs stable after Pleural catheter placed. Pt dressing dry and intact. Arouses easily to verbal and tactile . Called IR to clarify care of pleural catheter and spoke with MD; pt may require additional draining if becomes symptomatic ex. Short of breath. Care is usually every other day to 3 times a week. Will continue to monitor. SRP, RN

## 2018-03-22 NOTE — Progress Notes (Signed)
PT Cancellation Note  Patient Details Name: Weslyn Holsonback MRN: [id number] DOB: Aug 10, 1955   Cancelled Treatment:    Reason Eval/Treat Not Completed: Patient at procedure or test/unavailable   Bilbo Carcamo,KATHrine E 03/22/2018, 11:00 AM Carmelia Bake, PT, DPT Acute Rehabilitation Services Office: [phone] Pager: [phone]

## 2018-03-22 NOTE — Progress Notes (Signed)
PROGRESS NOTE    Tracy Flynn  HRC:[id number] DOB: 1956/04/15 DOA: 02/26/2018 PCP: Antony Blackbird, MD    Brief Narrative: Tracy Flynn is a 62 y.o. femalewith medical history significant ofmetastatic ovarian cancer with recurrent malignant ascites and L sided malignant pleural effusion. She presented secondary to shortness of breath with recurrent pleural effusion. S/p thoracentesis and paracentesis.  Assessment & Plan:   Principal Problem:   Malignant pleural effusion: Left Active Problems:   Ovarian tumor   Recurrent pleural effusion on left   Cancer, metastatic (HCC)   Ascites  Recurrent malignant effusions/ Ascites Sec to met ovarian cancer. S/p thoracentesis and paracentesis. Repeat paracentesis on 9/28 for increasing abdominal distention and 4.4 lit of fluids taken out. Stopped the IV fluids. Pt continues to have abdominal pain, but family is resistant to any pain medications other than tramadol and tylenol. Ibuprofen was added today.  She has refused any kind of treatment for the metastatic ovarian cancer and also declined palliative services on multiple occasions.  PT eval recommending SNF. Currently waiting for a bed at SNF for discharge.     Dyspnea:  Pt reported some dyspnea in the last 24 hours, CXR shows unchanged left effusion.  IR consulted for thoracentesis and possible left pleurex catheter placement. She underwent the procedure today without any complications.  She is on RA and not in distress at this time.   AKI:  Creatinine started to increase to 1.44.  Fluid overloaded with pleural effusions, bilateral pedal edema and ascites.  Recheck BMP in am.     Hyperkalemia: kayexalate given. 12 lead EKG does not show any peaked T waves Repeat BMP in am.   Leukocytosis: suspect reactive. She is afebrile and ascitic fluid sent for analysis.    Acute anemia of blood loss Possibly from vaginal bleed. H&H is around 13.   Tachycardia:  From pain?     GERD: Improved with protonix.   DVT prophylaxis: scd's Code Status: full code.  Family Communication: none at bedside.  Disposition Plan: SNF when bed available.    Consultants:   IR  Palliative care.    Procedures: US PARACENTESIS. Thoracentesis.    Antimicrobials: none    Subjective: No change, she reports pain in the abdomen and some dyspnea.   Objective: Vitals:   03/22/18 1235 03/22/18 1240 03/22/18 1318 03/22/18 1425  BP: 106/80 120/85 110/78 117/80  Pulse: (!) 116 (!) 115 (!) 109 (!) 110  Resp: (!) 8 (!) 8 14   Temp:   97.7 F (36.5 C) 98.5 F (36.9 C)  TempSrc:   Oral Oral  SpO2: 99% 99% 100% 100%  Weight:      Height:        Intake/Output Summary (Last 24 hours) at 03/22/2018 1451 Last data filed at 03/22/2018 0900 Gross per 24 hour  Intake 0 ml  Output -  Net 0 ml   Filed Weights   03/03/2018 2107  Weight: 66.2 kg    Examination:  General exam: frail lady cachetic looking in mild distress from pain.  Respiratory system: diminished air entry bilateral. Left pleurex in place.  Cardiovascular system: S1 & S2 heard, tachycardic. Leg edema present.  Gastrointestinal system: Abdomen is soft  Tenderness generalized.  Central nervous system: Alert and oriented. Non focal. Extremities:pedal edema present.  Skin: No rashes, lesions or ulcers Psychiatry: flat affect.     Data Reviewed: I have personally reviewed following labs and imaging studies  CBC: Recent Labs  Lab 03/16/18 0348 03/17/18 0412 03/18/18  1402 03/20/18 0823  WBC 21.1* 20.3*  --  23.8*  NEUTROABS  --   --   --  21.8*  HGB 11.3* 10.6* 11.8* 13.0  HCT 37.1 34.5* 37.5 41.8  MCV 78.4 78.6  --  80.7  PLT 532* 577*  --  831*   Basic Metabolic Panel: Recent Labs  Lab 03/16/18 0348 03/16/18 1736 03/17/18 0412 03/19/18 0429 03/20/18 0823 03/21/18 1028  NA 134*  --  135 132* 135 134*  K 5.6* 5.2* 4.8 4.9 5.8* 5.2*  CL 101  --  106 106 106 103  CO2 23  --  22 20* 20*  23  GLUCOSE 81  --  113* 104* 124* 74  BUN 61*  --  56* 40* 44* 44*  CREATININE 1.94*  --  1.66* 1.21* 1.54* 1.44*  CALCIUM 8.2*  --  7.9* 7.8* 8.4* 8.0*   GFR: Estimated Creatinine Clearance: 42.3 mL/min (A) (by C-G formula based on SCr of 1.44 mg/dL (H)). Liver Function Tests: No results for input(s): AST, ALT, ALKPHOS, BILITOT, PROT, ALBUMIN in the last 168 hours. No results for input(s): LIPASE, AMYLASE in the last 168 hours. No results for input(s): AMMONIA in the last 168 hours. Coagulation Profile: Recent Labs  Lab 03/22/18 0424  INR 1.15   Cardiac Enzymes: No results for input(s): CKTOTAL, CKMB, CKMBINDEX, TROPONINI in the last 168 hours. BNP (last 3 results) No results for input(s): PROBNP in the last 8760 hours. HbA1C: No results for input(s): HGBA1C in the last 72 hours. CBG: No results for input(s): GLUCAP in the last 168 hours. Lipid Profile: No results for input(s): CHOL, HDL, LDLCALC, TRIG, CHOLHDL, LDLDIRECT in the last 72 hours. Thyroid Function Tests: No results for input(s): TSH, T4TOTAL, FREET4, T3FREE, THYROIDAB in the last 72 hours. Anemia Panel: Recent Labs    03/21/18 1240  TIBC NOT CALCULATED  IRON 11*   Sepsis Labs: No results for input(s): PROCALCITON, LATICACIDVEN in the last 168 hours.  No results found for this or any previous visit (from the past 240 hour(s)).       Radiology Studies: No results found.      Scheduled Meds: . docusate sodium  100 mg Oral BID  . feeding supplement (ENSURE ENLIVE)  237 mL Oral BID BM  . fentaNYL      . ferrous sulfate  325 mg Oral BID WC  . folic acid  1 mg Oral Daily  . lidocaine (PF)      . midazolam      . nystatin  5 mL Oral QID  . pantoprazole  40 mg Oral Daily   Continuous Infusions:    LOS: 7 days    Time spent: 25 min    Hosie Poisson, MD [facility] Pager [phone] If 7PM-7AM, please contact night-coverage [email or web address] Password [email or web address] 03/22/2018, 2:51 PM

## 2018-03-22 NOTE — Progress Notes (Signed)
Pt mental status has declined since last PM. She does not know what day of week it is or if it is am or pm. She asked why her son has not visited and he had just left after being here several hours with her sister and brother in law. Refuses pain med after she asked for it. She is upset that she is confused about day and time. Reassured pt, repositioned in bed. Will continue to observe and assist as needed. Hoyle Barr, RN

## 2018-03-22 NOTE — Procedures (Signed)
  Procedure: LEFT pleurX pleural drain placed,  L thora 988ml EBL:   minimal Complications:  none immediate  See full dictation in [facility].  Dillard Cannon MD Main # [phone] Pager  [phone]

## 2018-03-23 ENCOUNTER — Inpatient Hospital Stay (HOSPITAL_COMMUNITY): Payer: Self-pay

## 2018-03-23 DIAGNOSIS — C569 Malignant neoplasm of unspecified ovary: Principal | ICD-10-CM

## 2018-03-23 DIAGNOSIS — N179 Acute kidney failure, unspecified: Secondary | ICD-10-CM

## 2018-03-23 LAB — CBC WITH DIFFERENTIAL/PLATELET
Basophils Absolute: 0 10*3/uL (ref 0.0–0.1)
Basophils Relative: 0 %
Eosinophils Absolute: 0 10*3/uL (ref 0.0–0.7)
Eosinophils Relative: 0 %
HCT: 40.7 % (ref 36.0–46.0)
HEMOGLOBIN: 12.7 g/dL (ref 12.0–15.0)
LYMPHS PCT: 1 %
Lymphs Abs: 0.4 10*3/uL — ABNORMAL LOW (ref 0.7–4.0)
MCH: 25 pg — AB (ref 26.0–34.0)
MCHC: 31.2 g/dL (ref 30.0–36.0)
MCV: 80 fL (ref 78.0–100.0)
MONOS PCT: 4 %
Monocytes Absolute: 1.2 10*3/uL — ABNORMAL HIGH (ref 0.1–1.0)
NEUTROS ABS: 33 10*3/uL — AB (ref 1.7–7.7)
NEUTROS PCT: 95 %
Platelets: 637 10*3/uL — ABNORMAL HIGH (ref 150–400)
RBC: 5.09 MIL/uL (ref 3.87–5.11)
RDW: 17.5 % — ABNORMAL HIGH (ref 11.5–15.5)
WBC: 34.6 10*3/uL — ABNORMAL HIGH (ref 4.0–10.5)

## 2018-03-23 LAB — COMPREHENSIVE METABOLIC PANEL
ALK PHOS: 1050 U/L — AB (ref 38–126)
ALT: 47 U/L — AB (ref 0–44)
AST: 57 U/L — ABNORMAL HIGH (ref 15–41)
Albumin: 1.4 g/dL — ABNORMAL LOW (ref 3.5–5.0)
Anion gap: 11 (ref 5–15)
BUN: 57 mg/dL — AB (ref 8–23)
CO2: 18 mmol/L — AB (ref 22–32)
Calcium: 8.5 mg/dL — ABNORMAL LOW (ref 8.9–10.3)
Chloride: 102 mmol/L (ref 98–111)
Creatinine, Ser: 2.46 mg/dL — ABNORMAL HIGH (ref 0.44–1.00)
GFR, EST AFRICAN AMERICAN: 23 mL/min — AB (ref >60)
GFR, EST NON AFRICAN AMERICAN: 20 mL/min — AB (ref >60)
GLUCOSE: 89 mg/dL (ref 70–99)
Potassium: 5.9 mmol/L — ABNORMAL HIGH (ref 3.5–5.1)
Sodium: 131 mmol/L — ABNORMAL LOW (ref 135–145)
Total Bilirubin: 0.8 mg/dL (ref 0.3–1.2)
Total Protein: 6.2 g/dL — ABNORMAL LOW (ref 6.5–8.1)

## 2018-03-23 MED ORDER — SODIUM CHLORIDE 0.9 % IV SOLN
INTRAVENOUS | Status: DC
  Administered 2018-03-23 – 2018-03-24 (×2): via INTRAVENOUS

## 2018-03-23 MED ORDER — SODIUM POLYSTYRENE SULFONATE 15 GM/60ML PO SUSP
30.0000 g | Freq: Once | ORAL | Status: AC
  Administered 2018-03-23: 30 g via ORAL
  Filled 2018-03-23: qty 120

## 2018-03-23 MED ORDER — SODIUM CHLORIDE 0.9 % IV SOLN
1.0000 g | Freq: Once | INTRAVENOUS | Status: AC
  Administered 2018-03-23: 1 g via INTRAVENOUS
  Filled 2018-03-23: qty 10

## 2018-03-23 NOTE — Progress Notes (Signed)
Referring Physician(s): Dr. Karleen Hampshire  Supervising Physician: Marybelle Killings  Patient Status:  [facility] - In-pt  Chief Complaint: Recurrent left pleural effusion  Subjective: Resting comfortably. Alert and oriented during my visit today.  Appears weak.  Allergies: Codeine and Penicillins  Medications: Prior to Admission medications   Medication Sig Start Date End Date Taking? Authorizing Provider  cetirizine (ZYRTEC) 10 MG tablet Take 1 tablet (10 mg total) by mouth daily. Patient taking differently: Take 10 mg by mouth daily as needed for allergies.  12/10/17  Yes Ena Dawley, Tiffany S, PA-C  docusate sodium (COLACE) 100 MG capsule Take 1 capsule (100 mg total) by mouth 2 (two) times daily. 02/20/18  Yes Lavina Hamman, MD  ferrous sulfate 325 (65 FE) MG tablet Take 1 tablet (325 mg total) by mouth 2 (two) times daily with a meal. 02/25/18  Yes Fulp, Cammie, MD  folic acid (FOLVITE) 1 MG tablet Take 1 tablet (1 mg total) by mouth daily. 01/17/18  Yes Georgette Shell, MD  lactulose (CHRONULAC) 10 GM/15ML solution Take 45 mLs (30 g total) by mouth daily. 02/21/18  Yes Lavina Hamman, MD  potassium chloride (K-DUR) 10 MEQ tablet Take 2 tablets (20 mEq total) by mouth daily. MUST MAKE APPT FOR FURTHER REFILLS 03/10/18  Yes Fulp, Cammie, MD  torsemide (DEMADEX) 20 MG tablet Take 20 mg by mouth daily, can take 20 mg extra for weight gain of 3 lbs in 1 day. MUST MAKE APPT FOR FURTHER REFILLS 03/10/18  Yes Fulp, Cammie, MD  traMADol (ULTRAM) 50 MG tablet Take 100 mg by mouth every 6 (six) hours as needed for moderate pain.    Yes [provider]     Vital Signs: BP 101/76 (BP Location: Right Arm)   Pulse (!) 115   Temp 97.7 F (36.5 C) (Oral)   Resp 18   Ht 5\' 11"  (1.803 m)   Wt 146 lb (66.2 kg)   SpO2 99%   BMI 20.36 kg/m   Physical Exam  NAD, alert, resting comfortably.  Chest:  Insertion site appears intact.  Dressing clean and dry.    Imaging: Ir Lenise Arena W Catheter  Placement  Result Date: 03/22/2018 CLINICAL DATA:  Malignant recurrent large left pleural effusion. EXAM: INSERTION OF TUNNELED PLEURAL DRAINAGE CATHETER ANESTHESIA/SEDATION: Intravenous Fentanyl and Versed were administered as conscious sedation during continuous monitoring of the patient's level of consciousness and physiological / cardiorespiratory status by the radiology RN, with a total moderate sedation time of 15 minutes. MEDICATIONS: As antibiotic prophylaxis, clindamycin 900 mg was ordered pre-procedure and administered intravenously within one hour of incision. FLUOROSCOPY TIME:  Less than 0.1 minute; 5  uGym2 DAP PROCEDURE: The procedure, risks, benefits, and alternatives were explained to the patient. Questions regarding the procedure were encouraged and answered. The patient understands and consents to the procedure. Survey ultrasound of the left chest was performed and an appropriate skin entry site was determined and marked. The left chest wall was prepped with Betadine in a sterile fashion, and a sterile drape was applied covering the operative field. A sterile gown and sterile gloves were used for the procedure. Local anesthesia was provided with 1% Lidocaine. Ultrasound image documentation was performed. Fluoroscopy during the procedure and fluoroscopic spot radiograph confirms appropriate catheter position. After creating a small skin incision, a 19 gauge needle was advanced into the pleural cavity under ultrasound guidance. A guide wire was then advanced under fluoroscopy into the pleural space. Pleural access was dilated serially and  a 16-French peel-away sheath placed. A 16 French tunneled PleurX catheter was placed. This was tunneled from an incision 5 cm below the pleural access to the access site. The catheter was advanced through the peel-away sheath. The sheath was then removed. Final catheter positioning was confirmed with a fluoroscopic spot image. The access incision was closed with  Dermabond was applied to the incision. A Prolene retention suture was applied at the catheter exit site. Large volume thoracentesis was performed through the new catheter utilizing gravity drainage bag. COMPLICATIONS: None. FINDINGS: The catheter was placed via the left lateral chest wall. Catheter course is towards the apex. Approximately 900 mL of pleural fluid was able to be removed after catheter placement. The patient will be brought back in 10 to 14days to remove the temporary exit site retention suture after appropriate in-growth around the subcutaneous cuff of the catheter. IMPRESSION: Placement of permanent, tunneled left pleural drainage catheter via lateral approach. 900 mL of pleural fluid was removed today after catheter placement. Electronically Signed   By: Lucrezia Europe M.D.   On: 03/22/2018 15:16   US Paracentesis  Result Date: 03/20/2018 INDICATION: Patient with malignant ascites. Request is made for therapeutic paracentesis. EXAM: ULTRASOUND GUIDED THERAPEUTIC PARACENTESIS MEDICATIONS: 10 mL 1% lidocaine COMPLICATIONS: None immediate. PROCEDURE: Informed written consent was obtained from the patient after a discussion of the risks, benefits and alternatives to treatment. A timeout was performed prior to the initiation of the procedure. Initial ultrasound scanning demonstrates a moderate amount of ascites within the right lower abdominal quadrant. The right lower abdomen was prepped and draped in the usual sterile fashion. 1% lidocaine with epinephrine was used for local anesthesia. Following this, a 19 gauge, 7-cm, Yueh catheter was introduced. An ultrasound image was saved for documentation purposes. The paracentesis was performed. The catheter was removed and a dressing was applied. The patient tolerated the procedure well without immediate post procedural complication. FINDINGS: A total of approximately 4.4 liters of chylous fluid was removed. IMPRESSION: Successful ultrasound-guided  paracentesis yielding 4.4 liters of peritoneal fluid. Read by: Brynda Greathouse PA-C Electronically Signed   By: Marybelle Killings M.D.   On: 03/20/2018 11:05   Dg Chest Port 1 View  Result Date: 03/19/2018 CLINICAL DATA:  Increased shortness of breath. EXAM: PORTABLE CHEST 1 VIEW COMPARISON:  March 13, 2018 FINDINGS: Large effusion with underlying opacification of the left chest, stable. Poorly aerated left lung. The right lung remains clear. There is volume loss on the left with shift of the heart mediastinum to the left. No pneumothorax. IMPRESSION: No change in the large left pleural effusion, underlying opacity, and left-sided volume loss. Electronically Signed   By: Dorise Bullion III M.D   On: 03/19/2018 20:42    Labs:  CBC: Recent Labs    03/16/18 0348 03/17/18 0412 03/18/18 1402 03/20/18 0823 03/23/18 1048  WBC 21.1* 20.3*  --  23.8* 34.6*  HGB 11.3* 10.6* 11.8* 13.0 12.7  HCT 37.1 34.5* 37.5 41.8 40.7  PLT 532* 577*  --  595* 637*    COAGS: Recent Labs    11/26/17 1921 01/29/18 1303 02/18/18 0746 03/22/18 0424  INR 1.13 1.10 1.25 1.15    BMP: Recent Labs    03/19/18 0429 03/20/18 0823 03/21/18 1028 03/23/18 1208  NA 132* 135 134* 131*  K 4.9 5.8* 5.2* 5.9*  CL 106 106 103 102  CO2 20* 20* 23 18*  GLUCOSE 104* 124* 74 89  BUN 40* 44* 44* 57*  CALCIUM 7.8* 8.4*  8.0* 8.5*  CREATININE 1.21* 1.54* 1.44* 2.46*  GFRNONAA 47* 35* 38* 20*  GFRAA 54* 41* 44* 23*    LIVER FUNCTION TESTS: Recent Labs    02/18/18 0521 03/01/18 1302 03/13/18 0022 03/23/18 1208  BILITOT 0.5 0.4 0.5 0.8  AST 22 38 45* 57*  ALT 12 43 40 47*  ALKPHOS 68 400* 679* 1,050*  PROT 5.8* 8.1 7.2 6.2*  ALBUMIN 3.6 2.5* 1.8* 1.4*    Assessment and Plan: Recurrent left pleural effusion s/p Pleurx catheter placement 03/22/18 by Dr. Vernard Gambles. Patient with confusion overnight, but alert and oriented upon my visit today.  Note worsening labs today.  Did not remove/replace dressing as site  is non-tender and intact.  Clear, yellow fluid in tubing.  Plan is to drain as needed for shortness of breath.  Supplies can be obtained from [employer] as needed.  Patient will need home health at discharge for supplies.   Electronically Signed: Docia Barrier, PA 03/23/2018, 2:57 PM   I spent a total of 15 Minutes at the the patient's bedside AND on the patient's hospital floor or unit, greater than 50% of which was counseling/coordinating care for recurrent left pleural effusion.

## 2018-03-23 NOTE — Progress Notes (Signed)
Per previous shift, pt has not urinated today. Bladder scan x2 performed at 2114 revealing 0 mL or urine. Previous reading at 1700 was 173 mL. Pt has not urinated since previous scan. NS running at 100 mL/hr. NP on call, Baltazar Najjar, updated. Will continue to monitor.

## 2018-03-23 NOTE — Progress Notes (Signed)
PROGRESS NOTE    Tracy Flynn  HQI:[id number] DOB: 20-Oct-1955 DOA: 02/24/2018 PCP: Antony Blackbird, MD    Brief Narrative: Tracy Flynn is a 62 y.o. femalewith medical history significant ofmetastatic ovarian cancer with recurrent malignant ascites and L sided malignant pleural effusion. She presented secondary to shortness of breath with recurrent pleural effusion. S/p thoracentesis and paracentesis.  Assessment & Plan:   Principal Problem:   Malignant pleural effusion: Left Active Problems:   Ovarian tumor   Recurrent pleural effusion on left   Cancer, metastatic (HCC)   Ascites  Recurrent malignant effusions/ Ascites Sec to met ovarian cancer. S/p thoracentesis and paracentesis. Repeat paracentesis on 9/28 for increasing abdominal distention and 4.4 lit of fluids taken out. Stopped the IV fluids. Pt continues to have abdominal pain, but family is resistant to any pain medications other than tramadol and tylenol. Ibuprofen had to be held due to acute renal failure. Patient and family resistant to narcotic pain medication.  She has refused any kind of treatment for the metastatic ovarian cancer and also declined palliative services on multiple occasions.  PT eval recommending SNF. Currently waiting for a bed at SNF for discharge.     Dyspnea:  Pt has been complaining of dyspnea since 2 days,  CXR shows unchanged left effusion.  IR consulted for thoracentesis and possible left pleurex catheter placement. She underwent the procedure on 9/30 without any complications.  Today on exam, pt continues to be sob, requiring oxygen despite removing 900 ml of pleural fluid and placement of pleurex catheter.  Repeat CXR showed moderate left sided pneumothorax. Requested IR to adjust the catheter.    AKI:  Initial improvement with IV fluids, but she is back to ARF with creatinine of 2.45 with hyperkalemia of 5.9 . One dose of kayexalate ordered and one dose of calcium gluconate ordered.      Hyperkalemia: kayexalate given. 12 lead EKG does not show any peaked T waves, , put telemetry back on. One dose of calcium gluconate.  Repeat BMP in am.   Leukocytosis: suspect reactive. She is afebrile and ascitic fluid sent for analysis.    Acute anemia of blood loss Possibly from vaginal bleed. H&H is around 13.   Tachycardia:  From pain?    GERD: Improved with protonix.   DVT prophylaxis: scd's Code Status: full code.  Family Communication: With son at bedside Disposition Plan: SNF when bed available.    Consultants:   IR  Palliative care.    Procedures: US PARACENTESIS/ Thoracentesis.    Antimicrobials: none    Subjective: She continues to be sob, and little confused this morning.   Objective: Vitals:   03/22/18 1425 03/22/18 2151 03/23/18 0416 03/23/18 1313  BP: 117/80 (!) 95/44 100/73 101/76  Pulse: (!) 110 (!) 126 (!) 113 (!) 115  Resp:  _0 Temp: 98.5 F (36.9 C) 98 F (36.7 C) 98 F (36.7 C) 97.7 F (36.5 C)  TempSrc: Oral   Oral  SpO2: 100% 99% (!) 81% 99%  Weight:      Height:        Intake/Output Summary (Last 24 hours) at 03/23/2018 1604 Last data filed at 03/23/2018 1300 Gross per 24 hour  Intake 120 ml  Output -  Net 120 ml   Filed Weights   03/03/2018 2107  Weight: 66.2 kg    Examination:  General exam: frail lady cachetic looking in mild distress from pain.  Respiratory system: diminished air entry on the left, air entry  fair on the right, no wheezing or rhonchi.  Cardiovascular system: S1 & S2 heard, tachycardic. Leg edema present.  Gastrointestinal system: Abdomen is soft , distended.  Tenderness generalized.  Central nervous system: lethargic, slightly confused.  Extremities:pedal edema present.  Skin: No rashes, lesions or ulcers Psychiatry: flat affect.     Data Reviewed: I have personally reviewed following labs and imaging studies  CBC: Recent Labs  Lab 03/17/18 0412 03/18/18 1402 03/20/18 0823  03/23/18 1048  WBC 20.3*  --  23.8* 34.6*  NEUTROABS  --   --  21.8* 33.0*  HGB 10.6* 11.8* 13.0 12.7  HCT 34.5* 37.5 41.8 40.7  MCV 78.6  --  80.7 80.0  PLT 577*  --  595* 637*   Basic Metabolic Panel: Recent Labs  Lab 03/17/18 0412 03/19/18 0429 03/20/18 0823 03/21/18 1028 03/23/18 1208  NA 135 132* 135 134* 131*  K 4.8 4.9 5.8* 5.2* 5.9*  CL 106 106 106 103 102  CO2 22 20* 20* 23 18*  GLUCOSE 113* 104* 124* 74 89  BUN 56* 40* 44* 44* 57*  CREATININE 1.66* 1.21* 1.54* 1.44* 2.46*  CALCIUM 7.9* 7.8* 8.4* 8.0* 8.5*   GFR: Estimated Creatinine Clearance: 24.8 mL/min (A) (by C-G formula based on SCr of 2.46 mg/dL (H)). Liver Function Tests: Recent Labs  Lab 03/23/18 1208  AST 57*  ALT 47*  ALKPHOS 1,050*  BILITOT 0.8  PROT 6.2*  ALBUMIN 1.4*   No results for input(s): LIPASE, AMYLASE in the last 168 hours. No results for input(s): AMMONIA in the last 168 hours. Coagulation Profile: Recent Labs  Lab 03/22/18 0424  INR 1.15   Cardiac Enzymes: No results for input(s): CKTOTAL, CKMB, CKMBINDEX, TROPONINI in the last 168 hours. BNP (last 3 results) No results for input(s): PROBNP in the last 8760 hours. HbA1C: No results for input(s): HGBA1C in the last 72 hours. CBG: No results for input(s): GLUCAP in the last 168 hours. Lipid Profile: No results for input(s): CHOL, HDL, LDLCALC, TRIG, CHOLHDL, LDLDIRECT in the last 72 hours. Thyroid Function Tests: No results for input(s): TSH, T4TOTAL, FREET4, T3FREE, THYROIDAB in the last 72 hours. Anemia Panel: Recent Labs    03/21/18 1240  TIBC NOT CALCULATED  IRON 11*   Sepsis Labs: No results for input(s): PROCALCITON, LATICACIDVEN in the last 168 hours.  No results found for this or any previous visit (from the past 240 hour(s)).       Radiology Studies: Ir Guided Drain W Catheter Placement  Result Date: 03/22/2018 CLINICAL DATA:  Malignant recurrent large left pleural effusion. EXAM: INSERTION OF  TUNNELED PLEURAL DRAINAGE CATHETER ANESTHESIA/SEDATION: Intravenous Fentanyl and Versed were administered as conscious sedation during continuous monitoring of the patient's level of consciousness and physiological / cardiorespiratory status by the radiology RN, with a total moderate sedation time of 15 minutes. MEDICATIONS: As antibiotic prophylaxis, clindamycin 900 mg was ordered pre-procedure and administered intravenously within one hour of incision. FLUOROSCOPY TIME:  Less than 0.1 minute; 5  uGym2 DAP PROCEDURE: The procedure, risks, benefits, and alternatives were explained to the patient. Questions regarding the procedure were encouraged and answered. The patient understands and consents to the procedure. Survey ultrasound of the left chest was performed and an appropriate skin entry site was determined and marked. The left chest wall was prepped with Betadine in a sterile fashion, and a sterile drape was applied covering the operative field. A sterile gown and sterile gloves were used for the procedure. Local anesthesia was provided with   1% Lidocaine. Ultrasound image documentation was performed. Fluoroscopy during the procedure and fluoroscopic spot radiograph confirms appropriate catheter position. After creating a small skin incision, a 19 gauge needle was advanced into the pleural cavity under ultrasound guidance. A guide wire was then advanced under fluoroscopy into the pleural space. Pleural access was dilated serially and a 16-French peel-away sheath placed. A 16 French tunneled PleurX catheter was placed. This was tunneled from an incision 5 cm below the pleural access to the access site. The catheter was advanced through the peel-away sheath. The sheath was then removed. Final catheter positioning was confirmed with a fluoroscopic spot image. The access incision was closed with Dermabond was applied to the incision. A Prolene retention suture was applied at the catheter exit site. Large volume  thoracentesis was performed through the new catheter utilizing gravity drainage bag. COMPLICATIONS: None. FINDINGS: The catheter was placed via the left lateral chest wall. Catheter course is towards the apex. Approximately 900 mL of pleural fluid was able to be removed after catheter placement. The patient will be brought back in 10 to 14days to remove the temporary exit site retention suture after appropriate in-growth around the subcutaneous cuff of the catheter. IMPRESSION: Placement of permanent, tunneled left pleural drainage catheter via lateral approach. 900 mL of pleural fluid was removed today after catheter placement. Electronically Signed   By: Lucrezia Europe M.D.   On: 03/22/2018 15:16   Dg Chest Port 1 View  Result Date: 03/23/2018 CLINICAL DATA:  Follow-up pleural effusions. EXAM: PORTABLE CHEST 1 VIEW COMPARISON:  03/19/2018 FINDINGS: Patient is rotated to the left. Examination demonstrates interval placement of left pleural drainage catheter with tip over the left lung base. There is significant improvement in the previously noted left effusion with small residual left pleural effusion noted. Left basilar atelectasis. Evidence of moderate size left pneumothorax. Minimal prominence of the right perihilar markings. Remainder the exam is unchanged. IMPRESSION: Significant improvement in the left pleural effusion with small residual left pleural effusion and left basilar atelectasis post placement of left pleural drainage catheter. Evidence of moderate size left pneumothorax. These results were called by telephone at the time of interpretation on 03/23/2018 at 3:45 pm to patient's nurse, Joanne Chars, who verbally acknowledged these results and will notify covering physician. Electronically Signed   By: Marin Olp M.D.   On: 03/23/2018 15:46        Scheduled Meds: . docusate sodium  100 mg Oral BID  . feeding supplement (ENSURE ENLIVE)  237 mL Oral BID BM  . ferrous sulfate  325 mg Oral BID WC   . folic acid  1 mg Oral Daily  . nystatin  5 mL Oral QID  . pantoprazole  40 mg Oral Daily   Continuous Infusions: . sodium chloride 100 mL/hr at 03/23/18 1427     LOS: 8 days    Time spent: 25 min    Hosie Poisson, MD [facility] Pager [phone] If 7PM-7AM, please contact night-coverage [email or web address] Password [email or web address] 03/23/2018, 4:04 PM

## 2018-03-23 NOTE — Progress Notes (Signed)
PT Cancellation Note  Patient Details Name: Tracy Flynn MRN: [id number] DOB: [birth year]   Cancelled Treatment:    Reason Eval/Treat Not Completed: Medical issues which prohibited therapy. Spoke with RN who recommended PT be held today. Will check back another day.    Weston Anna, PT Acute Rehabilitation Services Pager: [phone] Office: [phone]

## 2018-03-23 NOTE — Progress Notes (Signed)
Dr. Karleen Hampshire notified about chest xray results called to me by Dr. Derrel Nip.  Will continue to monitor.

## 2018-03-23 NOTE — Plan of Care (Signed)
  Problem: Pain Managment: Goal: General experience of comfort will improve Outcome: Progressing   Problem: Safety: Goal: Ability to remain free from injury will improve Outcome: Progressing   

## 2018-03-23 NOTE — Progress Notes (Signed)
CXR obtained today by Dr. Karleen Hampshire shows moderate pneumothorax after Plerux catheter placement yesterday.   Reviewed imaging with Dr. Barbie Banner who notes patient had near full collapse of her lung due to trapping and fluid prior to Pleurx catheter placement. Likely incomplete re-expansion.  Recommends repeat CXR tomorrow morning if O2 sats and requirements stable.  Patient has been on 2L [place]/RA with 99-100% O2 sats.   Discussed with Dr. Karleen Hampshire.  Brynda Greathouse, MS RD PA-C 4:29 PM

## 2018-03-23 NOTE — Progress Notes (Signed)
Pt drank half of kayexalate.  Would not drink the rest. Explained the importance of this, but stated she just couldn't drink it.

## 2018-03-23 DEATH — deceased

## 2018-03-24 ENCOUNTER — Inpatient Hospital Stay (HOSPITAL_COMMUNITY): Payer: Self-pay

## 2018-03-24 ENCOUNTER — Inpatient Hospital Stay (HOSPITAL_COMMUNITY): Payer: Self-pay | Admitting: Anesthesiology

## 2018-03-24 DIAGNOSIS — R180 Malignant ascites: Secondary | ICD-10-CM

## 2018-03-24 DIAGNOSIS — Z9911 Dependence on respirator [ventilator] status: Secondary | ICD-10-CM

## 2018-03-24 DIAGNOSIS — D4959 Neoplasm of unspecified behavior of other genitourinary organ: Secondary | ICD-10-CM

## 2018-03-24 DIAGNOSIS — J90 Pleural effusion, not elsewhere classified: Secondary | ICD-10-CM

## 2018-03-24 DIAGNOSIS — C799 Secondary malignant neoplasm of unspecified site: Secondary | ICD-10-CM

## 2018-03-24 DIAGNOSIS — J910 Malignant pleural effusion: Secondary | ICD-10-CM

## 2018-03-24 LAB — CBC WITH DIFFERENTIAL/PLATELET
Basophils Absolute: 0 10*3/uL (ref 0.0–0.1)
Basophils Relative: 0 %
EOS ABS: 0 10*3/uL (ref 0.0–0.7)
EOS PCT: 0 %
HEMATOCRIT: 40.5 % (ref 36.0–46.0)
Hemoglobin: 13 g/dL (ref 12.0–15.0)
LYMPHS ABS: 1 10*3/uL (ref 0.7–4.0)
LYMPHS PCT: 3 %
MCH: 25 pg — AB (ref 26.0–34.0)
MCHC: 32.1 g/dL (ref 30.0–36.0)
MCV: 77.9 fL — AB (ref 78.0–100.0)
MONO ABS: 0.6 10*3/uL (ref 0.1–1.0)
MONOS PCT: 2 %
NEUTROS PCT: 95 %
Neutro Abs: 27.5 10*3/uL (ref 1.7–7.7)
PLATELETS: 453 10*3/uL — AB (ref 150–400)
RBC: 5.2 MIL/uL — ABNORMAL HIGH (ref 3.87–5.11)
RDW: 17.5 % — AB (ref 11.5–15.5)
WBC: 29.2 10*3/uL — AB (ref 4.0–10.5)

## 2018-03-24 LAB — BLOOD GAS, ARTERIAL
ACID-BASE DEFICIT: 17.7 mmol/L — AB (ref 0.0–2.0)
Bicarbonate: 13.6 mmol/L — ABNORMAL LOW (ref 20.0–28.0)
Drawn by: 257701
FIO2: 100
O2 SAT: 16.6 %
PCO2 ART: 64.3 mmHg — AB (ref 32.0–48.0)
PH ART: 6.957 — AB (ref 7.350–7.450)
Patient temperature: 98.6

## 2018-03-24 LAB — BASIC METABOLIC PANEL
ANION GAP: 15 (ref 5–15)
BUN: 60 mg/dL — ABNORMAL HIGH (ref 8–23)
CALCIUM: 7.4 mg/dL — AB (ref 8.9–10.3)
CHLORIDE: 102 mmol/L (ref 98–111)
CO2: 14 mmol/L — AB (ref 22–32)
Creatinine, Ser: 2.96 mg/dL — ABNORMAL HIGH (ref 0.44–1.00)
GFR calc non Af Amer: 16 mL/min — ABNORMAL LOW (ref >60)
GFR, EST AFRICAN AMERICAN: 18 mL/min — AB (ref >60)
Glucose, Bld: 152 mg/dL — ABNORMAL HIGH (ref 70–99)
Potassium: >7.5 mmol/L (ref 3.5–5.1)
SODIUM: 131 mmol/L — AB (ref 135–145)

## 2018-03-24 LAB — GLUCOSE, CAPILLARY
GLUCOSE-CAPILLARY: 85 mg/dL (ref 70–99)
Glucose-Capillary: 136 mg/dL — ABNORMAL HIGH (ref 70–99)
Glucose-Capillary: 122 mg/dL — ABNORMAL HIGH (ref 70–99)
Glucose-Capillary: 89 mg/dL (ref 70–99)

## 2018-03-24 MED ORDER — DEXTROSE 50 % IV SOLN
INTRAVENOUS | Status: AC
  Administered 2018-03-24: 08:00:00
  Filled 2018-03-24: qty 50

## 2018-03-24 MED ORDER — NALOXONE HCL 0.4 MG/ML IJ SOLN
0.4000 mg | INTRAMUSCULAR | Status: DC | PRN
  Administered 2018-03-24: 0.4 mg via INTRAVENOUS

## 2018-03-24 MED ORDER — FENTANYL CITRATE (PF) 100 MCG/2ML IJ SOLN
50.0000 ug | Freq: Once | INTRAMUSCULAR | Status: AC
  Administered 2018-03-24: 50 ug via INTRAVENOUS

## 2018-03-24 MED ORDER — FENTANYL CITRATE (PF) 100 MCG/2ML IJ SOLN
INTRAMUSCULAR | Status: AC
  Administered 2018-03-24: 100 ug
  Filled 2018-03-24: qty 2

## 2018-03-24 MED ORDER — PROPOFOL 10 MG/ML IV BOLUS
INTRAVENOUS | Status: DC | PRN
  Administered 2018-03-24: 80 mg via INTRAVENOUS

## 2018-03-24 MED ORDER — OXYCODONE HCL 5 MG PO TABS
5.0000 mg | ORAL_TABLET | Freq: Once | ORAL | Status: AC
  Administered 2018-03-24: 5 mg via ORAL
  Filled 2018-03-24: qty 1

## 2018-03-24 MED ORDER — NOREPINEPHRINE 4 MG/250ML-% IV SOLN
0.0000 ug/min | INTRAVENOUS | Status: DC
  Administered 2018-03-24: 2 ug/min via INTRAVENOUS
  Filled 2018-03-24: qty 250

## 2018-03-24 MED ORDER — FENTANYL 2500MCG IN NS 250ML (10MCG/ML) PREMIX INFUSION
25.0000 ug/h | INTRAVENOUS | Status: DC
  Administered 2018-03-24: 50 ug/h via INTRAVENOUS
  Filled 2018-03-24: qty 250

## 2018-03-24 MED ORDER — FENTANYL BOLUS VIA INFUSION
50.0000 ug | INTRAVENOUS | Status: DC | PRN
  Filled 2018-03-24: qty 50

## 2018-03-24 MED ORDER — EPHEDRINE 5 MG/ML INJ
INTRAVENOUS | Status: AC
  Filled 2018-03-24: qty 10

## 2018-03-24 MED ORDER — FENTANYL CITRATE (PF) 100 MCG/2ML IJ SOLN: 100.0000 ug | INTRAMUSCULAR | Status: AC

## 2018-03-24 MED ORDER — PROPOFOL 10 MG/ML IV BOLUS
INTRAVENOUS | Status: AC
  Filled 2018-03-24: qty 20

## 2018-03-24 MED ORDER — NALOXONE HCL 0.4 MG/ML IJ SOLN
INTRAMUSCULAR | Status: AC
  Administered 2018-03-24: 07:00:00
  Filled 2018-03-24: qty 1

## 2018-03-24 MED FILL — Medication: Qty: 1 | Status: AC

## 2018-03-25 ENCOUNTER — Encounter: Payer: Self-pay | Admitting: Family Medicine

## 2018-03-25 LAB — GLUCOSE, CAPILLARY
GLUCOSE-CAPILLARY: 11 mg/dL — AB (ref 70–99)
Glucose-Capillary: 23 mg/dL — CL (ref 70–99)
Glucose-Capillary: <10 mg/dL — CL (ref 70–99)

## 2018-03-25 NOTE — Progress Notes (Signed)
Patient ID: Tracy Flynn, female   DOB: 1956/01/25, 62 y.o.   MRN: [id number]  Notification received electronically that patient passed away on 2018/04/05 however her death summary is not yet available for review. Patient had metastatic ovarian cancer with recurrent malignant ascites and left sided malignant pleural effusion. Patient unfortunately never followed up with oncology after a hospitalization in June  with imaging showing a large left pleural effusion as well as pelvic mass and evidence of metastatic disease to the liver as well as ascites.. Patient also had a pelvic US showing evidence of ovarian mass and endometrial thickening during this hospitalization from 11/28/17-11/29/17

## 2018-03-31 ENCOUNTER — Telehealth: Payer: Self-pay | Admitting: Pulmonary Disease

## 2018-03-31 NOTE — Telephone Encounter (Signed)
03/31/18 Receive DC from [facility] for Dr. Valeta Harms to sign in [place] for [employer]

## 2018-04-01 ENCOUNTER — Telehealth: Payer: Self-pay

## 2018-04-01 NOTE — Telephone Encounter (Signed)
Received d/c back from [facility].. I called funeral home to let them know d/c is ready for pickup.  [phone] mc

## 2018-04-23 NOTE — Progress Notes (Signed)
Fentanyl Drip wasted, 240 cc  WIS, witness Curlene Dolphin.

## 2018-04-23 NOTE — Care Management Note (Signed)
Case Management Note  Patient Details  Name: Tracy Flynn MRN: [id number] Date of Birth: 08/16/1955  Subjective/Objective:                  transferredfrom floor due to resp. Arrest made dnr by p[facility]m,left lung frozenper ir  Action/Plan: Will follow for progression of care and clinical status. Will follow for case management needs none present at this time.  Expected Discharge Date:                  Expected Discharge Plan:     In-House Referral:     Discharge planning Services     Post Acute Care Choice:    Choice offered to:     DME Arranged:    DME Agency:     HH Arranged:    HH Agency:     Status of Service:     If discussed at [employer], dates discussed:    Additional Comments:  Leeroy Cha, RN 2018-04-07, 11:08 AM

## 2018-04-23 NOTE — Procedures (Signed)
Extubation Procedure Note  Patient Details:   Name: Tracy Flynn DOB: 03-03-1956 MRN: [id number]   Airway Documentation:  Airway 7.5 mm (Active)  Secured at (cm) 20 cm April 17, 2018 12:00 AM   Vent end date: 2018/04/17 Vent end time: 1058   Evaluation  O2 sats: currently acceptable Complications: No apparent complications Patient did tolerate procedure well. Bilateral Breath Sounds: Coarse crackles, Diminished   No   RT called by RN to remove patient from ventilator due to patient passing and family wishes. RT available as needed.   Lamonte Sakai 2018-04-17, 12:35 PM

## 2018-04-23 NOTE — Transfer of Care (Signed)
Immediate Anesthesia Transfer of Care Note  Patient: Tracy Flynn  Procedure(s) Performed: AN AD [place]  Patient Location: ICU  Anesthesia Type:Floor intubation  Level of Consciousness: lethargic  Airway & Oxygen Therapy: Patient remains intubated per anesthesia plan and Patient placed on Ventilator (see vital sign flow sheet for setting)  Post-op Assessment: Report given to RN and Post -op Vital signs reviewed and stable  Post vital signs: Reviewed and stable  Last Vitals:  Vitals Value Taken Time  BP 69/47 04-03-2018  8:09 AM  Temp    Pulse 89 04/03/18  8:16 AM  Resp 21 04-03-2018  8:17 AM  SpO2 82 % 04/03/2018  8:16 AM  Vitals shown include unvalidated device data.  Last Pain:  Vitals:   2018-04-03 0436  TempSrc: Oral  PainSc:       Patients Stated Pain Goal: 4 (18/34/37 3578)  Complications: No apparent anesthesia complications

## 2018-04-23 NOTE — Progress Notes (Signed)
[facility]   62 year old female with medical history significant for metastatic ovarian cancer with recurrent malignant ascites and malignant pleural effusions who was admitted due to shortness of breath under good pleural effusion, underwent thoracentesis yesterday, removed 900 cc and had residual pneumothorax.  Patient this morning was found to be unresponsive and hypoxic.  Rapid response was initiated, found to be hypotensive, was given IV fluids with good response to blood pressures.  VBG with pH of 6.9, O2 of 16, patient was given bicarb and after long discussion with son regarding goals of care and family made the decision to intubate patient.  EKG  wide complex QRS, magnesium was given.  Patient intubated, case discussed with critical care and transferred to the ICU.  Chipper Oman, MD

## 2018-04-23 NOTE — Progress Notes (Signed)
Pt arrived on unit before 8am after a code blue was initiated and pt intubated on the floor. Pt arrived unresponsive to voice, tough or pain. SBP in the 60s and MAPs in 30s. MD and NP at bedside ordered levophed . BS 11 upon arrival and an amp of D50 was given. MD accessed pleurex and drained 551ml of bloody fluid and air from chest tube. Chest tube hooked to suction. MD and NP spoke with family who made pt DNR and to focus on comfort. Family stayed at bedside with pt until expiration. Myself and Dorthea Cove, RNs pronounced pt at 1047 with MD and NP at bedside. All lines, tubes and drains were removed.

## 2018-04-23 NOTE — Progress Notes (Signed)
Late entry-0930 pt moving arms around, catching on ET tubing, placed mittens on pt however still getting arms caught up in  Vent tubing.  Placed in wrist restraints bilateral discussed with bedside RN, Marni Griffon ACNP notified received order for restraints.  I was unable to place the verbal order in Epic prior to her expiring.

## 2018-04-23 NOTE — ED Provider Notes (Addendum)
Department of Emergency Medicine   Code Blue CONSULT NOTE  Chief Complaint: Code blue called unresponsive  Level V Caveat: Unresponsive  History of present illness: I was contacted by the hospital for a CODE BLUE  upstairs and presented to the patient's bedside.  Anesthesia service was already at the bedside managing the patient's airway.  Resuscitation efforts had already been started.  When I arrived the patient had a pulse, so CPR was not being administered, however the patient was having assisted ventilation with air bag valve mask.  CBG had been ordered.  According to the staff the patient had a history of history of metastatic cancer with complications including malignant ascites and a malignant pleural effusion.  Patient is a full code  ROS: Unable to obtain, Level V caveat  Scheduled Meds: . dextrose      . docusate sodium  100 mg Oral BID  . feeding supplement (ENSURE ENLIVE)  237 mL Oral BID BM  . ferrous sulfate  325 mg Oral BID WC  . folic acid  1 mg Oral Daily  . naloxone      . nystatin  5 mL Oral QID  . pantoprazole  40 mg Oral Daily    Past Surgical History:  Procedure Laterality Date  . HERNIA REPAIR    . IR GUIDED DRAIN W CATHETER PLACEMENT  03/22/2018  . IR PARACENTESIS  01/15/2018  . IR THORACENTESIS ASP PLEURAL SPACE W/IMG GUIDE  12/11/2017  . IR THORACENTESIS ASP PLEURAL SPACE W/IMG GUIDE  12/29/2017  . IR THORACENTESIS ASP PLEURAL SPACE W/IMG GUIDE  01/13/2018    Allergies  Allergen Reactions  . Codeine Rash  . Penicillins Rash    Has patient had a PCN reaction causing immediate rash, facial/tongue/throat swelling, SOB or lightheadedness with hypotension: Yes Has patient had a PCN reaction causing severe rash involving mucus membranes or skin necrosis:No Has patient had a PCN reaction that required hospitalization: No Has patient had a PCN reaction occurring within the last 10 years: No If all of the above answers are "NO", then may proceed with  Cephalosporin use.     Last set of Vital Signs (not current) Vitals:   03/23/18 2105 April 01, 2018 0436  BP: 106/76 117/77  Pulse: (!) 103   Resp: 20   Temp: 97.6 F (36.4 C) 97.8 F (36.6 C)  SpO2: 100%       Physical Exam Gen: unresponsive Cardiovascular: Strong femoral pulse Resp: apneic.  Patient is breathing spontaneously but is being assisted with bag valve mask ventilation Abd: Distended, ascites Neuro: Patient is not answering questions, she does withdraw to pain HEENT: No obvious secretions Neck: No crepitus  Musculoskeletal: No deformity  Skin: warm    Medical Decision making  Patient's initial CBG read out as extremely low.  Patient was given an amp of D50.  Patient was also given IV fluids before my arrival.  We reassessed her CBG after approximately 5 minutes and her blood sugar was still low at 23.  Mental status had not improved significantly.  Patient was given another amp of D50.  Patient had a brief episode of a wide-complex rhythm that was not tachycardic.  She was given a dose of magnesium.  Laboratory tests are pending.  Hospitalist service arrived to the bedside and assumed care of the patient.   Assessment and Plan  Patient with metastatic cancer had an episode of unresponsiveness associated with hypotension and hypoglycemia.  Patient was treated with glucose.  Anesthesia was at the bedside  managing the patient's airway.  Plan was to discuss goals of care with family.  Further care by hospitalist service, Dr Gweneth Dimitri, MD 03-28-18 [phone]

## 2018-04-23 NOTE — Anesthesia Procedure Notes (Addendum)
Procedure Name: Intubation Date/Time: 01-Apr-2018 7:49 AM Performed by: Anne Fu, CRNA Pre-anesthesia Checklist: Patient identified, Emergency Drugs available, Suction available, Patient being monitored and Timeout performed Patient Re-evaluated:Patient Re-evaluated prior to induction Oxygen Delivery Method: Circle system utilized Preoxygenation: Pre-oxygenation with 100% oxygen Induction Type: IV induction, Rapid sequence and Cricoid Pressure applied Ventilation: Mask ventilation without difficulty Laryngoscope Size: Mac and 4 Grade View: Grade I Tube type: Oral Tube size: 7.5 mm Number of attempts: 1 Airway Equipment and Method: Video-laryngoscopy and Stylet Placement Confirmation: ETT inserted through vocal cords under direct vision,  positive ETCO2 and breath sounds checked- equal and bilateral Secured at: 20 cm Tube secured with: Tape Dental Injury: Teeth and Oropharynx as per pre-operative assessment  Comments: Glidescope X 1 with positive ETCO2 noted post placed. OG placed prior to intubation.

## 2018-04-23 NOTE — Progress Notes (Signed)
Called to pt's room during VS assessment d/t inability to get an O2 saturation. Pt sitting on the side of the bed. Pt responding verbally and physically to commands at that time. RT called to assess pt and attempt to get an accurate O2 saturation. Pt hooked up to continuous pulse ox via tele box. NP on call, Baltazar Najjar, updated regarding pt condition. Advised to refrain from draining pleurx catheter at this time. O2 saturation 94% on 4L O2. This RN was again called into pt's room by family to help pt readjust in bed. Upon assessment, pt breathing appears more labored. Pt not responding to commands or questions asked. Rapid response called at this time.

## 2018-04-23 NOTE — Progress Notes (Addendum)
Chaplain responded to pager request for Chaplain presence for the family of the patient as the patient had been coded. Presented to the patient's room and was redirected to the waiting area where the family was gathered. Introduced self as Clinical biochemist and provided [employer] of presence for those present, and was also informed that there was a family meeting with the Dr. For an update on the patients medical status. Chaplain will continue to follow for emotional support. Chaplain Yaakov Guthrie [phone]  10:50 am Chaplain notified that the patient had died. Family was present at the bedside. Chaplain offered comfort measures, ministry of presence, prayer and informed of all needed information for the staff for final arrangement for the patient. The  family was appreciative of all the care of her loved one

## 2018-04-23 NOTE — Death Summary Note (Signed)
DEATH SUMMARY   Patient Details  Name: Tracy Flynn MRN: [id number] DOB: June 10, 1956  Admission/Discharge Information   Admit Date:  2018-03-28  Date of Death: Date of Death: 04/09/18  Time of Death: Time of Death: 68  Length of Stay: 09/15/2022  Referring Physician: Antony Blackbird, MD   Reason(s) for Hospitalization  Metastatic Ovarian Cancer  Diagnoses  Preliminary cause of death:  Secondary Diagnoses (including complications and co-morbidities):  Principal Problem:   Malignant pleural effusion: Left Active Problems:   Ovarian tumor   Recurrent pleural effusion on left   Cancer, metastatic [facility])   Ascites   Brief Hospital Course (including significant findings, care, treatment, and services provided and events leading to death)  Tracy Flynn is a 62 y.o. 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 Has refused neoadjuvant chemotherapy. Admitted w/ recurrent respiratory failure. S/p pleur-x on left 9/30. Found unresponsive 10/1, hypotensive, hypoglycemic and in shock on 2023/04/10 intubated by EDP and sent to ICU."  The decision was made for no escalation of care due to the patients chronic illness and terminal cancer state. The patient passed with comfort measures and family at bedside.    Pertinent Labs and Studies  Significant Diagnostic Studies Dg Chest 1 View  Result Date: 03/13/2018 CLINICAL DATA:  Status post left thoracentesis. EXAM: CHEST  1 VIEW COMPARISON:  03/28/18 FINDINGS: There is decreased fluid on the left following thoracentesis. There is still significant opacity extending from the left mid to left lower hemithorax obscuring the left heart border and hemidiaphragm. This is consistent with a combination of lung consolidation and/or atelectasis and residual fluid. No pneumothorax. Right lung remains clear. IMPRESSION: 1. Decreased left pleural fluid following thoracentesis. No pneumothorax. Electronically Signed   By: Lajean Manes M.D.   On: 03/13/2018 10:27   Dg Chest 1 View  Result Date: 03/02/2018 CLINICAL DATA:  Status post thoracentesis. EXAM: CHEST  1 VIEW COMPARISON:  Radiograph of March 01, 2018. FINDINGS: Grossly stable large left pleural effusion is noted with probable atelectasis. No pneumothorax is noted. Right lung is unremarkable except for minimal pleural effusion. Bony thorax is unremarkable. IMPRESSION: No significant change in large left-sided pleural effusion. No definite pneumothorax is noted. Electronically Signed   By: Marijo Conception, M.D.   On: 03/02/2018 15:53   Dg Chest 2 View  Result Date: 03/28/2018 CLINICAL DATA:  Former smoker with dyspnea beginning last week. EXAM: CHEST - 2 VIEW COMPARISON:  03/01/2018 and CXR 03/02/2018 FINDINGS: Unchanged large left pleural effusion spanning at least 6 vertebral body heights on the lateral view. This pleural effusion obscures much of the left heart border. The aortic arch is nonaneurysmal. Right heart border is obscured by overlap with the thoracic spine. There is dextroconvex curvature of the thoracic spine as before. The right lung is clear though the apex is excluded on the frontal view. Metallic staples project over the superior mediastinum as before. IMPRESSION: Unchanged large left pleural effusion. Electronically Signed   By: Ashley Royalty M.D.   On: Mar 28, 2018 22:33   Dg Abd 1 View  Result Date: 03/13/2018 CLINICAL DATA:  Nausea and vomiting.  Ascites. EXAM: ABDOMEN - 1 VIEW COMPARISON:  No comparison studies available. FINDINGS: No gaseous bowel dilatation to suggest obstruction. Diffuse hazy soft tissue attenuation over the abdomen is compatible with ascites and there is some centralization of bowel loops. Sclerotic lesion right ninth rib likely a bone island. Lumbar scoliosis IMPRESSION: Diffuse hazy opacity over  the abdomen with some centralization of bowel loops. Imaging features are compatible with reported history of ascites Electronically  Signed   By: Misty Stanley M.D.   On: 03/13/2018 14:46   Ir Guided Niel Hummer W Catheter Placement  Result Date: 03/22/2018 CLINICAL DATA:  Malignant recurrent large left pleural effusion. EXAM: INSERTION OF TUNNELED PLEURAL DRAINAGE CATHETER ANESTHESIA/SEDATION: Intravenous Fentanyl and Versed were administered as conscious sedation during continuous monitoring of the patient's level of consciousness and physiological / cardiorespiratory status by the radiology RN, with a total moderate sedation time of 15 minutes. MEDICATIONS: As antibiotic prophylaxis, clindamycin 900 mg was ordered pre-procedure and administered intravenously within one hour of incision. FLUOROSCOPY TIME:  Less than 0.1 minute; 5  uGym2 DAP PROCEDURE: The procedure, risks, benefits, and alternatives were explained to the patient. Questions regarding the procedure were encouraged and answered. The patient understands and consents to the procedure. Survey ultrasound of the left chest was performed and an appropriate skin entry site was determined and marked. The left chest wall was prepped with Betadine in a sterile fashion, and a sterile drape was applied covering the operative field. A sterile gown and sterile gloves were used for the procedure. Local anesthesia was provided with 1% Lidocaine. Ultrasound image documentation was performed. Fluoroscopy during the procedure and fluoroscopic spot radiograph confirms appropriate catheter position. After creating a small skin incision, a 19 gauge needle was advanced into the pleural cavity under ultrasound guidance. A guide wire was then advanced under fluoroscopy into the pleural space. Pleural access was dilated serially and a 16-French peel-away sheath placed. A 16 French tunneled PleurX catheter was placed. This was tunneled from an incision 5 cm below the pleural access to the access site. The catheter was advanced through the peel-away sheath. The sheath was then removed. Final catheter  positioning was confirmed with a fluoroscopic spot image. The access incision was closed with Dermabond was applied to the incision. A Prolene retention suture was applied at the catheter exit site. Large volume thoracentesis was performed through the new catheter utilizing gravity drainage bag. COMPLICATIONS: None. FINDINGS: The catheter was placed via the left lateral chest wall. Catheter course is towards the apex. Approximately 900 mL of pleural fluid was able to be removed after catheter placement. The patient will be brought back in 10 to 14days to remove the temporary exit site retention suture after appropriate in-growth around the subcutaneous cuff of the catheter. IMPRESSION: Placement of permanent, tunneled left pleural drainage catheter via lateral approach. 900 mL of pleural fluid was removed today after catheter placement. Electronically Signed   By: Lucrezia Europe M.D.   On: 03/22/2018 15:16   US Renal  Result Date: 03/23/2018 CLINICAL DATA:  62 year old female with metastatic ovarian cancer. Acute renal failure. Subsequent encounter. EXAM: RENAL / URINARY TRACT ULTRASOUND COMPLETE COMPARISON:  11/26/2017 CT. FINDINGS: Right Kidney: Length: 8.8 cm. Increased echogenicity. No hydronephrosis or focal mass. Left Kidney: Length: 9.5 cm. Increased echogenicity. No hydronephrosis or focal mass. Bladder: Large pelvic mass spanning over 17.4 x 10.3 x 13.6 cm. Bladder not seen separate from mass. Septated ascites.  Right-sided pleural effusion. IMPRESSION: 1. Increased renal parenchyma echogenicity bilaterally may reflect result of medical renal disease. 2. No hydronephrosis. 3. Large pelvic mass measuring up to 17.4 cm. Bladder not seen separate from this mass. 4. Septated ascites. 5. Right-sided pleural effusion. Electronically Signed   By: Genia Del M.D.   On: 03/23/2018 18:33   US Paracentesis  Result Date: 03/20/2018 INDICATION: Patient with  malignant ascites. Request is made for therapeutic  paracentesis. EXAM: ULTRASOUND GUIDED THERAPEUTIC PARACENTESIS MEDICATIONS: 10 mL 1% lidocaine COMPLICATIONS: None immediate. PROCEDURE: Informed written consent was obtained from the patient after a discussion of the risks, benefits and alternatives to treatment. A timeout was performed prior to the initiation of the procedure. Initial ultrasound scanning demonstrates a moderate amount of ascites within the right lower abdominal quadrant. The right lower abdomen was prepped and draped in the usual sterile fashion. 1% lidocaine with epinephrine was used for local anesthesia. Following this, a 19 gauge, 7-cm, Yueh catheter was introduced. An ultrasound image was saved for documentation purposes. The paracentesis was performed. The catheter was removed and a dressing was applied. The patient tolerated the procedure well without immediate post procedural complication. FINDINGS: A total of approximately 4.4 liters of chylous fluid was removed. IMPRESSION: Successful ultrasound-guided paracentesis yielding 4.4 liters of peritoneal fluid. Read by: Brynda Greathouse PA-C Electronically Signed   By: Marybelle Killings M.D.   On: 03/20/2018 11:05   US Paracentesis  Result Date: 03/16/2018 INDICATION: Patient with history of metastatic gynecological cancer, recurrent left pleural effusion and ascites. Request made for therapeutic paracentesis. EXAM: ULTRASOUND GUIDED THERAPEUTIC PARACENTESIS MEDICATIONS: None COMPLICATIONS: None immediate PROCEDURE: Informed written consent was obtained from the patient after a discussion of the risks, benefits and alternatives to treatment. A timeout was performed prior to the initiation of the procedure. Initial ultrasound scanning demonstrates a large amount of ascites within the right lower abdominal quadrant. The right lower abdomen was prepped and draped in the usual sterile fashion. 1% lidocaine was used for local anesthesia. Following this, a 19 gauge, 7-cm, Yueh catheter was introduced.  An ultrasound image was saved for documentation purposes. The paracentesis was performed. The catheter was removed and a dressing was applied. The patient tolerated the procedure well without immediate post procedural complication. FINDINGS: A total of approximately 6.8 liters of chylous/milky fluid was removed. IMPRESSION: Successful ultrasound-guided therapeutic paracentesis yielding 6.8 liters of peritoneal fluid. Read by: Rowe Robert, PA-C Electronically Signed   By: Aletta Edouard M.D.   On: 03/14/2018 16:10   US Paracentesis  Result Date: 03/03/2018 INDICATION: Patient with history of metastatic gynecological cancer, recurrent ascites. Request made for therapeutic paracentesis up to 5.5 liters. EXAM: ULTRASOUND GUIDED THERAPEUTIC PARACENTESIS MEDICATIONS: None COMPLICATIONS: None immediate. PROCEDURE: Informed written consent was obtained from the patient after a discussion of the risks, benefits and alternatives to treatment. A timeout was performed prior to the initiation of the procedure. Initial ultrasound scanning demonstrates a large amount of ascites within the left lower abdominal quadrant. The left lower abdomen was prepped and draped in the usual sterile fashion. 1% lidocaine was used for local anesthesia. Following this, a 19 gauge, 7-cm, Yueh catheter was introduced. An ultrasound image was saved for documentation purposes. The paracentesis was performed. The catheter was removed and a dressing was applied. The patient tolerated the procedure well without immediate post procedural complication. FINDINGS: A total of approximately 5.5 liters of chylous/milky fluid was removed. IMPRESSION: Successful ultrasound-guided therapeutic paracentesis yielding 5.5 liters of peritoneal fluid. Read by: Rowe Robert, PA-C Electronically Signed   By: Jacqulynn Cadet M.D.   On: 03/03/2018 11:56   Dg Chest Port 1 View  Result Date: 04-18-2018 CLINICAL DATA:  Intubated, code EXAM: PORTABLE CHEST 1 VIEW  COMPARISON:  04-18-2018 FINDINGS: Endotracheal tube is 4 cm above the carina. NG tube enters the stomach. Slightly increased size of the left pneumothorax with PleurX catheter remaining in place.  Diffuse airspace disease throughout the right lung has worsened since prior study. Moderate layering right effusion. IMPRESSION: Slight increased size of the large left pneumothorax with PleurX catheter remain in place. Layering right effusion with diffuse right lung airspace disease. Electronically Signed   By: Rolm Baptise M.D.   On: April 01, 2018 08:23   Dg Chest Port 1 View  Result Date: April 01, 2018 CLINICAL DATA:  62 y/o F; pneumothorax. Increase shortness of breath. EXAM: PORTABLE CHEST 1 VIEW COMPARISON:  03/23/2018 chest radiograph. FINDINGS: Stable cardiac silhouette given projection and technique. Stable large left pneumothorax and small left effusion. Atelectasis of left lung. Clear right lung. Left-sided pleural drain in situ. S-shaped curvature of the spine. IMPRESSION: Stable large left pneumothorax and small left effusion with pleural drain situ. Electronically Signed   By: Kristine Garbe M.D.   On: 2018/04/01 05:45   Dg Chest Port 1 View  Result Date: 03/23/2018 CLINICAL DATA:  Follow-up pleural effusions. EXAM: PORTABLE CHEST 1 VIEW COMPARISON:  03/19/2018 FINDINGS: Patient is rotated to the left. Examination demonstrates interval placement of left pleural drainage catheter with tip over the left lung base. There is significant improvement in the previously noted left effusion with small residual left pleural effusion noted. Left basilar atelectasis. Evidence of moderate size left pneumothorax. Minimal prominence of the right perihilar markings. Remainder the exam is unchanged. IMPRESSION: Significant improvement in the left pleural effusion with small residual left pleural effusion and left basilar atelectasis post placement of left pleural drainage catheter. Evidence of moderate size left  pneumothorax. These results were called by telephone at the time of interpretation on 03/23/2018 at 3:45 pm to patient's nurse, Joanne Chars, who verbally acknowledged these results and will notify covering physician. Electronically Signed   By: Marin Olp M.D.   On: 03/23/2018 15:46   Dg Chest Port 1 View  Result Date: 03/19/2018 CLINICAL DATA:  Increased shortness of breath. EXAM: PORTABLE CHEST 1 VIEW COMPARISON:  March 13, 2018 FINDINGS: Large effusion with underlying opacification of the left chest, stable. Poorly aerated left lung. The right lung remains clear. There is volume loss on the left with shift of the heart mediastinum to the left. No pneumothorax. IMPRESSION: No change in the large left pleural effusion, underlying opacity, and left-sided volume loss. Electronically Signed   By: Dorise Bullion III M.D   On: 03/19/2018 20:42   Dg Abd Portable 1v  Result Date: 04/01/2018 CLINICAL DATA:  OG tube placement EXAM: PORTABLE ABDOMEN - 1 VIEW COMPARISON:  None. FINDINGS: OG tube tip is in the mid stomach. Mild diffuse gaseous distention of bowel. IMPRESSION: OG tube tip in the mid stomach. Electronically Signed   By: Rolm Baptise M.D.   On: 04-01-18 08:26   US Thoracentesis Asp Pleural Space W/img Guide  Result Date: 03/13/2018 INDICATION: Shortness of breath, recurrent left malignant effusion EXAM: ULTRASOUND GUIDED LEFT THORACENTESIS MEDICATIONS: 1% lidocaine local COMPLICATIONS: None immediate. PROCEDURE: An ultrasound guided thoracentesis was thoroughly discussed with the patient and questions answered. The benefits, risks, alternatives and complications were also discussed. The patient understands and wishes to proceed with the procedure. Written consent was obtained. Ultrasound was performed to localize and mark an adequate pocket of fluid in the left chest. The area was then prepped and draped in the normal sterile fashion. 1% Lidocaine was used for local anesthesia. Under  ultrasound guidance a 6 Fr Safe-T-Centesis catheter was introduced. Thoracentesis was performed. The catheter was removed and a dressing applied. FINDINGS: A total of approximately 900  cc of blood-tinged pleural fluid was removed. Sample was not sent for laboratory analysis IMPRESSION: Successful ultrasound guided left thoracentesis yielding 900 cc of pleural fluid. Electronically Signed   By: Jerilynn Mages.  Shick M.D.   On: 03/13/2018 10:18   US Thoracentesis Asp Pleural Space W/img Guide  Result Date: 03/02/2018 INDICATION: Patient with history of metastatic gynecological cancer, recurrent malignant left pleural effusion. Request made for therapeutic left thoracentesis. EXAM: ULTRASOUND GUIDED THERAPEUTIC LEFT THORACENTESIS MEDICATIONS: None COMPLICATIONS: None immediate. PROCEDURE: An ultrasound guided thoracentesis was thoroughly discussed with the patient and questions answered. The benefits, risks, alternatives and complications were also discussed. The patient understands and wishes to proceed with the procedure. Written consent was obtained. Ultrasound was performed to localize and mark an adequate pocket of fluid in the left chest. The area was then prepped and draped in the normal sterile fashion. 1% Lidocaine was used for local anesthesia. Under ultrasound guidance a 6 Fr Safe-T-Centesis catheter was introduced. Thoracentesis was performed. The catheter was removed and a dressing applied. FINDINGS: A total of approximately 1.2 liters of hazy, brown fluid was removed. Due to patient chest discomfort only the above amount of fluid was removed today. IMPRESSION: Successful ultrasound guided therapeutic left thoracentesis yielding 1.2 liters of pleural fluid. Read by: Rowe Robert, PA-C Electronically Signed   By: Aletta Edouard M.D.   On: 03/02/2018 15:35    Microbiology No results found for this or any previous visit (from the past 240 hour(s)).  Lab Basic Metabolic Panel: No results for input(s): NA, K,  CL, CO2, GLUCOSE, BUN, CREATININE, CALCIUM, MG, PHOS in the last 168 hours. Liver Function Tests: No results for input(s): AST, ALT, ALKPHOS, BILITOT, PROT, ALBUMIN in the last 168 hours. No results for input(s): LIPASE, AMYLASE in the last 168 hours. No results for input(s): AMMONIA in the last 168 hours. CBC: No results for input(s): WBC, NEUTROABS, HGB, HCT, MCV, PLT in the last 168 hours. Cardiac Enzymes: No results for input(s): CKTOTAL, CKMB, CKMBINDEX, TROPONINI in the last 168 hours. Sepsis Labs: No results for input(s): PROCALCITON, WBC, LATICACIDVEN in the last 168 hours.  Procedures/Operations    Octavio Graves Icard 04/01/2018, 9:13 AM

## 2018-04-23 NOTE — Consult Note (Signed)
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 Has refused neoadjuvant chemotherapy. Admitted w/ recurrent respiratory failure. S/p pleur-x on left 9/30.  Found unresponsive 10/1, hypotensive, hypoglycemic and in shock on 10/2 intubated by EDP and sent to ICU  Past Medical History  Stage IV GYN cancer  Significant Hospital Events   10/2: PCXR showed larger left ptx. Placed on suction w/ 1/7 airleak noted. Levophed gtt started. Family discussion had. Agreed to no escalation of care.   Consults: date of consult/date signed off & final recs:    Procedures (surgical and bedside):  left pleur-x 9/20 oett  10/2>>>  Significant Diagnostic Tests:    Micro Data:    Antimicrobials:    Subjective:  Unable   Objective   Blood pressure 117/77, pulse (Abnormal) 103, temperature (Abnormal) 96 F (35.6 C), temperature source Rectal, resp. rate 20, height 5' 6"  (1.676 m), weight 66.2 kg, SpO2 100 %.    Vent Mode: PRVC FiO2 (%):  [100 %] 100 % Set Rate:  [20 bmp] 20 bmp Vt Set:  [470 mL] 470 mL PEEP:  [5 cmH20] 5 cmH20 Plateau Pressure:  [25 cmH20] 25 cmH20   Intake/Output Summary (Last 24 hours) at 04-14-2018 1016 Last data filed at 04/14/2018 0600 Gross per 24 hour  Intake 1857.25 ml  Output no documentation  Net 1857.25 ml   Filed Weights   03/11/2018 2107  Weight: 66.2 kg    Examination: General: frail malnourished aaf unresponsive on vent HENT: NCAT orally intubated  Lungs: decreased t/o L>R w/ 1/7 airleak on vent  Cardiovascular: RRR Abdomen: ascites. Firm, shifting dullness to percussion  Extremities: diffuse anasarca  Neuro: GCS 3 GU: cnc yellow   Resolved Hospital Problem list     Assessment & Plan:  Acute hypoxic respiratory  failure  Plan Full vent support  Wean oxygen  PAD protocol  Large left pneumothorax. Now w/ large left PTX.  Plan Cont CT to suction F/u CXR  Acute metabolic encephalopathy: multifactorial: Toxic, hypoglycemia, renal failure and acidosis Plan Supportive care  Circulatory shock.  Suspect multifactorial.  Could be degree of tension physiology, but more likely acidosis mediated and simply underlying decline from her malignancy Plan Continuing telemetry monitoring No further titration of vasoactive drips per family discussion  Progressive renal failure, metabolic acidosis, and hyperkalemia Plan Continuing supportive care no escalation  Stage IV GYN malignancy Not a candidate for intervention Plan Supportive care  Disposition / Summary of Today's Plan April 14, 2018   This is a debilitated 62 year old female with an extensive GYN malignancy complicated by severe malnutrition, deconditioning, and recurrent malignant left effusion and ascites.  She was admitted for recurrent dyspnea and underwent left Pleurx placement on 9/30.  She remains short of breath, and in spite of supportive care has had progressive renal failure, metabolic acidosis, profound hyperkalemia, and also what looks like ex-vacuo pneumothorax post Pleurx placement.  Serial films had been unchanged initially.  She was found unresponsive and in respiratory failure this morning on 10/2.  She was intubated by EDP.  Postintubation film did show expansion of left pneumothorax, and on arrival we placed the Pleurx to suction.  She was found to be hypoglycemic and was treated for this.  Following the morning episode where the patient was intubated we  met with the family at baseline.  We discussed the patient's underlying malignancy, the events that have occurred which appears primarily multifactorial in the setting of metabolic encephalopathy, failure to thrive, and adamantly the consequences of her underlying malignancy resulting in  endorgan failure.  Based on previous discussions, and current findings as well as poor prognosis the family agreed with no escalation of care.  At that point we added a fentanyl infusion for pain, encouraged the family be at bedside.  They understood she would likely pass away from her critical illness    Diet: npo Pain/Anxiety/Delirium protocol (if indicated): PAD VAP protocol (if indicated): started DVT prophylaxis: SCD GI prophylaxis: H2B Hyperglycemia protocol: na Mobility: BR Code Status: DNR Family Communication: family updated at length   Labs   CBC: Recent Labs  Lab 03/18/18 1402 03/20/18 0823 03/23/18 1048 04-12-2018 0633  WBC  --  23.8* 34.6* 29.2*  NEUTROABS  --  21.8* 33.0* 27.5  HGB 11.8* 13.0 12.7 13.0  HCT 37.5 41.8 40.7 40.5  MCV  --  80.7 80.0 77.9*  PLT  --  595* 637* 453*    Basic Metabolic Panel: Recent Labs  Lab 03/19/18 0429 03/20/18 0823 03/21/18 1028 03/23/18 1208 Apr 12, 2018 0814  NA 132* 135 134* 131* 131*  K 4.9 5.8* 5.2* 5.9* >7.5*  CL 106 106 103 102 102  CO2 20* 20* 23 18* 14*  GLUCOSE 104* 124* 74 89 152*  BUN 40* 44* 44* 57* 60*  CREATININE 1.21* 1.54* 1.44* 2.46* 2.96*  CALCIUM 7.8* 8.4* 8.0* 8.5* 7.4*   GFR: Estimated Creatinine Clearance: 18.4 mL/min (A) (by C-G formula based on SCr of 2.96 mg/dL (H)). Recent Labs  Lab 03/20/18 0823 03/23/18 1048 04-12-2018 0633  WBC 23.8* 34.6* 29.2*    Liver Function Tests: Recent Labs  Lab 03/23/18 1208  AST 57*  ALT 47*  ALKPHOS 1,050*  BILITOT 0.8  PROT 6.2*  ALBUMIN 1.4*   No results for input(s): LIPASE, AMYLASE in the last 168 hours. No results for input(s): AMMONIA in the last 168 hours.  ABG    Component Value Date/Time   PHART 6.957 (LL) 2018/04/12 0716   PCO2ART 64.3 (H) 2018/04/12 0716   PO2ART  04/12/2018 0716    CRITICAL RESULT CALLED TO, READ BACK BY AND VERIFIED WITH:   HCO3 13.6 (L) 2018/04/12 0716   TCO2 24 02/04/2018 1000   ACIDBASEDEF 17.7 (H) 04-12-2018  0716   O2SAT 16.6 04-12-2018 0716     Coagulation Profile: Recent Labs  Lab 03/22/18 0424  INR 1.15    Cardiac Enzymes: No results for input(s): CKTOTAL, CKMB, CKMBINDEX, TROPONINI in the last 168 hours.  HbA1C: No results found for: HGBA1C  CBG: Recent Labs  Lab 04/12/18 0732 2018/04/12 0807 12-Apr-2018 0831 Apr 12, 2018 0854 04-12-18 0927  GLUCAP 136* 11* 89 122* 85    Admitting History of Present Illness.   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 Has refused neoadjuvant chemotherapy. We saw her last on 8/27 for recurrent left pleural effusion. At that time we had discussed likely course of her malignancy and made recommendations for Pleur-x tube placement for palliative symptom management. She was re-admitted on 9/20 w/ recurrent pleural effusion and ascites. IR was consulted.  On 9/21: left thoracentesis 940m 9/22 paracentesis: 6.8 liters removed. Rescinded her DNR status. Saying had to "fight for her granddaughter" refused palliative consult  9/28 paracentesis: 4.4 liters  9/30 left pleurx placed. Post-op w/ presumed pneumo- ex vacuo.  Very weak after still very short of breath.  10/2: increased work of breathing. This progressed to respiratory failure and decreased LOC. Was found unresponsive. Venous blood gas 6.9, bicarb 16.  She required intubation. PCXR showed worsening left pneumothorax. She was transferred to ICU. Seen by critical care in consult.  Chest tube placed to suction and levophed gtt started.   Review of Systems:   Unable  Past Medical History  She,  has a past medical history of Pleural effusion and Uterine fibroid.   Surgical History    Past Surgical History:  Procedure Laterality Date  . HERNIA REPAIR    . IR GUIDED DRAIN W CATHETER PLACEMENT  03/22/2018  . IR PARACENTESIS  01/15/2018  . IR THORACENTESIS ASP PLEURAL SPACE W/IMG GUIDE  12/11/2017  . IR THORACENTESIS ASP PLEURAL SPACE W/IMG GUIDE  12/29/2017  . IR  THORACENTESIS ASP PLEURAL SPACE W/IMG GUIDE  01/13/2018     Social History   Social History   Socioeconomic History  . Marital status: Single    Spouse name: Not on file  . Number of children: Not on file  . Years of education: Not on file  . Highest education level: Not on file  Occupational History  . Not on file  Social Needs  . Financial resource strain: Not on file  . Food insecurity:    Worry: Not on file    Inability: Not on file  . Transportation needs:    Medical: Not on file    Non-medical: Not on file  Tobacco Use  . Smoking status: Former Research scientist (life sciences)  . Smokeless tobacco: Never Used  Substance and Sexual Activity  . Alcohol use: Never    Frequency: Never  . Drug use: Never  . Sexual activity: Not Currently  Lifestyle  . Physical activity:    Days per week: Not on file    Minutes per session: Not on file  . Stress: Not on file  Relationships  . Social connections:    Talks on phone: Not on file    Gets together: Not on file    Attends religious service: Not on file    Active member of club or organization: Not on file    Attends meetings of clubs or organizations: Not on file    Relationship status: Not on file  . Intimate partner violence:    Fear of current or ex partner: Not on file    Emotionally abused: Not on file    Physically abused: Not on file    Forced sexual activity: Not on file  Other Topics Concern  . Not on file  Social History Narrative  . Not on file  ,  reports that she has quit smoking. She has never used smokeless tobacco. She reports that she does not drink alcohol or use drugs.   Family History   Her family history includes Cancer in her father.   Allergies Allergies  Allergen Reactions  . Codeine Rash  . Penicillins Rash    Has patient had a PCN reaction causing immediate rash, facial/tongue/throat swelling, SOB or lightheadedness with hypotension: Yes Has patient had a PCN reaction causing severe rash involving mucus  membranes or skin necrosis:No Has patient had a PCN reaction that required hospitalization: No Has patient had a PCN reaction occurring within the last 10 years: No If all of the above answers are "NO", then may proceed with Cephalosporin use.      Home Medications  Prior to Admission medications  Medication Sig Start Date End Date Taking? Authorizing Provider  cetirizine (ZYRTEC) 10 MG tablet Take 1 tablet (10 mg total) by mouth daily. Patient taking differently: Take 10 mg by mouth daily as needed for allergies.  12/10/17  Yes Ena Dawley, Tiffany S, PA-C  docusate sodium (COLACE) 100 MG capsule Take 1 capsule (100 mg total) by mouth 2 (two) times daily. 02/20/18  Yes Lavina Hamman, MD  ferrous sulfate 325 (65 FE) MG tablet Take 1 tablet (325 mg total) by mouth 2 (two) times daily with a meal. 02/25/18  Yes Fulp, Cammie, MD  folic acid (FOLVITE) 1 MG tablet Take 1 tablet (1 mg total) by mouth daily. 01/17/18  Yes Georgette Shell, MD  lactulose (CHRONULAC) 10 GM/15ML solution Take 45 mLs (30 g total) by mouth daily. 02/21/18  Yes Lavina Hamman, MD  potassium chloride (K-DUR) 10 MEQ tablet Take 2 tablets (20 mEq total) by mouth daily. MUST MAKE APPT FOR FURTHER REFILLS 03/10/18  Yes Fulp, Cammie, MD  torsemide (DEMADEX) 20 MG tablet Take 20 mg by mouth daily, can take 20 mg extra for weight gain of 3 lbs in 1 day. MUST MAKE APPT FOR FURTHER REFILLS 03/10/18  Yes Fulp, Cammie, MD  traMADol (ULTRAM) 50 MG tablet Take 100 mg by mouth every 6 (six) hours as needed for moderate pain.    Yes [provider]     Erick Colace ACNP-BC [place] Pager # [phone] OR # [phone] if no answer

## 2018-04-23 NOTE — Progress Notes (Signed)
Patient ID: Tracy Flynn, female   DOB: [birth year], 62 y.o.   MRN: [id number] Pt made DNR with no further escalation of care per CCM note; imaging reviewed by Dr. Laurence Ferrari. Left lung (frozen) not likely to re-expand regardless of any interventions as large malignant effusion has chronically been occupying majority of left pleural space; pleurx cath can be removed if needed- plan per CCM.

## 2018-04-23 NOTE — Progress Notes (Signed)
Subjective: This is a 62 y.o., female admitted on 03/10/2018 as a transfer from the floor following a respiratory arrest requiring intubation and MV. Metastatic ovarian cancer, with recurrent ascites and malignant left effusion.   Objective: BP 117/77 (BP Location: Left Arm)   Pulse (!) 103   Temp 97.8 F (36.6 C) (Oral)   Resp 20   Ht 5\' 6"  (1.676 m)   Wt 66.2 kg   SpO2 100%   BMI 23.57 kg/m    Intake/Output Summary (Last 24 hours) at 2018/04/20 0927 Last data filed at 04/20/18 0600 Gross per 24 hour  Intake 1857.25 ml  Output -  Net 1857.25 ml   Vent Mode: PRVC FiO2 (%):  [100 %] 100 % Set Rate:  [20 bmp] 20 bmp Vt Set:  [470 mL] 470 mL PEEP:  [5 cmH20] 5 cmH20 Plateau Pressure:  [25 cmH20] 25 cmH20  Physical Examination: General appearance: 62 y.o., female, intubated on MV, comatose  Eyes: anicteric sclerae, pupils reactive  HENT: NCAT; ETT in place  Neck: Trachea midline Lungs: diminshe/ near absent breath sounds on the left  CV: RRR, S1, S2, LLSB systolic murmur Abdomen: largely distended abdomen  Extremities: significant BL LE edema   Skin: Normal temperature, edematous  Psych: unable to assess  Neuro: comatose, does have spontaneous movements of limbs     Recent Labs  Lab 03/21/18 1028 03/23/18 1208 04-20-2018 0814  NA 134* 131* 131*  K 5.2* 5.9* >7.5*  CL 103 102 102  CO2 23 18* 14*  BUN 44* 57* 60*  CREATININE 1.44* 2.46* 2.96*  GLUCOSE 74 89 152*   @LABRCNTIP (calcium:3,mg:3,phos:3) Recent Labs  Lab 03/20/18 0823 03/23/18 1048 April 20, 2018 0633  WBC 23.8* 34.6* 29.2*  HGB 13.0 12.7 13.0  HCT 41.8 40.7 40.5  PLT 595* 637* 453*   Recent Labs  Lab 03/22/18 0424  INR 1.15   No results for input(s): LATICACIDVEN, PROCALCITON, O2SATVEN in the last 168 hours. Recent Labs  Lab April 20, 2018 0716  PHART 6.957*  PCO2ART 64.3*  PO2ART CRITICAL RESULT CALLED TO, READ BACK BY AND VERIFIED WITH:   Recent Labs  Lab 03/23/18 1208  AST 57*  ALT 47*    ALKPHOS 1,050*  BILITOT 0.8  ALBUMIN 1.4*   No results for input(s): TROPONINI, PROBNP in the last 168 hours.  Chest Imaging:  CXR - reviewed in epic, expanding PTX post intubation  The patient's images have been independently reviewed by me.    Assessment:  Acute hypoxemic respiratory failure  S/p respiratory arrest, recent pleurex placement, for recurrent malignant effusion, post film with possible ptx exvacuo.  Severe hypoglycemia on POC.  Found to have low blood glucose.   Expanding Left sided pneumothorax  Attachment of pleurex tubing to negative pressure bottle revealed fluid and air from the chest.  CXR s/p intubation with expanding PTx on the left   Shock, hypotensive, secondary to above   Hyperkalemic   Metastatic Ovarian Cancer, Recurrent left malignant effusion, recurrent ascites  Prior revoked DNR status   Plan:  She received pushes of D50 Fluid Boluses given  Two negative pressure bottles cycled through the pleurex and adapter attached to the pleurex to connect to an atrium and wall suction.  Low dose NEpi started.  All initial resusitative efforts completed in the ICU at bedside.  Care coordinated with nursing.   Long discussion with multiple family members in the conference room outside of the ICU. After further discussion and understanding of the severity of the patients illness  and the fact he disease process is terminal and that she is unlikely to survive this. The decision was made for NO ESCALATION of care and to make her a DNR.  Once family is ready we will make plan for palliative withdrawal from life support to allow her to pass peacefully.   This patient is critically ill with multiple organ system failure; which, requires frequent high complexity decision making, assessment, support, evaluation, and titration of therapies. This was completed through the application of advanced monitoring technologies and extensive interpretation of multiple  databases. During this encounter critical care time was devoted to patient care services described in this note for 40 minutes.  Garner Nash, DO [facility] 17-Apr-2018 9:27 AM  Personal pager: [phone] If unanswered, please page CCM On-call: [phone]

## 2018-04-23 NOTE — Progress Notes (Addendum)
PT Cancellation Note  Patient Details Name: Tracy Flynn MRN: [id number] DOB: Jul 31, 1955   Cancelled Treatment:    Reason Eval/Treat Not Completed: Medical issues which prohibited therapy--transferred to ICU/intubated. Will sign off at this time.    Weston Anna, PT Acute Rehabilitation Services Pager: [phone] Office: [phone]

## 2018-04-23 DEATH — deceased

## 2018-10-07 IMAGING — CR DG CHEST 2V
2 series · 2 of 2 positions shown · non-contrast
Comparison: Chest radiograph December 11, 2017

CLINICAL DATA: Shortness of breath, LEFT chest pain and cough.

EXAM:
CHEST - 2 VIEW

[chest pa]
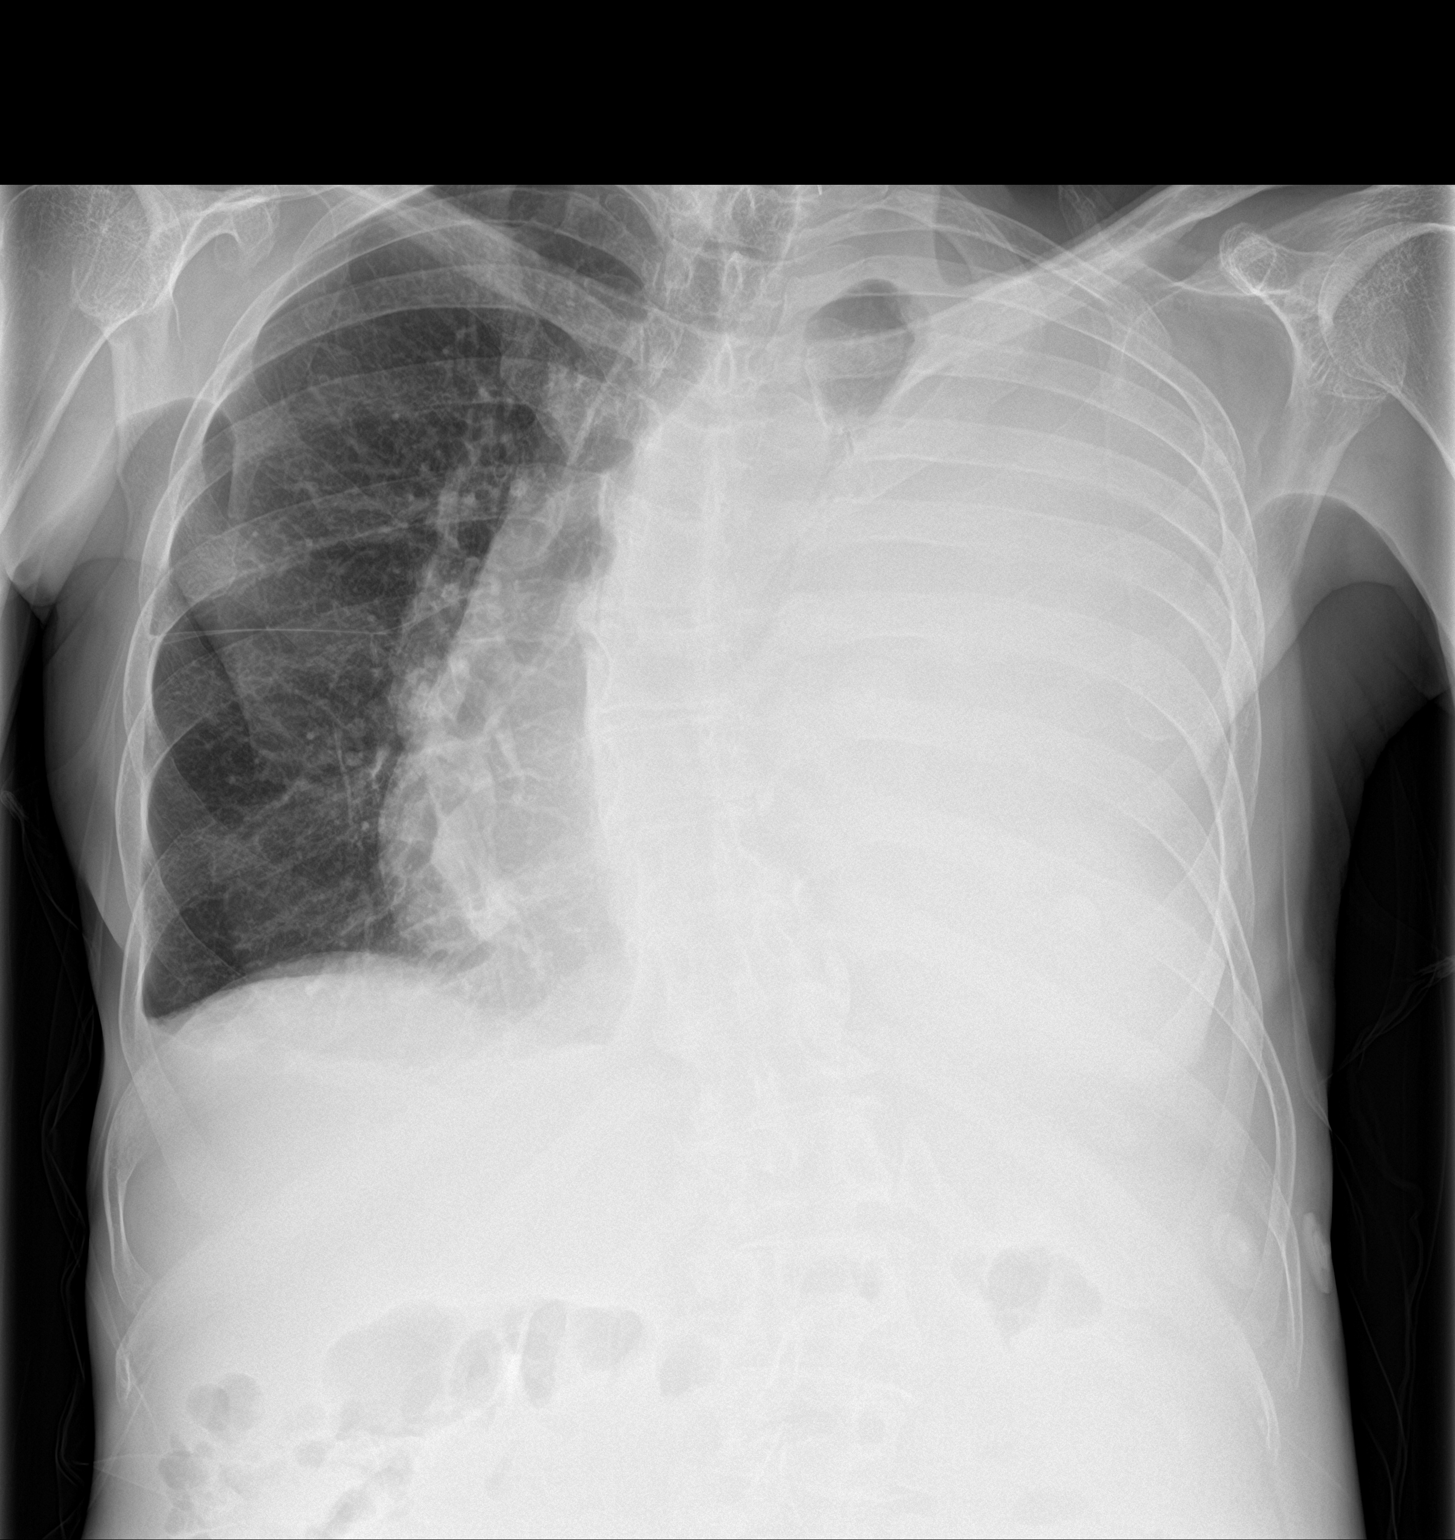

[chest lat]
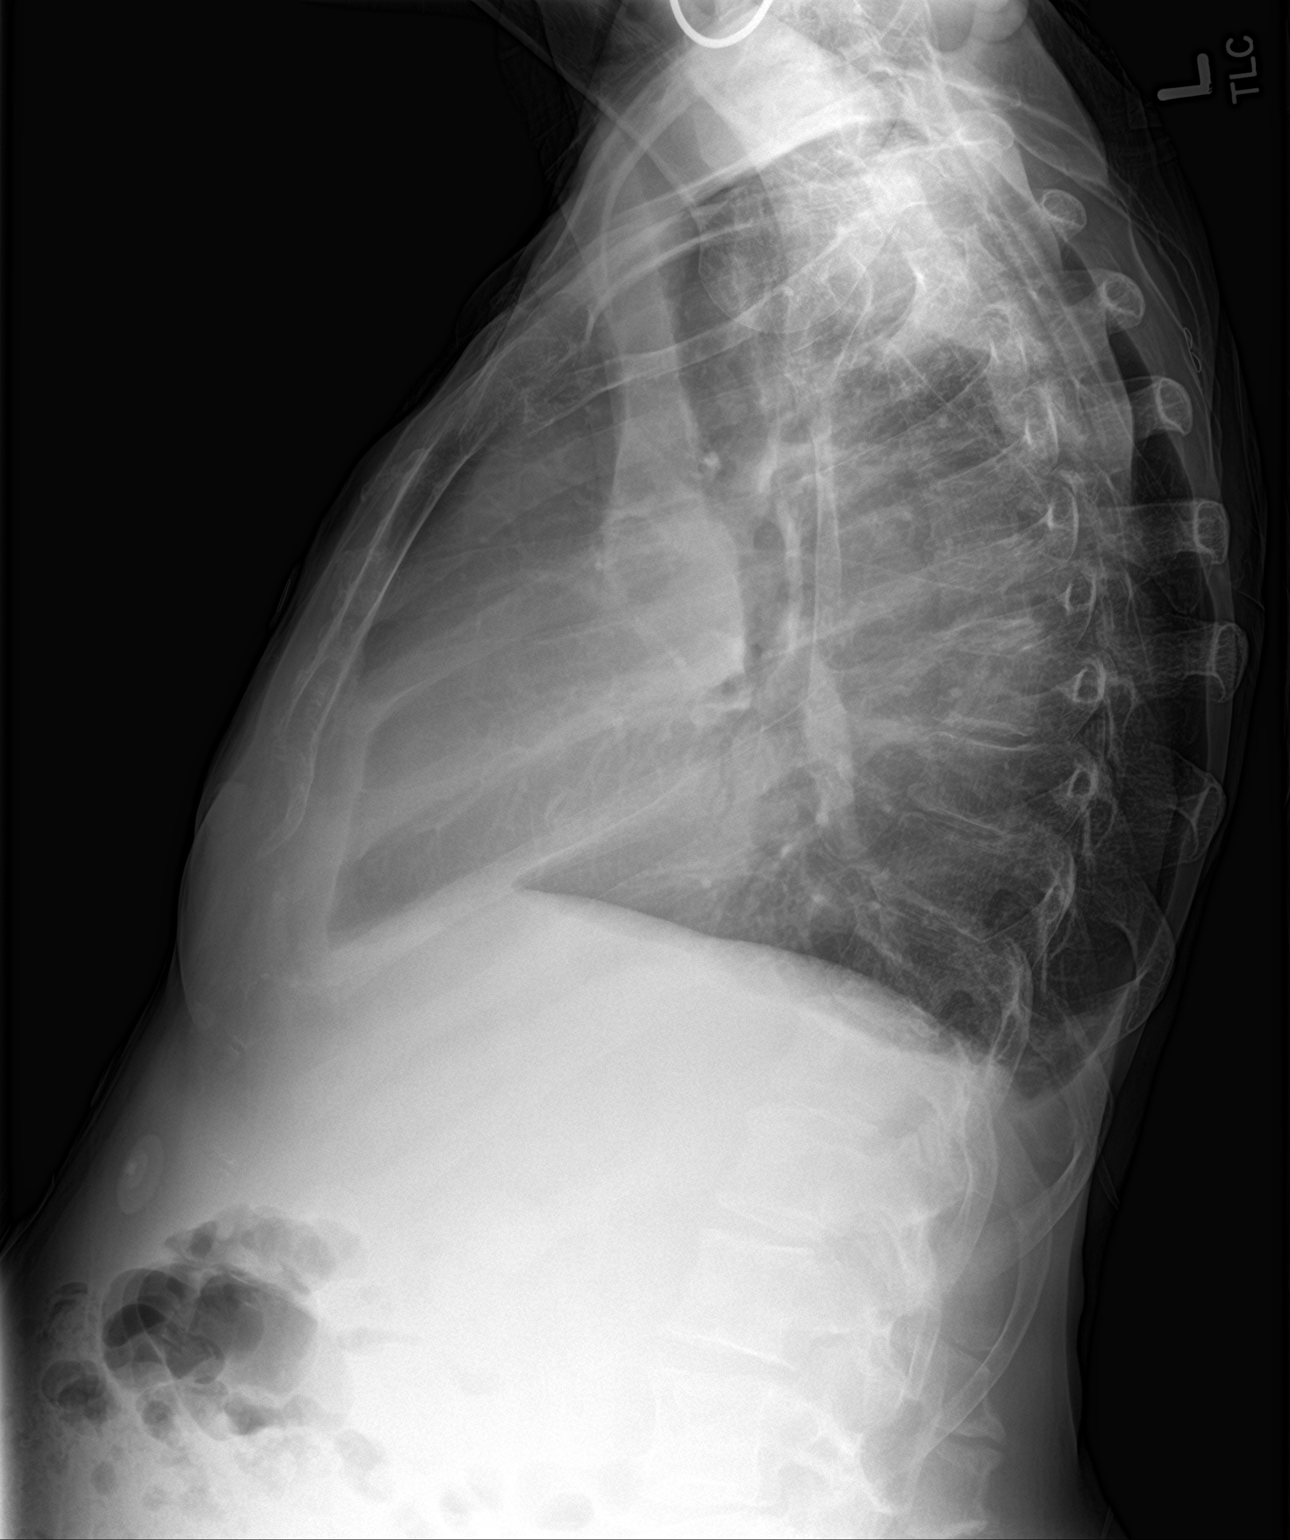

[2 of 2 positions shown; findings below may reference images not displayed]

FINDINGS: Increased large LEFT pleural effusion with mediastinal shift to the
RIGHT. Dense underlying consolidation. RIGHT lung is clear. The
heart borders obscured on LEFT. No pneumothorax. Soft tissue planes
and included osseous structures are non suspicious. Staple
projecting LEFT neck is likely superficial to patient.
IMPRESSION: Re-accumulation of large LEFT pleural effusion with mediastinal
shift to the RIGHT. Underlying consolidation or mass is possible.

Acute findings discussed with and reconfirmed by Dr.NALA TIGER on

## 2018-10-08 IMAGING — US IR THORACENTESIS ASP PLEURAL SPACE W/IMG GUIDE
1 series · 2 of 2 positions shown · non-contrast
Comparison: none

INDICATION: History of pelvic cystic mass concerning for ovarian neoplasm,
ascites, liver lesions, and large left pleural effusion. Request for
therapeutic thoracentesis.

[Series 1: ir (id) (id)/(id)/(id) ir · 2 of 2 slices shown]
[im 1/2]
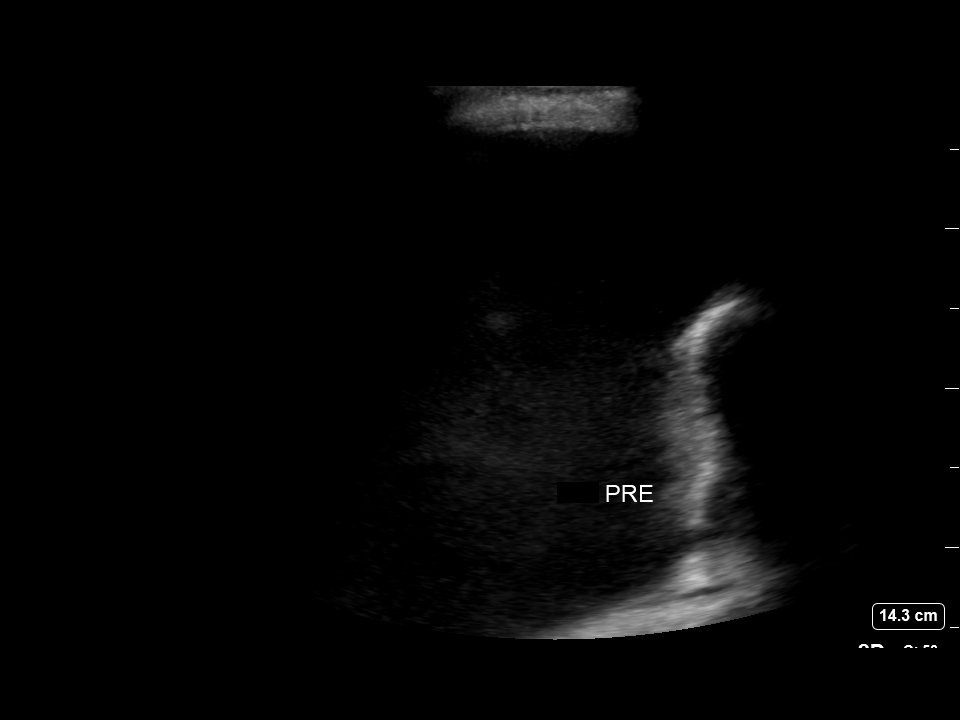
[im 2/2]
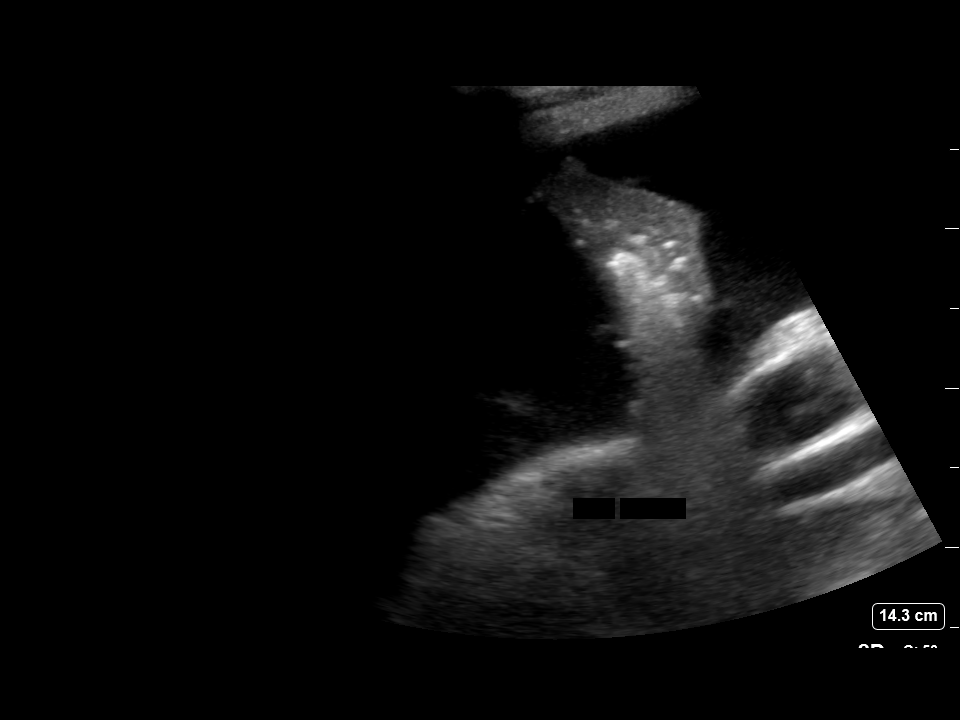

[2 of 2 positions shown; findings below may reference images not displayed]

EXAM:
ULTRASOUND GUIDED LEFT THORACENTESIS

MEDICATIONS:
1% lidocaine 10 mL

COMPLICATIONS:
None immediate.

PROCEDURE:
An ultrasound guided thoracentesis was thoroughly discussed with the
patient and questions answered. The benefits, risks, alternatives
and complications were also discussed. The patient understands and
wishes to proceed with the procedure. Written consent was obtained.

Ultrasound was performed to localize and mark an adequate pocket of
fluid in the left chest. The area was then prepped and draped in the
normal sterile fashion. 1% Lidocaine was used for local anesthesia.
Under ultrasound guidance a 6 Fr Safe-T-Centesis catheter was
introduced. Thoracentesis was performed. The catheter was removed
and a dressing applied.
FINDINGS: A total of approximately 2.3 L of bloody fluid was removed.
IMPRESSION: Successful ultrasound guided left thoracentesis yielding 2.3 L of
pleural fluid.

No pneumothorax on post procedure chest x-ray.

## 2018-11-25 IMAGING — US US THORACENTESIS ASP PLEURAL SPACE W/IMG GUIDE
1 series · 5 of 5 positions shown · non-contrast
Comparison: none

INDICATION: Recurrent symptomatic pleural effusion; history of metastatic
ovarian cancer. Request for therapeutic thoracentesis.

[Series 1: us thoracentesis asp pleural space w/img guide · 5 of 5 slices shown]
[im 1/5]
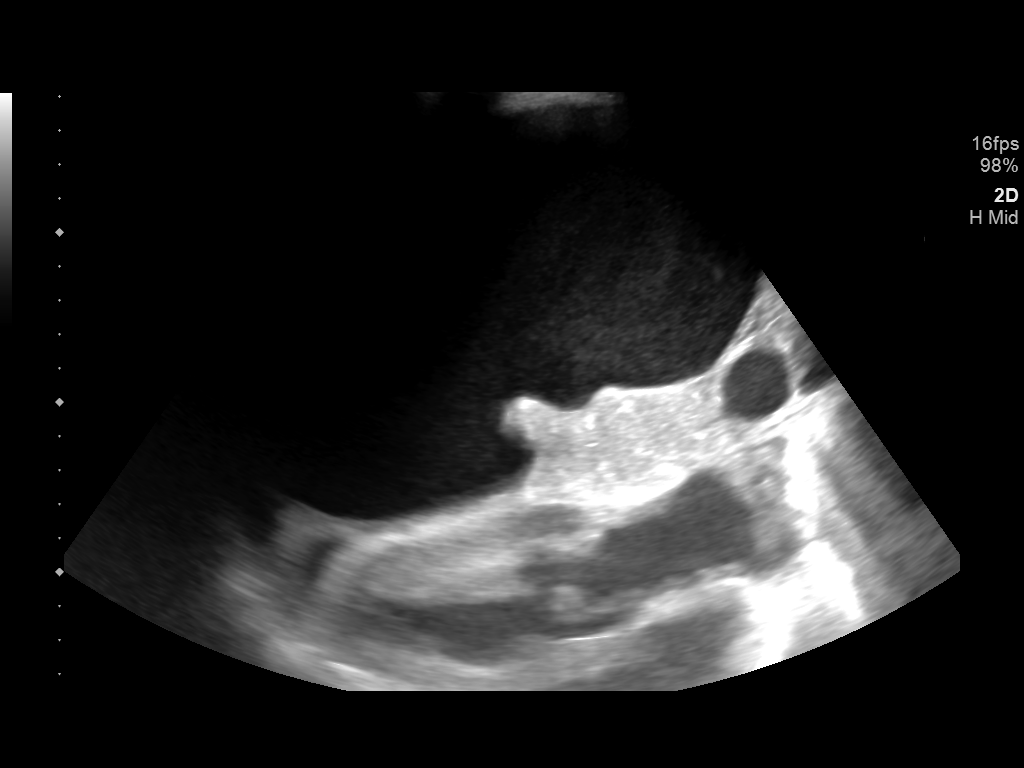
[im 2/5]
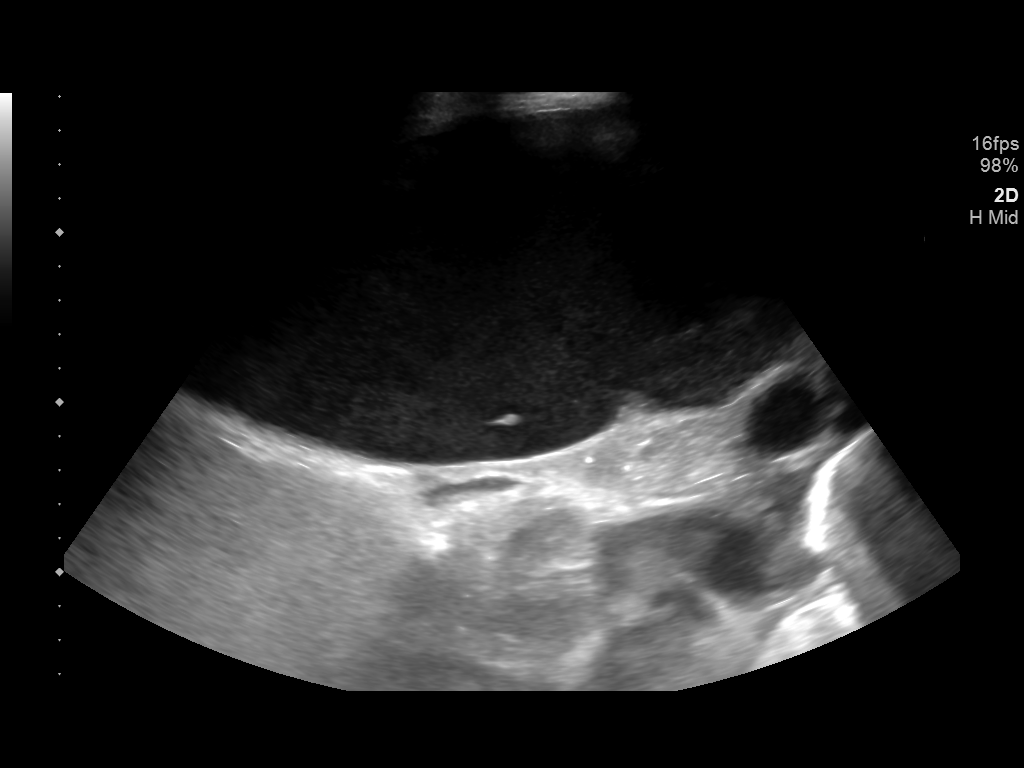
[im 3/5]
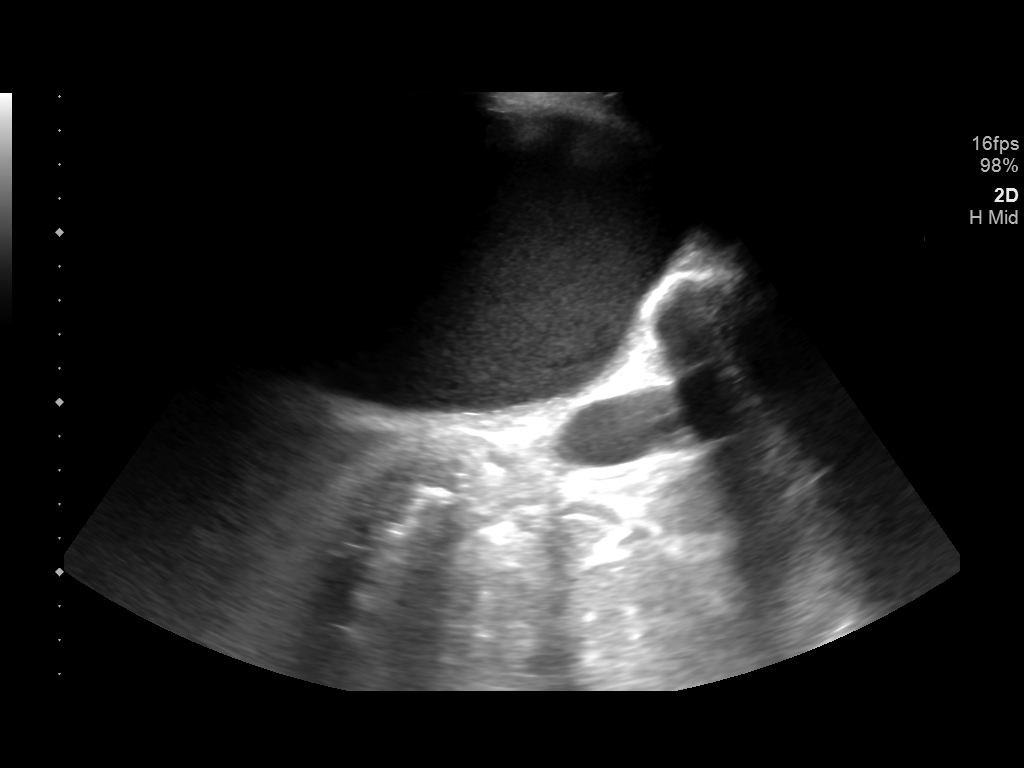
[im 4/5]
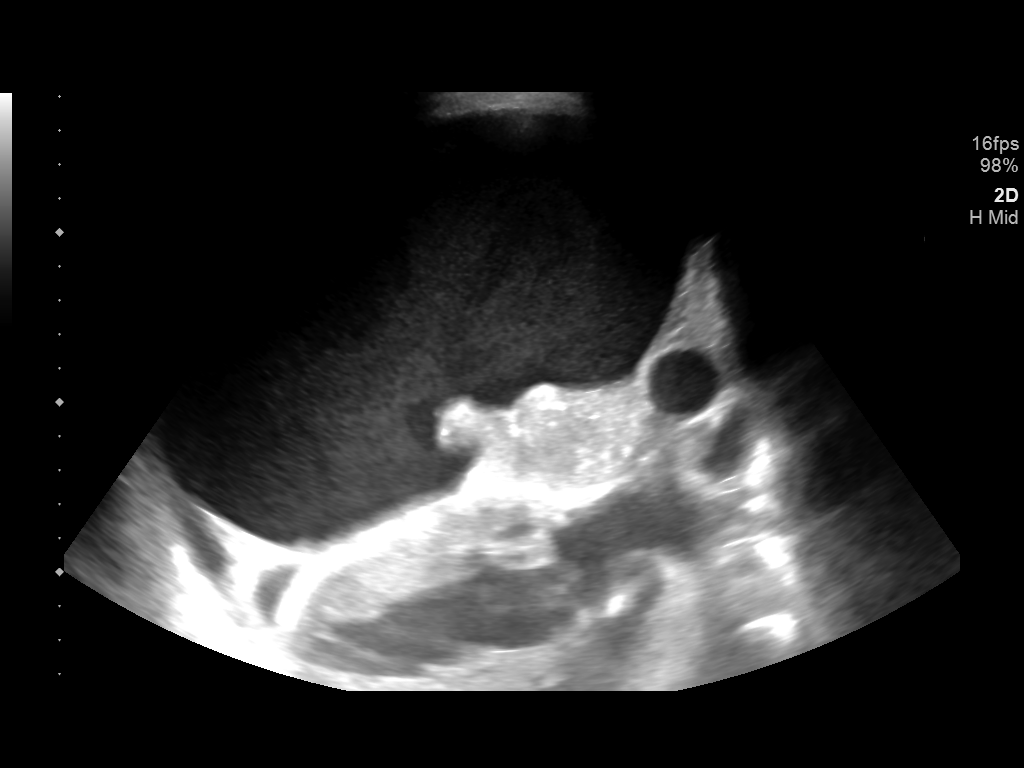
[im 5/5]
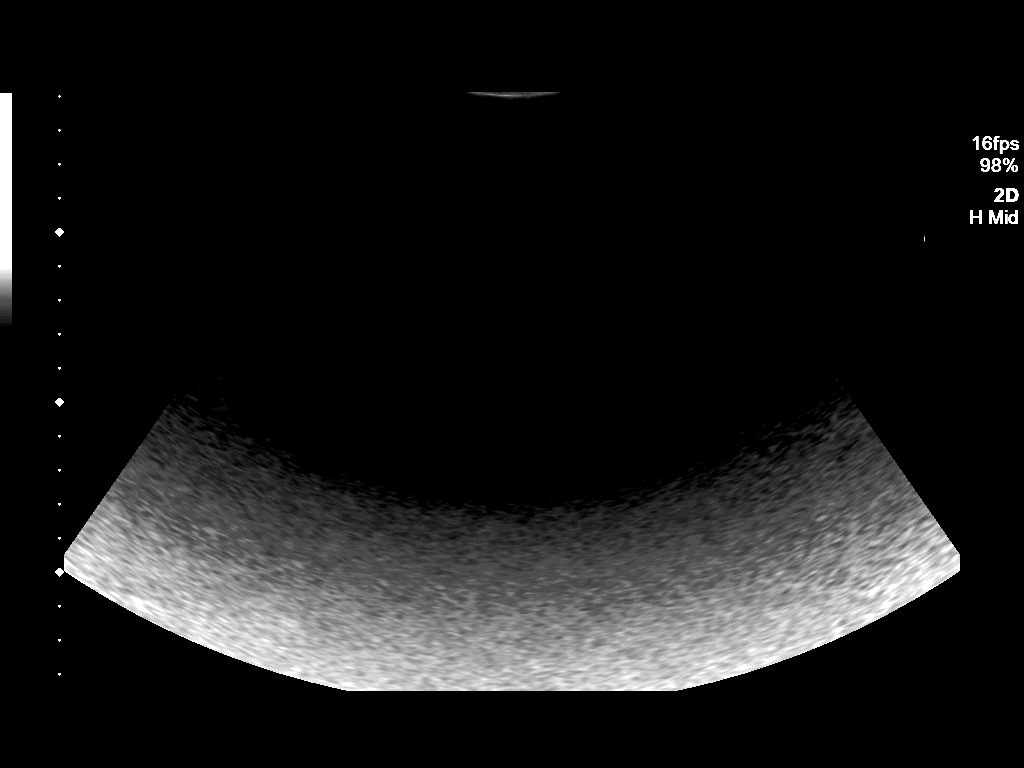

[5 of 5 positions shown; findings below may reference images not displayed]

EXAM:
ULTRASOUND GUIDED LEFT THORACENTESIS

MEDICATIONS:
10 mL 1% lidocaine

COMPLICATIONS:
None immediate.

PROCEDURE:
An ultrasound guided thoracentesis was thoroughly discussed with the
patient and questions answered. The benefits, risks, alternatives
and complications were also discussed. The patient understands and
wishes to proceed with the procedure. Written consent was obtained.

Ultrasound was performed to localize and mark an adequate pocket of
fluid in the left chest. The area was then prepped and draped in the
normal sterile fashion. 1% Lidocaine was used for local anesthesia.
Under ultrasound guidance a 6 Fr Safe-T-Centesis catheter was
introduced. Thoracentesis was performed. The catheter was removed
and a dressing applied.
FINDINGS: A total of approximately 1 L of hazy tan fluid was removed.
IMPRESSION: Successful ultrasound guided left thoracentesis yielding 1 L of
pleural fluid.

Read by Chak Lam, Wing Kwok

## 2018-12-30 IMAGING — US IR ABSCESS DRAINAGE
1 series · 1 of 1 positions shown · non-contrast
Comparison: none

CLINICAL DATA: Malignant recurrent large left pleural effusion.

[Series 1: ir rad eval and mgt. · 1 of 1 slices shown]
[im 1/1]
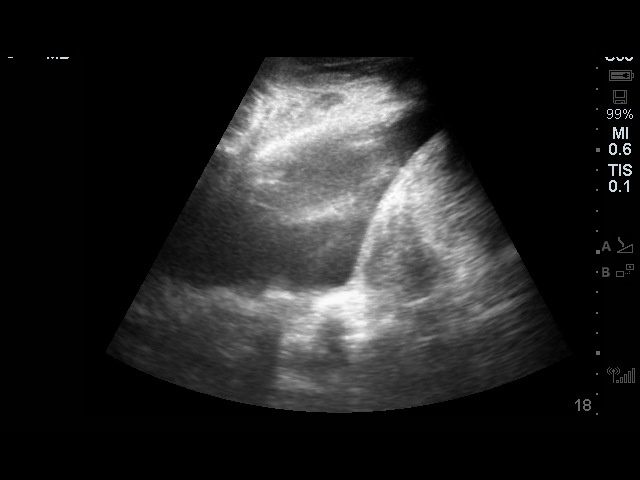

[1 of 1 positions shown; findings below may reference images not displayed]

EXAM:
INSERTION OF TUNNELED PLEURAL DRAINAGE CATHETER

ANESTHESIA/SEDATION:
Intravenous Fentanyl and Versed were administered as conscious
sedation during continuous monitoring of the patient's level of
consciousness and physiological / cardiorespiratory status by the
radiology RN, with a total moderate sedation time of 15 minutes.

MEDICATIONS:
As antibiotic prophylaxis, clindamycin 900 mg was ordered
pre-procedure and administered intravenously within one hour of
incision.

FLUOROSCOPY TIME:  Less than 0.1 minute; 5  uPymE DAP

PROCEDURE:
The procedure, risks, benefits, and alternatives were explained to
the patient. Questions regarding the procedure were encouraged and
answered.

The patient understands and consents to the procedure.

Survey ultrasound of the left chest was performed and an appropriate
skin entry site was determined and marked.

The left chest wall was prepped with Betadine in a sterile fashion,
and a sterile drape was applied covering the operative field. A
sterile gown and sterile gloves were used for the procedure. Local
anesthesia was provided with 1% Lidocaine. Ultrasound image
documentation was performed. Fluoroscopy during the procedure and
fluoroscopic spot radiograph confirms appropriate catheter position.

After creating a small skin incision, a 19 gauge needle was advanced
into the pleural cavity under ultrasound guidance. A guide wire was
then advanced under fluoroscopy into the pleural space. Pleural
access was dilated serially and a 16-French peel-away sheath placed.

A 16 French tunneled PleurX catheter was placed. This was tunneled
from an incision 5 cm below the pleural access to the access site.
The catheter was advanced through the peel-away sheath. The sheath
was then removed. Final catheter positioning was confirmed with a
fluoroscopic spot image.

The access incision was closed with Dermabond was applied to the
incision. A Prolene retention suture was applied at the catheter
exit site. Large volume thoracentesis was performed through the new
catheter utilizing gravity drainage bag.

COMPLICATIONS:
None.
FINDINGS: The catheter was placed via the left lateral chest wall. Catheter
course is towards the apex. Approximately 900 mL of pleural fluid
was able to be removed after catheter placement. The patient will be
brought back in 10 to 80days to remove the temporary exit site
retention suture after appropriate in-growth around the subcutaneous
cuff of the catheter.
IMPRESSION: Placement of permanent, tunneled left pleural drainage catheter via
lateral approach. 900 mL of pleural fluid was removed today after
catheter placement.

## 2018-12-31 IMAGING — DX DG CHEST 1V PORT
1 series · 3 of 3 positions shown · non-contrast
Comparison: 03/19/2018

CLINICAL DATA: Follow-up pleural effusions.

EXAM:
PORTABLE CHEST 1 VIEW

[Series 1: chest ap · U · 0.12mm/px · 3 of 3 slices shown]
[im 1/3]
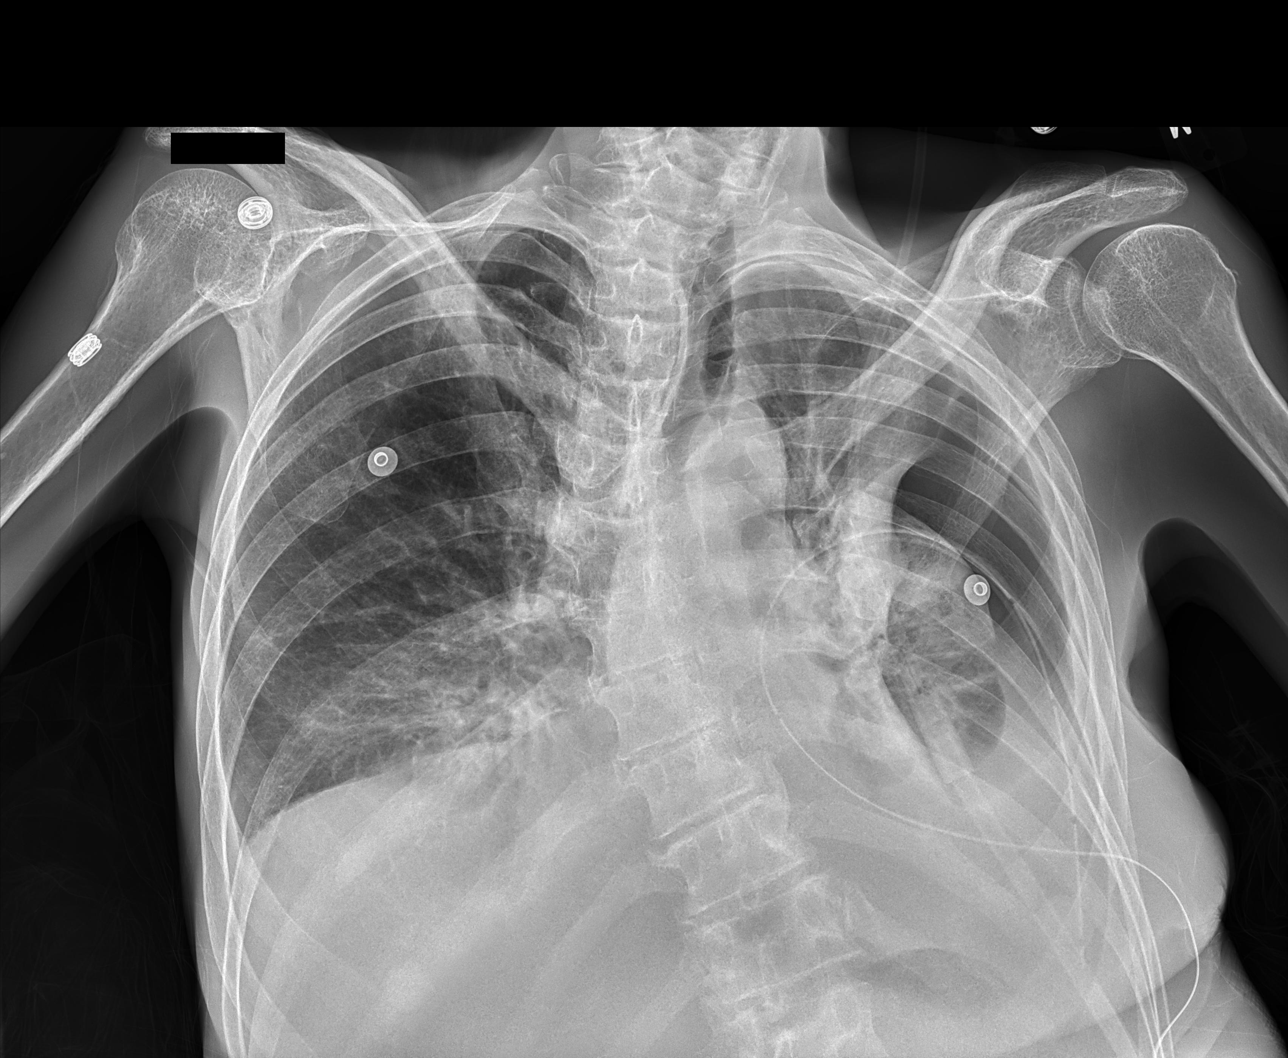
[im 2/3]
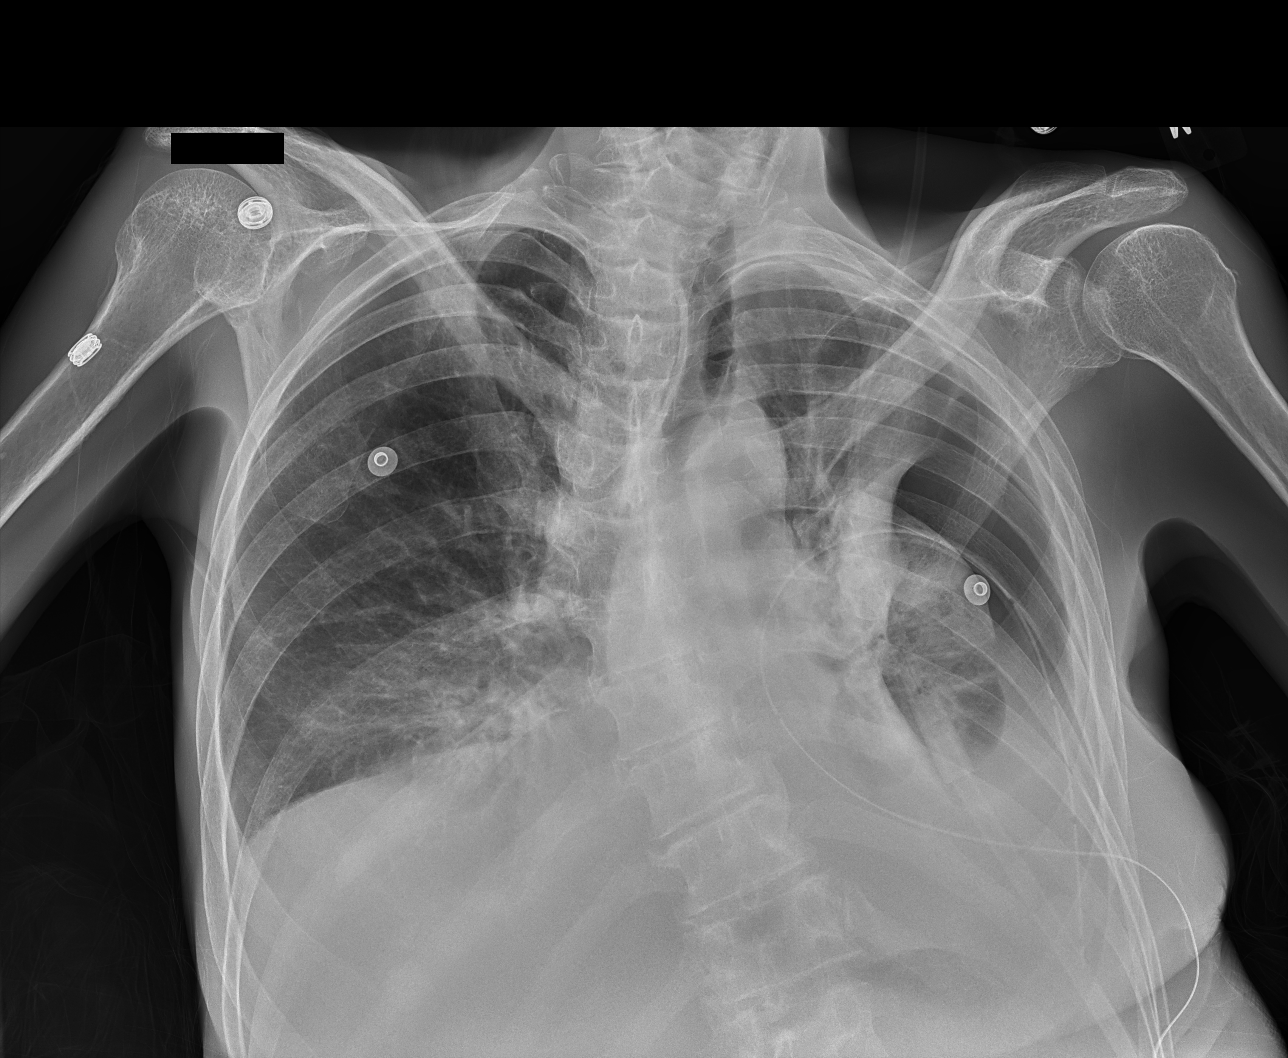
[im 3/3]
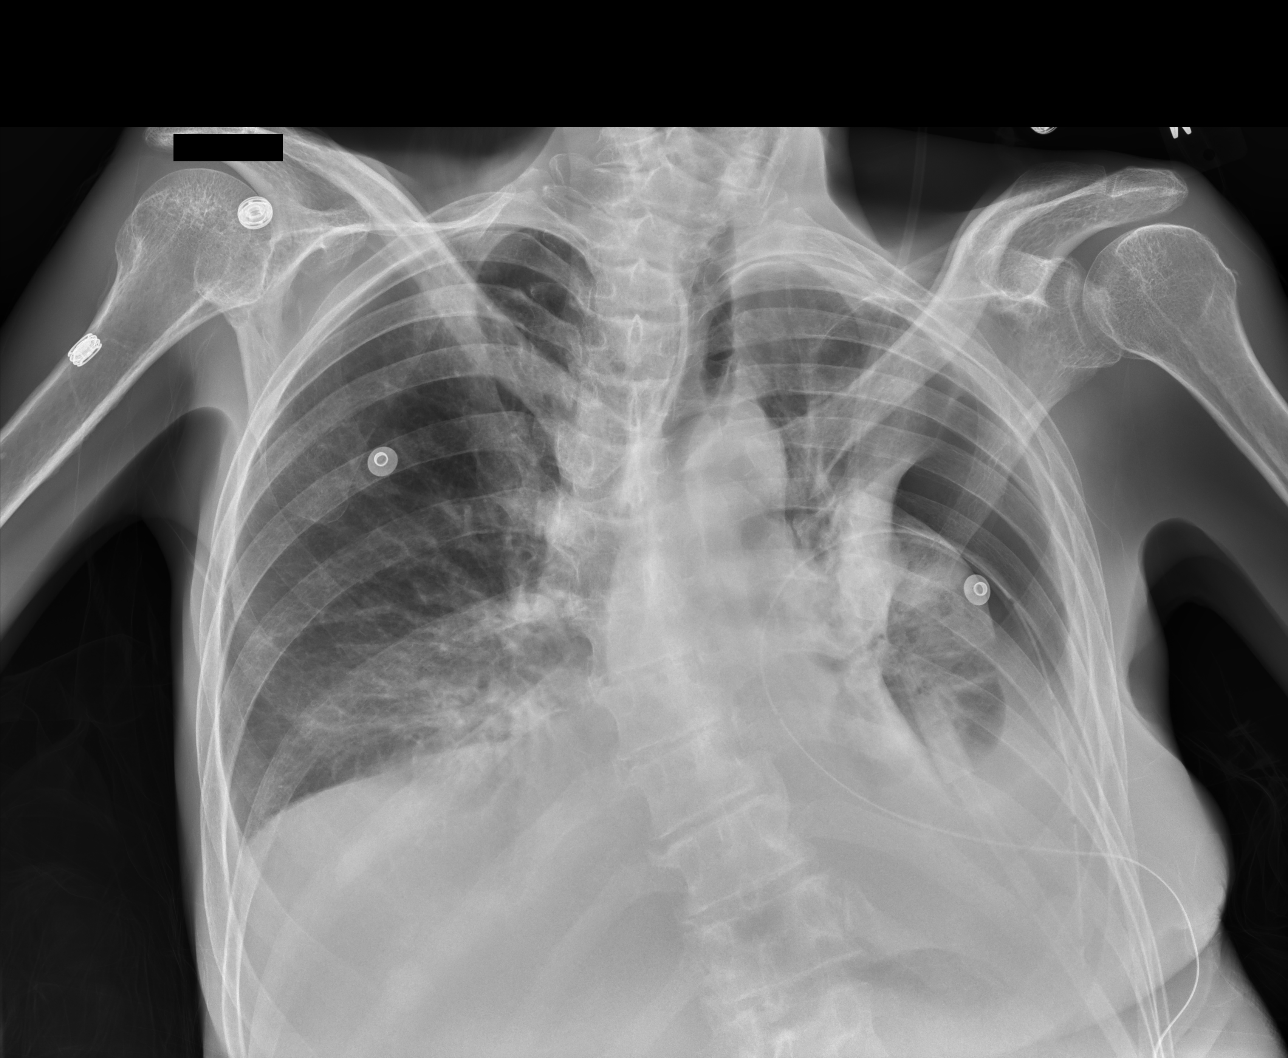

[3 of 3 positions shown; findings below may reference images not displayed]

FINDINGS: Patient is rotated to the left. Examination demonstrates interval
placement of left pleural drainage catheter with tip over the left
lung base. There is significant improvement in the previously noted
left effusion with small residual left pleural effusion noted. Left
basilar atelectasis. Evidence of moderate size left pneumothorax.
Minimal prominence of the right perihilar markings. Remainder the
exam is unchanged.
IMPRESSION: Significant improvement in the left pleural effusion with small
residual left pleural effusion and left basilar atelectasis post
placement of left pleural drainage catheter. Evidence of moderate
size left pneumothorax.

These results were called by telephone at the time of interpretation
on 03/23/2018 at [DATE] to patient's nurse, Don Lolito Onacram, who verbally
acknowledged these results and will notify covering physician.
